# Patient Record
Sex: Female | Born: 1980 | Race: Black or African American | Hispanic: No | Marital: Married | Smoking: Never smoker
Health system: Southern US, Community
[De-identification: ages and names within clinical notes are randomized; demographics above are authoritative.]

## PROBLEM LIST (undated history)

## (undated) ENCOUNTER — Inpatient Hospital Stay (HOSPITAL_COMMUNITY): Payer: Self-pay

## (undated) DIAGNOSIS — E782 Mixed hyperlipidemia: Secondary | ICD-10-CM

## (undated) DIAGNOSIS — K920 Hematemesis: Secondary | ICD-10-CM

## (undated) DIAGNOSIS — E785 Hyperlipidemia, unspecified: Secondary | ICD-10-CM

## (undated) DIAGNOSIS — K449 Diaphragmatic hernia without obstruction or gangrene: Secondary | ICD-10-CM

## (undated) DIAGNOSIS — J45909 Unspecified asthma, uncomplicated: Secondary | ICD-10-CM

## (undated) DIAGNOSIS — R7303 Prediabetes: Secondary | ICD-10-CM

## (undated) DIAGNOSIS — N939 Abnormal uterine and vaginal bleeding, unspecified: Secondary | ICD-10-CM

## (undated) DIAGNOSIS — K219 Gastro-esophageal reflux disease without esophagitis: Secondary | ICD-10-CM

## (undated) DIAGNOSIS — D500 Iron deficiency anemia secondary to blood loss (chronic): Secondary | ICD-10-CM

## (undated) DIAGNOSIS — Z973 Presence of spectacles and contact lenses: Secondary | ICD-10-CM

## (undated) DIAGNOSIS — E7800 Pure hypercholesterolemia, unspecified: Secondary | ICD-10-CM

## (undated) DIAGNOSIS — J4530 Mild persistent asthma, uncomplicated: Secondary | ICD-10-CM

## (undated) DIAGNOSIS — E559 Vitamin D deficiency, unspecified: Secondary | ICD-10-CM

## (undated) DIAGNOSIS — R12 Heartburn: Secondary | ICD-10-CM

## (undated) DIAGNOSIS — D649 Anemia, unspecified: Secondary | ICD-10-CM

## (undated) DIAGNOSIS — T7840XA Allergy, unspecified, initial encounter: Secondary | ICD-10-CM

## (undated) HISTORY — DX: Unspecified asthma, uncomplicated: J45.909

## (undated) HISTORY — DX: Pure hypercholesterolemia, unspecified: E78.00

## (undated) HISTORY — PX: OTHER SURGICAL HISTORY: SHX169

## (undated) HISTORY — DX: Anemia, unspecified: D64.9

## (undated) HISTORY — DX: Allergy, unspecified, initial encounter: T78.40XA

## (undated) HISTORY — PX: CHOLECYSTECTOMY: SHX55

---
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
## 2002-08-19 ENCOUNTER — Other Ambulatory Visit: Admission: RE | Admit: 2002-08-19 | Discharge: 2002-08-19 | Payer: Self-pay | Admitting: Obstetrics and Gynecology

## 2003-06-26 DIAGNOSIS — Z87410 Personal history of cervical dysplasia: Secondary | ICD-10-CM

## 2003-06-26 HISTORY — DX: Personal history of cervical dysplasia: Z87.410

## 2003-09-06 ENCOUNTER — Other Ambulatory Visit: Admission: RE | Admit: 2003-09-06 | Discharge: 2003-09-06 | Payer: Self-pay | Admitting: Obstetrics and Gynecology

## 2003-10-26 ENCOUNTER — Ambulatory Visit (HOSPITAL_COMMUNITY): Admission: RE | Admit: 2003-10-26 | Discharge: 2003-10-26 | Payer: Self-pay | Admitting: Family Medicine

## 2003-12-14 ENCOUNTER — Ambulatory Visit (HOSPITAL_COMMUNITY): Admission: RE | Admit: 2003-12-14 | Discharge: 2003-12-14 | Payer: Self-pay | Admitting: Obstetrics and Gynecology

## 2003-12-14 ENCOUNTER — Ambulatory Visit (HOSPITAL_BASED_OUTPATIENT_CLINIC_OR_DEPARTMENT_OTHER): Admission: RE | Admit: 2003-12-14 | Discharge: 2003-12-14 | Payer: Self-pay | Admitting: Obstetrics and Gynecology

## 2003-12-14 HISTORY — PX: LASER ABLATION OF THE CERVIX: SHX1949

## 2004-02-10 ENCOUNTER — Emergency Department (HOSPITAL_COMMUNITY): Admission: EM | Admit: 2004-02-10 | Discharge: 2004-02-11 | Payer: Self-pay | Admitting: Emergency Medicine

## 2005-01-29 ENCOUNTER — Ambulatory Visit: Payer: Self-pay | Admitting: Family Medicine

## 2005-04-18 ENCOUNTER — Ambulatory Visit: Payer: Self-pay | Admitting: Family Medicine

## 2005-04-20 ENCOUNTER — Ambulatory Visit (HOSPITAL_COMMUNITY): Admission: RE | Admit: 2005-04-20 | Discharge: 2005-04-20 | Payer: Self-pay | Admitting: Family Medicine

## 2005-04-20 ENCOUNTER — Ambulatory Visit: Payer: Self-pay | Admitting: Family Medicine

## 2005-07-24 ENCOUNTER — Ambulatory Visit: Payer: Self-pay | Admitting: Family Medicine

## 2005-08-30 ENCOUNTER — Ambulatory Visit: Payer: Self-pay | Admitting: Family Medicine

## 2005-08-31 ENCOUNTER — Ambulatory Visit (HOSPITAL_COMMUNITY): Admission: RE | Admit: 2005-08-31 | Discharge: 2005-08-31 | Payer: Self-pay | Admitting: Family Medicine

## 2005-09-04 ENCOUNTER — Ambulatory Visit (HOSPITAL_COMMUNITY): Admission: RE | Admit: 2005-09-04 | Discharge: 2005-09-04 | Payer: Self-pay | Admitting: Family Medicine

## 2005-09-05 ENCOUNTER — Encounter (HOSPITAL_COMMUNITY): Admission: RE | Admit: 2005-09-05 | Discharge: 2005-10-05 | Payer: Self-pay | Admitting: Family Medicine

## 2005-09-17 ENCOUNTER — Ambulatory Visit: Payer: Self-pay | Admitting: Internal Medicine

## 2005-09-23 HISTORY — PX: ESOPHAGOGASTRODUODENOSCOPY: SHX1529

## 2005-09-24 ENCOUNTER — Ambulatory Visit: Payer: Self-pay | Admitting: Internal Medicine

## 2005-09-24 ENCOUNTER — Ambulatory Visit (HOSPITAL_COMMUNITY): Admission: RE | Admit: 2005-09-24 | Discharge: 2005-09-24 | Payer: Self-pay | Admitting: Internal Medicine

## 2005-10-24 ENCOUNTER — Ambulatory Visit: Payer: Self-pay | Admitting: Internal Medicine

## 2006-04-23 ENCOUNTER — Ambulatory Visit: Payer: Self-pay | Admitting: Family Medicine

## 2006-07-23 ENCOUNTER — Ambulatory Visit: Payer: Self-pay | Admitting: Family Medicine

## 2006-07-25 ENCOUNTER — Ambulatory Visit: Payer: Self-pay | Admitting: Family Medicine

## 2006-09-23 ENCOUNTER — Ambulatory Visit: Payer: Self-pay | Admitting: Family Medicine

## 2007-01-23 ENCOUNTER — Ambulatory Visit: Payer: Self-pay | Admitting: Family Medicine

## 2007-02-13 ENCOUNTER — Ambulatory Visit: Payer: Self-pay | Admitting: Family Medicine

## 2007-03-10 ENCOUNTER — Ambulatory Visit: Payer: Self-pay | Admitting: Family Medicine

## 2007-03-12 ENCOUNTER — Encounter: Payer: Self-pay | Admitting: Family Medicine

## 2007-05-19 ENCOUNTER — Ambulatory Visit: Payer: Self-pay | Admitting: Family Medicine

## 2007-07-08 ENCOUNTER — Ambulatory Visit: Payer: Self-pay | Admitting: Family Medicine

## 2007-07-14 ENCOUNTER — Ambulatory Visit: Payer: Self-pay | Admitting: Orthopedic Surgery

## 2007-07-14 DIAGNOSIS — M6740 Ganglion, unspecified site: Secondary | ICD-10-CM | POA: Insufficient documentation

## 2007-07-14 DIAGNOSIS — M654 Radial styloid tenosynovitis [de Quervain]: Secondary | ICD-10-CM | POA: Insufficient documentation

## 2007-07-28 ENCOUNTER — Ambulatory Visit: Payer: Self-pay | Admitting: Orthopedic Surgery

## 2008-01-19 ENCOUNTER — Ambulatory Visit: Payer: Self-pay | Admitting: Orthopedic Surgery

## 2008-01-19 DIAGNOSIS — M25549 Pain in joints of unspecified hand: Secondary | ICD-10-CM | POA: Insufficient documentation

## 2008-02-04 ENCOUNTER — Ambulatory Visit: Payer: Self-pay | Admitting: Orthopedic Surgery

## 2008-02-05 ENCOUNTER — Telehealth: Payer: Self-pay | Admitting: Family Medicine

## 2008-02-25 ENCOUNTER — Ambulatory Visit: Payer: Self-pay | Admitting: Orthopedic Surgery

## 2008-02-25 ENCOUNTER — Telehealth: Payer: Self-pay | Admitting: Family Medicine

## 2008-03-04 ENCOUNTER — Ambulatory Visit (HOSPITAL_COMMUNITY): Admission: RE | Admit: 2008-03-04 | Discharge: 2008-03-04 | Payer: Self-pay | Admitting: Orthopedic Surgery

## 2008-03-04 ENCOUNTER — Telehealth: Payer: Self-pay | Admitting: Orthopedic Surgery

## 2008-03-10 ENCOUNTER — Ambulatory Visit: Payer: Self-pay | Admitting: Orthopedic Surgery

## 2008-03-10 DIAGNOSIS — M65849 Other synovitis and tenosynovitis, unspecified hand: Secondary | ICD-10-CM

## 2008-03-10 DIAGNOSIS — M65839 Other synovitis and tenosynovitis, unspecified forearm: Secondary | ICD-10-CM | POA: Insufficient documentation

## 2008-03-17 ENCOUNTER — Telehealth: Payer: Self-pay | Admitting: Orthopedic Surgery

## 2008-03-17 ENCOUNTER — Ambulatory Visit: Payer: Self-pay | Admitting: Orthopedic Surgery

## 2008-03-21 ENCOUNTER — Emergency Department (HOSPITAL_COMMUNITY): Admission: EM | Admit: 2008-03-21 | Discharge: 2008-03-22 | Payer: Self-pay | Admitting: Emergency Medicine

## 2008-03-26 ENCOUNTER — Ambulatory Visit: Payer: Self-pay | Admitting: Family Medicine

## 2008-04-08 ENCOUNTER — Ambulatory Visit: Payer: Self-pay | Admitting: Orthopedic Surgery

## 2008-05-10 ENCOUNTER — Telehealth: Payer: Self-pay | Admitting: Orthopedic Surgery

## 2008-06-14 ENCOUNTER — Telehealth: Payer: Self-pay | Admitting: Family Medicine

## 2008-07-08 ENCOUNTER — Telehealth: Payer: Self-pay | Admitting: Family Medicine

## 2008-07-09 ENCOUNTER — Ambulatory Visit: Payer: Self-pay | Admitting: Family Medicine

## 2008-07-09 LAB — CONVERTED CEMR LAB
BUN: 11 mg/dL (ref 6–23)
Basophils Absolute: 0 10*3/uL (ref 0.0–0.1)
CO2: 27 meq/L (ref 19–32)
Creatinine, Ser: 0.91 mg/dL (ref 0.40–1.20)
Eosinophils Absolute: 0 10*3/uL (ref 0.0–0.7)
Eosinophils Relative: 0 % (ref 0–5)
Lymphocytes Relative: 11 % — ABNORMAL LOW (ref 12–46)
Lymphs Abs: 0.7 10*3/uL (ref 0.7–4.0)
MCV: 76.2 fL — ABNORMAL LOW (ref 78.0–100.0)
Neutrophils Relative %: 76 % (ref 43–77)
Platelets: 213 10*3/uL (ref 150–400)
RDW: 14.7 % (ref 11.5–15.5)
WBC: 6.4 10*3/uL (ref 4.0–10.5)

## 2008-07-13 ENCOUNTER — Ambulatory Visit: Payer: Self-pay | Admitting: Family Medicine

## 2008-07-13 LAB — CONVERTED CEMR LAB
Blood in Urine, dipstick: NEGATIVE
Nitrite: NEGATIVE
Protein, U semiquant: 100
Specific Gravity, Urine: >=1.03
WBC Urine, dipstick: NEGATIVE

## 2008-07-14 ENCOUNTER — Encounter: Payer: Self-pay | Admitting: Family Medicine

## 2008-08-05 ENCOUNTER — Ambulatory Visit: Payer: Self-pay | Admitting: Family Medicine

## 2008-08-05 DIAGNOSIS — R5381 Other malaise: Secondary | ICD-10-CM | POA: Insufficient documentation

## 2008-08-05 DIAGNOSIS — R5383 Other fatigue: Secondary | ICD-10-CM

## 2008-08-16 ENCOUNTER — Telehealth: Payer: Self-pay | Admitting: Family Medicine

## 2008-08-24 ENCOUNTER — Encounter: Payer: Self-pay | Admitting: Family Medicine

## 2008-11-08 ENCOUNTER — Ambulatory Visit: Payer: Self-pay | Admitting: Family Medicine

## 2008-11-09 DIAGNOSIS — R21 Rash and other nonspecific skin eruption: Secondary | ICD-10-CM | POA: Insufficient documentation

## 2008-11-15 ENCOUNTER — Telehealth: Payer: Self-pay | Admitting: Family Medicine

## 2009-01-28 ENCOUNTER — Ambulatory Visit: Payer: Self-pay | Admitting: Family Medicine

## 2009-01-28 DIAGNOSIS — E669 Obesity, unspecified: Secondary | ICD-10-CM | POA: Insufficient documentation

## 2009-01-28 DIAGNOSIS — J019 Acute sinusitis, unspecified: Secondary | ICD-10-CM | POA: Insufficient documentation

## 2009-01-28 DIAGNOSIS — J209 Acute bronchitis, unspecified: Secondary | ICD-10-CM | POA: Insufficient documentation

## 2009-01-29 ENCOUNTER — Encounter: Payer: Self-pay | Admitting: Family Medicine

## 2009-01-31 LAB — CONVERTED CEMR LAB
BUN: 11 mg/dL (ref 6–23)
Calcium: 9.1 mg/dL (ref 8.4–10.5)
Cholesterol: 179 mg/dL (ref 0–200)
Glucose, Bld: 74 mg/dL (ref 70–99)
LDL Cholesterol: 122 mg/dL — ABNORMAL HIGH (ref 0–99)
Total CHOL/HDL Ratio: 4.2
VLDL: 14 mg/dL (ref 0–40)

## 2009-07-18 ENCOUNTER — Telehealth: Payer: Self-pay | Admitting: Orthopedic Surgery

## 2009-07-19 ENCOUNTER — Ambulatory Visit: Payer: Self-pay | Admitting: Orthopedic Surgery

## 2009-07-22 ENCOUNTER — Ambulatory Visit: Payer: Self-pay | Admitting: Orthopedic Surgery

## 2009-07-22 ENCOUNTER — Ambulatory Visit (HOSPITAL_COMMUNITY): Admission: RE | Admit: 2009-07-22 | Discharge: 2009-07-22 | Payer: Self-pay | Admitting: Orthopedic Surgery

## 2009-07-22 ENCOUNTER — Encounter (INDEPENDENT_AMBULATORY_CARE_PROVIDER_SITE_OTHER): Payer: Self-pay | Admitting: *Deleted

## 2009-07-22 HISTORY — PX: WRIST GANGLION EXCISION: SUR520

## 2009-07-26 ENCOUNTER — Ambulatory Visit: Payer: Self-pay | Admitting: Orthopedic Surgery

## 2009-08-09 ENCOUNTER — Ambulatory Visit: Payer: Self-pay | Admitting: Orthopedic Surgery

## 2009-08-16 ENCOUNTER — Encounter: Payer: Self-pay | Admitting: Orthopedic Surgery

## 2009-09-09 ENCOUNTER — Encounter: Payer: Self-pay | Admitting: Orthopedic Surgery

## 2009-09-29 ENCOUNTER — Encounter: Payer: Self-pay | Admitting: Orthopedic Surgery

## 2009-10-03 ENCOUNTER — Telehealth: Payer: Self-pay | Admitting: Orthopedic Surgery

## 2009-10-05 ENCOUNTER — Ambulatory Visit: Payer: Self-pay | Admitting: Orthopedic Surgery

## 2010-01-02 ENCOUNTER — Telehealth: Payer: Self-pay | Admitting: Family Medicine

## 2010-01-17 ENCOUNTER — Ambulatory Visit: Payer: Self-pay | Admitting: Gastroenterology

## 2010-01-17 DIAGNOSIS — R1310 Dysphagia, unspecified: Secondary | ICD-10-CM | POA: Insufficient documentation

## 2010-01-17 DIAGNOSIS — K219 Gastro-esophageal reflux disease without esophagitis: Secondary | ICD-10-CM | POA: Insufficient documentation

## 2010-01-18 ENCOUNTER — Encounter: Payer: Self-pay | Admitting: Internal Medicine

## 2010-01-23 HISTORY — PX: ESOPHAGOGASTRODUODENOSCOPY: SHX1529

## 2010-02-02 ENCOUNTER — Ambulatory Visit: Payer: Self-pay | Admitting: Internal Medicine

## 2010-02-02 ENCOUNTER — Ambulatory Visit (HOSPITAL_COMMUNITY): Admission: RE | Admit: 2010-02-02 | Discharge: 2010-02-02 | Payer: Self-pay | Admitting: Internal Medicine

## 2010-05-11 ENCOUNTER — Encounter: Payer: Self-pay | Admitting: Gastroenterology

## 2010-07-25 NOTE — Letter (Signed)
Summary: [employer] form  [employer] form   Imported By: Cammie Sickle 08/17/2009 15:11:11  _____________________________________________________________________  External Attachment:    Type:   Image     Comment:   External Document

## 2010-07-25 NOTE — Letter (Signed)
Summary: Work [facility]  [address]  [place], [place] [address]   Phone: [phone]  Fax: [phone]     Today's Date: October 05, 2009  Name of Patient: Martha [name]  The above named patient had a medical visit today.  Please take this into consideration when reviewing the time away from work/school.    Special Instructions:   Arly.Keller ] To be off the remainder of today, returning to the normal work / school schedule tomorrow, 10/06/09 .  [  ] To be off until the next scheduled appointment on ______________________.  [  ] Other ________________________________________________________________ ________________________________________________________________________   Sincerely,   Terrance Mass, MD

## 2010-07-25 NOTE — Assessment & Plan Note (Signed)
Summary: reflux,vomiting/PCP WANTS ASAP APPT/SS   Visit Type:  f/u Primary Care Provider:  Lodema Hong  Chief Complaint:  reflux, nausea, and vomiting.  History of Present Illness: Martha [name] is here for further evaluation of N/V, GERD. She was last seen here in 5/07. She notes worsening symptoms for several months. She has tried OTC Zantac. She has heartburn all the time. She wakes up in the middle of night with heartburn. If she belches the fluid comes up. Last month, she had frequent n/v. Usually vomiting after meals of undigested food. She c/o early satiety at times. Some difficulty swallowing solid foods. Denies abd pain. BM at least 5 times per week but some straining associated with occasional brbpr on the toilet tissue. No recent medication changes. Rarely takes BC powder for H/A.    Current Medications (verified): 1)  Camila 0.35 Mg Tabs (Norethindrone (Contraceptive)) .... Take 1 Tablet By Mouth Once A Day  Allergies (verified): No Known Drug Allergies  Past History:  Past Medical History: EGD, 4/07,small hiatal hernia  Past Surgical History: laser of cervix laparoscopic surgery to r/o endometriosis  Family History: FH of Cancer:  Hx, family, asthma Family History of Diabetes both parents are living, mother has hyperlipidemia, GERD, PUD, father has myasthenia No FH of CRC.  Social History: Patient is single.  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 Eyes:  Denies vision loss. ENT:  Complains of difficulty swallowing; denies nasal congestion, sore throat, and hoarseness. CV:  Denies chest pains, angina, palpitations, dyspnea on exertion, and peripheral edema. Resp:  Denies dyspnea at rest, dyspnea with exercise, cough, sputum, and wheezing. GI:  See HPI. GU:  Denies urinary burning and blood in urine. MS:  Denies joint pain / LOM. Derm:   Denies rash and itching. Neuro:  Denies weakness, frequent headaches, memory loss, and confusion. Psych:  Denies depression and anxiety. Endo:  Denies unusual weight change. Heme:  Denies bruising and bleeding. Allergy:  Denies hives and rash.  Vital Signs:  Patient profile:   30 year old female Menstrual status:  regular Height:      65 inches Weight:      201 pounds BMI:     33.57 Temp:     98.3 degrees F oral Pulse rate:   80 / minute BP sitting:   108 / 80  (left arm) Cuff size:   regular  Vitals Entered By: Hendricks Limes LPN (January 17, 2010 11:43 AM)  Physical Exam  General:  Well developed, well nourished, no acute distress. Head:  Normocephalic and atraumatic. Eyes:  Conjunctivae pink, no scleral icterus.  Mouth:  Oropharyngeal mucosa moist, pink.  No lesions, erythema or exudate.    Neck:  Supple; no masses or thyromegaly. Lungs:  Clear throughout to auscultation. Heart:  Regular rate and rhythm; no murmurs, rubs,  or bruits. Abdomen:  Mild epig tenderness. Normal BS. No HSM or masses. No rebound or guarding. No abd hernia or bruit.  Extremities:  No clubbing, cyanosis, edema or deformities noted. Neurologic:  Alert and  oriented x4;  grossly normal neurologically. Skin:  Intact without significant lesions or rashes. Cervical Nodes:  No significant cervical adenopathy. Psych:  Alert and cooperative. Normal mood and affect.  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 Only on H2 blockers lately. Remotely took Nexium. Suspect symptoms would be  more manageable on PPI and likely dysphagia would improve. Last EGD four years ago. Patient interested in pursuing EGD at this time. EGD/ED to be performed in near future.  Risks, alternatives, benefits including but not limited to risk of reaction to medications, bleeding, infection, and perforation addressed.  Patient voiced understanding and verbal consent  obtained.   Begin Dexilant 60mg  by mouth daily. #14 samples provided. Antireflux measures addressed.   Orders: New Patient Level III [phone])

## 2010-07-25 NOTE — Assessment & Plan Note (Signed)
Summary: EVAL,TREAT/GANGLION CYST RT/?XRAY,HAD MRI/[employer]/CAF   Visit Type:  Follow-up  CC:  rigth wrist pain and mass.  History of Present Illness: She is a 30 years old woman with the complaint of:  knot on right hand. The knot has been there since 07-06-07, no injury. She says that it is sore to the touch. Complaints: has been having pain for around 2 years, no injury, still has the mass.  She  tried wearing a brace at night, the  brace makes her cyst hurt, she takes Tylenol and this helps minimally, The pain is intermitttent. The mass is enlarging.   She declined aspiration.   Treatment:  cast, Relafen, brace, injection    MRI right wrist 03/04/2008.   Current Medications (verified): 1)  Camila 0.35 Mg Tabs (Norethindrone (Contraceptive)) .... Take 1 Tablet By Mouth Once A Day 2)  Penicillin V Potassium 500 Mg Tabs (Penicillin V Potassium) .... 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 MS:  See HPI.  The review of systems is negative for General, Cardiac , Resp, GI, GU, Endo, Psych, Derm, EENT, Immunology, and Lymphatic.  Physical Exam  Additional Exam:  Examination today reveals an awake alert female oriented x3 pleasant mood   normal appearance excellent hygiene and grooming.   Pulse/ perfusion to the right upper extremity normal.  Skin  normal see below  Lymph system normal   The swelling in the hand in terms of the ganglion has increased. There is tenderness over the mass and the radial aspect of the distal radius.   There is some diminished wrist motion and pain with extension of the wrist. The grip strength is normal and the wrist is stable,. The wriat is aligned normally.    Impression & Recommendations:  Problem # 1:  GANGLION-HAND/WRIST (ICD-727.43) Assessment Deteriorated DATA: IMPRESSION:   1.  Three small ganglion cysts are identified as noted above, although the two dorsal cysts may conceivably be connected. 2.  Tenosynovitis of the extensor carpi radialis tendons.  The first extensor compartment appears unremarkable, without specific evidence of DeQuervain's  tenosynovitis. 3.  A tear of the lunate side of the scapholunate ligament is raised as a possibility given the faint linear edema signal in this location. Assessment:   Failed to respond to non operative treatment. Now has increasing pain and emlarging mass.   Right thumb, cyst excision   Orders: Est. Patient Level IV ([address])  Patient Instructions: 1)  DOS 07/22/09 2)  PREOP at [facility] short stay center tomorrow 07/20/09 at 2:40pm, take packet with you 3)  POSTOP 07/26/09 in our office 4)    5)  Informed consent process: I have discussed the procedure with the patient. I have answered their questions. The risks of bleeding, infection, nerve and vascualr injury have been discussed. The diagnosis and reason for surgery have been explained. The patient demonstrates understanding of this discussion. Specific to this procedure risks include:  6)  20 % recur  7)  pain at the incision 8)  numbness  in the thumb

## 2010-07-25 NOTE — Miscellaneous (Signed)
Summary: call to insurer, no pre-cert required out-patient procedure  Clinical Lists Changes    Contacted insurer [employer] re: out-patient procedure scheduled 07/22/09 [facility]                 CPT [phone]; per Dara, no pre-cert is required

## 2010-07-25 NOTE — Assessment & Plan Note (Signed)
Summary: POST OP 2/SUT REM/RT HAND/SURG 07/22/09/[employer]/CAF   Visit Type:  Follow-up  CC:  post op 2.  History of Present Illness: I saw Martha [name] in the office today for a followup visit.  She is a 30 years old woman with the complaint of:  DOS 07/22/09 right wrist.  Procedure Excision of ganglion cyst, right wrist.  Medication Ibuprofen 800mg  as needed.  Today for suture removal.  Sutures are cut at the skin edges her wound looks a little bit red.  She got some over-the-counter Steri-Strips and irritated the skin.  Recommend vitamin E cream or cocoa butter or Vaseline over the wound continue range of motion exercises follow up as needed      Allergies: No Known Drug Allergies   Impression & Recommendations:  Problem # 1:  GANGLION-HAND/WRIST (ICD-727.43)  Orders: Post-Op Check (57[address])  Problem # 2:  AFTERCARE FOLLOW SURGERY MUSCULOSKEL SYSTEM NEC (ICD-V58.78)  Orders: Post-Op Check (02542)  Patient Instructions: 1)  move fingers as tolerated  2)  Please schedule a follow-up appointment as needed.

## 2010-07-25 NOTE — Assessment & Plan Note (Signed)
Summary: RE-CK HAND/HURTING/POST OP 07/22/09/[employer]/CAF   Visit Type:  Follow-up  CC:  post op.  History of Present Illness: I saw Martha [name] in the office today for a followup visit.  She is a 30 years old woman with the complaint of:  DOS 07/22/09 right wrist.  Procedure Excision of ganglion cyst, right wrist. This was over the thumb. The thumb is not hurting her at all. She is having some pain over the dorsum of the wrist has a history of previous ligament injury and a cyst was noted. There in the past.  Medication Ibuprofen 800mg  as needed.  Today she is complaining of pain for 2 weeks, no injury.  She has pain flexing the wrist. There is also some throbbing  Has numbness right index, middle and ring fingers.  exam shows incision over the thumb, which is healed nicely. No tenderness, negative Finkelstein  Pain with flexion of the wrist and tenderness over the dorsal wrist joint. She does have some decreased sensation dorsally.  Impression wrist pain, cause unknown seems to be over activity. Recommend bracing, anti-inflammatory check in 3 weeks         Allergies: No Known Drug Allergies   Impression & Recommendations:  Problem # 1:  TENDINITIS, RIGHT WRIST (ICD-727.05) Assessment Deteriorated  Orders: Est. Patient Level III ([address])  Patient Instructions: 1)  Brace for 3 weeks  2)  Take aleve two times a day x 3 weeks  3)  f/u 3 weeks

## 2010-07-25 NOTE — Letter (Signed)
Summary: Work [facility]  [address]  [place], [place] [address]   Phone: [phone]  Fax: [phone]    Today's Date: July 19, 2009  Name of Patient: Martha [name]  The above named patient had a medical visit today at:  4:30 pm.  Please take this into consideration when reviewing the time away from work.   Special Instructions:   [ X  ] To be off the remainder of today, returning to the work tomorrow, 07/20/09.  Please note will need to be excused for medical/pre-operative appointment scheduled for 2:40pm, and returning to regular work schedule on 07/21/09   [ X ] Other _______ Please excuse from work 07/22/09, secondary to surgery.  Return to work as tolerated on:    07/25/09   Sincerely yours,   Terrance Mass, MD

## 2010-07-25 NOTE — Letter (Signed)
Summary: Medical record request [employer]  Medical record request [employer]   Imported By: Cammie Sickle 09/13/2009 18:57:15  _____________________________________________________________________  External Attachment:    Type:   Image     Comment:   External Document

## 2010-07-25 NOTE — Progress Notes (Signed)
Summary: move appt up sooner  Phone Note Call from Patient   Summary of Call: pt has appt for endo at dr. Jeanella Flattery office for aug 8th. pt mother worried cause she is vomiting and reflux is bad and just had a friend who passed away with some problems.   she wanted to know if you could get the endo schedule sooner. she leaves to go out of town wed.  Initial call taken by: Rudene Anda,  January 02, 2010 11:18 AM  Follow-up for Phone Call        I will send dr Kendell Bane a flag requesting this , let her know i am trying Follow-up by: Syliva Overman MD,  January 02, 2010 12:11 PM  Additional Follow-up for Phone Call Additional follow up Details #1::        called left message to call me back at office Additional Follow-up by: Rudene Anda,  January 02, 2010 1:57 PM    Additional Follow-up for Phone Call Additional follow up Details #2::    pt called back and was thankful for doc doing this for her.  Follow-up by: Rudene Anda,  January 02, 2010 3:11 PM   Appended Document: move appt up sooner PT HAS APPT TODAY WITH [facility] AT 1130

## 2010-07-25 NOTE — Letter (Signed)
Summary: Out of Work  [facility]  [address]  [place], [place] [address]   Phone: [phone]  Fax: [phone]    September 29, 2009  RE:  OUT OF WORK DATES    Employee:  [employer] [facility]   To Whom It May Concern:   For Medical reasons, please excuse the above named employee from work for the following dates:  August 25, 2009 /  August 26, 2009 / August 29, 2009 / August 30, 2009  End/Return to Work:     August 31, 2009   If you need additional information, please feel free to contact our office.         Sincerely,    Terrance Mass, MD

## 2010-07-25 NOTE — Progress Notes (Signed)
Summary: call from patient and appointment   Phone Note Call from Patient   Caller: Patient Summary of Call: Patient returned call from Dr Romeo Apple re: eval/treat ganglion cyst, and re: appt this week.  I have scheduled patient for tomorrow 07/19/09. Initial call taken by: Cammie Sickle,  July 18, 2009 10:20 AM

## 2010-07-25 NOTE — Assessment & Plan Note (Signed)
Summary: POST OP 1/SURG RT THUMB 07/22/09/[employer]/CAF   Visit Type:  Follow-up  CC:  post op 1 right wrist ganglion.  History of Present Illness: I saw Martha [name] in the office today for a followup visit.  She is a 30 years old woman with the complaint of:  DOS 07/22/09 right wrist.  Procedure Excision of ganglion cyst, right wrist.  Medication Norco 5 , no Ibuprofen. Has some pain.  doing well has some soreness  Path report came back ganglion cyst  Wound looks fine.  Apply Steri-Strips  Okay to get wet reapply strips as needed for one week and come back 2 weeks for sutures to be cut at the skin edge.  Encourage active range of motion  Allergies: No Known Drug Allergies   Other Orders: Post-Op Check ([address])  Patient Instructions: 1)  Ibuprofen 800 mg three times a day  2)  2 weeks to get the suture cut

## 2010-07-25 NOTE — Letter (Signed)
Summary: egd order  egd order   Imported By: Hendricks Limes LPN 29/52/8413 24:40:10  _____________________________________________________________________  External Attachment:    Type:   Image     Comment:   External Document

## 2010-07-25 NOTE — Medication Information (Signed)
Summary: NEXIUM  NEXIUM   Imported By: Rexene Alberts 05/11/2010 09:08:12  _____________________________________________________________________  External Attachment:    Type:   Image     Comment:   External Document  Appended Document: omeprazole    Prescriptions: OMEPRAZOLE 20 MG CPDR (OMEPRAZOLE) one by mouth 30 mins before breakfast daily  #30 x 11   Entered and Authorized by:   Leanna Battles. Dixon Boos   Signed by:   Leanna Battles Lewis PA-C on 05/11/2010   Method used:   Electronically to        [facility] [phone]* (retail)       [address]       [place], [place]  [address]       Ph: [phone] or [phone]       Fax: [phone]   RxID:   [id number]

## 2010-07-25 NOTE — Progress Notes (Signed)
Summary: call from patient hand hurting  Phone Note Call from Patient   Caller: Patient Summary of Call: Patient called with question about RT hand, had surgery 07/22/09. 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  Ph [phone] (Cell).  Please advise, appt or other recommendation. Initial call taken by: Cammie Sickle,  October 03, 2009 10:05 AM  Follow-up for Phone Call        appt Follow-up by: Ether Griffins,  October 03, 2009 10:27 AM  Additional Follow-up for Phone Call Additional follow up Details #1::        advised and scheduled. Additional Follow-up by: Cammie Sickle,  October 03, 2009 11:20 AM

## 2010-07-29 ENCOUNTER — Encounter: Payer: Self-pay | Admitting: Family Medicine

## 2010-08-07 ENCOUNTER — Telehealth: Payer: Self-pay | Admitting: Family Medicine

## 2010-08-08 ENCOUNTER — Encounter: Payer: Self-pay | Admitting: Family Medicine

## 2010-08-08 ENCOUNTER — Ambulatory Visit (INDEPENDENT_AMBULATORY_CARE_PROVIDER_SITE_OTHER): Payer: BC Managed Care – PPO | Admitting: Family Medicine

## 2010-08-08 DIAGNOSIS — N920 Excessive and frequent menstruation with regular cycle: Secondary | ICD-10-CM | POA: Insufficient documentation

## 2010-08-08 DIAGNOSIS — E669 Obesity, unspecified: Secondary | ICD-10-CM

## 2010-08-08 DIAGNOSIS — J209 Acute bronchitis, unspecified: Secondary | ICD-10-CM

## 2010-08-08 DIAGNOSIS — J0100 Acute maxillary sinusitis, unspecified: Secondary | ICD-10-CM

## 2010-08-08 DIAGNOSIS — M79609 Pain in unspecified limb: Secondary | ICD-10-CM | POA: Insufficient documentation

## 2010-08-08 DIAGNOSIS — G4730 Sleep apnea, unspecified: Secondary | ICD-10-CM | POA: Insufficient documentation

## 2010-08-08 DIAGNOSIS — M542 Cervicalgia: Secondary | ICD-10-CM | POA: Insufficient documentation

## 2010-08-09 ENCOUNTER — Encounter: Payer: Self-pay | Admitting: Family Medicine

## 2010-08-09 DIAGNOSIS — D649 Anemia, unspecified: Secondary | ICD-10-CM

## 2010-08-09 HISTORY — DX: Anemia, unspecified: D64.9

## 2010-08-10 ENCOUNTER — Telehealth (INDEPENDENT_AMBULATORY_CARE_PROVIDER_SITE_OTHER): Payer: Self-pay | Admitting: *Deleted

## 2010-08-15 ENCOUNTER — Encounter: Payer: Self-pay | Admitting: Family Medicine

## 2010-08-16 NOTE — Assessment & Plan Note (Signed)
Summary: sick   Vital Signs:  Patient profile:   30 year old female Menstrual status:  regular Height:      65 inches Weight:      206 pounds BMI:     34.40 O2 Sat:      97 % Temp:     98.8 degrees F oral Pulse rate:   80 / minute Pulse rhythm:   regular Resp:     16 per minute BP sitting:   110 / 80  (left arm) Cuff size:   regular  Vitals Entered By: Everitt Amber LPN (August 08, 2010 9:00 AM)  Nutrition Counseling: Patient's BMI is greater than 25 and therefore counseled on weight management options. CC: c/o cough, nasal congestion, greenish/blood tinged phlegm x 1 week    CC:  c/o cough, nasal congestion, and greenish/blood tinged phlegm x 1 week .  Current Medications (verified): 1)  Camila 0.35 Mg Tabs (Norethindrone (Contraceptive)) .... Take 1 Tablet By Mouth Once A Day 2)  Nexium 20 Mg Cpdr (Esomeprazole Magnesium) .... Take 1 Tablet By Mouth Once A Day  Allergies (verified): No Known Drug Allergies  Past History:  Past medical, surgical, family and social histories (including risk factors) reviewed, and no changes noted (except as noted below).  Past Medical History: EGD, 4/07,small hiatal hernia anemia 2012 menorrhagia 2012 obesity  Past Surgical History: Reviewed history from 01/17/2010 and no changes required. laser of cervix laparoscopic surgery to r/o endometriosis  Family History: Reviewed history from 01/17/2010 and no changes required. FH of Cancer:  Hx, family, asthma Family History of Diabetes both parents are living, mother has hyperlipidemia, GERD, PUD, father has myasthenia No FH of CRC.  Social History: Reviewed history from 01/17/2010 and no changes required. 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 Eyes:  Denies discharge and red eye. ENT:   Complains of postnasal drainage, sinus pressure, and sore throat; 6 day history of yellow nasal drainage. Resp:  Denies cough and sputum productive. GI:  Complains of constipation; chronic. GU:  Complains of abnormal vaginal bleeding; nemorrhagia, flooding and clots x 4 months, irreg cycles, bleeds on avg every 2 months. MS:  Complains of joint pain and stiffness; right hand pain and stiffnes x 2 plus years, worse this past weekend , unable to make a fist. Neuro:  Complains of headaches; sinus headache over the past weekend. Heme:  Denies abnormal bruising, bleeding, enlarge lymph nodes, and fevers. Allergy:  Complains of seasonal allergies; denies hives or rash and itching eyes; was started on immunotherapy , but due to cost discontinued same.  Physical Exam  General:  Well-developed,obese,in no acute distress; alert,appropriate and cooperative throughout examination.Ill appearing. HEENT: No facial asymmetry,  EOMI, maxillary sinus tenderness, TM's Clear, oropharynx  pink and moist. Nasal mucosa erythematous and edematous  Chest: decreased air entry, scattered crackles, no wheezes CVS: S1, S2, No murmurs, No S3.   Abd: Soft, Nontender.  MS: Adequate ROM spine, hips, shoulders and knees.Right wrist tender with reduced mobility in particular of the thmb, with tenderness over the tendon  Ext: No edema.   CNS: CN 2-12 intact, power tone and sensation normal throughout.   Skin: Intact, no visible lesions or rashes.  Psych: Good eye contact, normal affect.  Memory intact, not anxious or depressed appearing.    Impression & Recommendations:  Problem # 1:  ACUTE MAXILLARY SINUSITIS (ICD-461.0) Assessment Comment Only  Her updated medication list for this problem includes:    Penicillin V Potassium 500 Mg Tabs (Penicillin v potassium) .Marland Kitchen... Take 1 tablet by mouth three times a day  Orders: Depo- Medrol 80mg  (J1040) Rocephin  250mg  (Z6109) Admin of Therapeutic Inj  intramuscular or  subcutaneous (60454)  Problem # 2:  ACUTE BRONCHITIS (ICD-466.0) Assessment: Comment Only  Her updated medication list for this problem includes:    Penicillin V Potassium 500 Mg Tabs (Penicillin v potassium) .Marland Kitchen... Take 1 tablet by mouth three times a day mucinex one daily for 6 days, samples given  Problem # 3:  HAND PAIN, RIGHT (ICD-729.5) Assessment: Deteriorated  Orders: Orthopedic Referral (Ortho), has been evaluated in the past by ortho dx as tenosynovitis  Problem # 4:  MENORRHAGIA (ICD-626.2) Assessment: Deteriorated  Her updated medication list for this problem includes:    Camila 0.35 Mg Tabs (Norethindrone (contraceptive)) .Marland Kitchen... Take 1 tablet by mouth once a day pt has new anemia, will need adjustment of her contraception, gynae referral  Problem # 5:  SLEEP APNEA (ICD-780.57) Assessment: Comment Only  Orders: Sleep Disorder Referral (Sleep Disorder), excessive fatigue and excessive snoring, reports waking up herself snoring  Problem # 6:  OBESITY, UNSPECIFIED (ICD-278.00) Assessment: Deteriorated  Ht: 65 (08/08/2010)   Wt: 206 (08/08/2010)   BMI: 34.40 (08/08/2010) therapeutic lifestyle change discussed and encouraged  Complete Medication List: 1)  Camila 0.35 Mg Tabs (Norethindrone (contraceptive)) .... Take 1 tablet by mouth once a day 2)  Nexium 20 Mg Cpdr (Esomeprazole magnesium) .... Take 1 tablet by mouth once a day 3)  Penicillin V Potassium 500 Mg Tabs (Penicillin v potassium) .... Take 1 tablet by mouth three times a day 4)  Prednisone (pak) 5 Mg Tabs (Prednisone) .... Use as directed 5)  Loratidine 10mg   .... Take 1 tablet by mouth once a day 6)  Fluconazole 150 Mg Tabs (Fluconazole) .... Take 1 tablet by mouth once a day as needed for vaginal itch  Other Orders: Gynecologic Referral (Gyn)  Patient Instructions: 1)  Please schedule a follow-up appointment in 4.5 months. 2)  It is important that you exercise regularly at least 30 minutes 5 times a  week. If you develop chest pain, have severe difficulty breathing, or feel very tired , stop exercising immediately and seek medical attention. 3)  You need to lose weight. Consider a lower calorie diet and regular exercise.  4)  you are being treated for sinusitis and bronchitis, injections in th office today, also meds will be given and sent to your pharmacy. 5)  three referrals as discussed Prescriptions: FLUCONAZOLE 150 MG TABS (FLUCONAZOLE) Take 1 tablet by mouth once a day as needed for vaginal itch  #3 x 0   Entered and Authorized by:   Syliva Overman MD   Signed by:   Syliva Overman MD on 08/08/2010   Method used:   Print then Give to Patient   RxID:   [id number] LORATIDINE 10MG  Take 1 tablet by mouth once a day  #90 x 1   Entered and Authorized by:   Syliva Overman MD   Signed by:   Syliva Overman MD on 08/08/2010   Method used:   Print then Give to Patient   RxID:   [id number] PREDNISONE (PAK) 5 MG TABS (PREDNISONE) Use as directed  #21 x 0   Entered and Authorized by:   Syliva Overman MD   Signed by:  Syliva Overman MD on 08/08/2010   Method used:   [facility] to        [facility]  [phone]* (retail)       [address] [place], [place]  [address]       Ph: [phone] or [phone]       Fax: [phone]   RxID:   [id number] PENICILLIN V POTASSIUM 500 MG TABS (PENICILLIN V POTASSIUM) Take 1 tablet by mouth three times a day  #30 x 0   Entered and Authorized by:   Syliva Overman MD   Signed by:   Syliva Overman MD on 08/08/2010   Method used:   [facility]        [facility]  [address]  [phone]* (retail)       [address] [place], [place]  [address]       Ph: [phone] or [phone]       Fax: [phone]   RxID:   [id number]    Medication Administration  Injection # 1:    Medication: Depo- Medrol 80mg     Diagnosis: ACUTE MAXILLARY SINUSITIS (ICD-461.0)    Route: IM    Site: RUOQ gluteus     Exp Date: 07/12    Lot #: Gunnar Bulla    Mfr: Pharmacia    Patient tolerated injection without complications    Given by: Adella Hare LPN (August 08, 2010 9:50 AM)  Injection # 2:    Medication: Rocephin  250mg     Diagnosis: ACUTE MAXILLARY SINUSITIS (ICD-461.0)    Route: IM    Site: LUOQ gluteus    Exp Date: 05/14    Lot #: [id number]    Mfr: [email or web address]    Comments: rocephin 500mg  given    Patient tolerated injection without complications    Given by: Adella Hare LPN (August 08, 2010 9:51 AM)  Orders Added: 1)  Sleep Disorder Referral [Sleep Disorder] 2)  Est. Patient Level IV [60737] 3)  Depo- Medrol 80mg  [J1040] 4)  Rocephin  250mg  [J0696] 5)  Admin of Therapeutic Inj  intramuscular or subcutaneous [96372] 6)  Gynecologic Referral [Gyn] 7)  Orthopedic Referral [Ortho]     Medication Administration  Injection # 1:    Medication: Depo- Medrol 80mg     Diagnosis: ACUTE MAXILLARY SINUSITIS (ICD-461.0)    Route: IM    Site: RUOQ gluteus    Exp Date: 07/12    Lot #: Gunnar Bulla    Mfr: Pharmacia    Patient tolerated injection without complications    Given by: Adella Hare LPN (August 08, 2010 9:50 AM)  Injection # 2:    Medication: Rocephin  250mg     Diagnosis: ACUTE MAXILLARY SINUSITIS (ICD-461.0)    Route: IM    Site: LUOQ gluteus    Exp Date: 05/14    Lot #: [id number]    Mfr: [email or web address]    Comments: rocephin 500mg  given    Patient tolerated injection without complications    Given by: Adella Hare LPN (August 08, 2010 9:51 AM)  Orders Added: 1)  Sleep Disorder Referral [Sleep Disorder] 2)  Est. Patient Level IV [48546] 3)  Depo- Medrol 80mg  [J1040] 4)  Rocephin  250mg  [J0696] 5)  Admin of Therapeutic Inj  intramuscular or subcutaneous [96372] 6)  Gynecologic Referral [Gyn] 7)  Orthopedic Referral [Ortho]  Appended Document: sick pls add anemia panel top labs drawn at urgent care on 07/29/2010 if possible  Appended Document: sick specimen  only held for 7 days at  most   Appended Document: Orders Update    Clinical Lists Changes  Problems: Added new problem of ANEMIA (ICD-285.9) Orders: Added new Test order of T- * Misc. Laboratory test [id number]) - Signed      Appended Document: sick notify pt pls that she needs additional blood draw and order anemia panel  Appended Document: sick already ordered and sent to lab and patient aware

## 2010-08-16 NOTE — Progress Notes (Signed)
Summary: neede information  Phone Note Call from Patient   Summary of Call: kelly from [facility] called and needed her information for the referral so [facility] faxed over the paper work to [phone]  Initial call taken by: Lind Guest,  August 10, 2010 9:29 AM

## 2010-08-16 NOTE — Progress Notes (Signed)
Summary: sick  Phone Note Call from Patient   Summary of Call: pt has a sinus infection and needs to see doc are either get something called in for it. [phone] Initial call taken by: Rudene Anda,  August 07, 2010 2:14 PM  Follow-up for Phone Call        chest congestion, green sputum, green nasal drainage losing voice x 4 days Follow-up by: Adella Hare LPN,  August 07, 2010 2:44 PM  Additional Follow-up for Phone Call Additional follow up Details #1::        patient has appt tomorrow Additional Follow-up by: Adella Hare LPN,  August 07, 2010 2:50 PM

## 2010-08-16 NOTE — Letter (Signed)
Summary: medical release  medical release   Imported By: Lind Guest 08/09/2010 09:43:33  _____________________________________________________________________  External Attachment:    Type:   Image     Comment:   External Document

## 2010-08-19 ENCOUNTER — Encounter: Payer: Self-pay | Admitting: Family Medicine

## 2010-08-23 ENCOUNTER — Emergency Department (HOSPITAL_COMMUNITY): Payer: BC Managed Care – PPO

## 2010-08-23 ENCOUNTER — Telehealth: Payer: Self-pay | Admitting: Family Medicine

## 2010-08-23 ENCOUNTER — Emergency Department (HOSPITAL_COMMUNITY)
Admission: EM | Admit: 2010-08-23 | Discharge: 2010-08-24 | Disposition: A | Discharge status: Home / Self Care | Payer: BC Managed Care – PPO | Attending: Emergency Medicine | Admitting: Emergency Medicine

## 2010-08-23 DIAGNOSIS — Z880 Allergy status to penicillin: Secondary | ICD-10-CM | POA: Insufficient documentation

## 2010-08-23 DIAGNOSIS — J40 Bronchitis, not specified as acute or chronic: Secondary | ICD-10-CM | POA: Insufficient documentation

## 2010-08-24 ENCOUNTER — Telehealth: Payer: Self-pay | Admitting: Family Medicine

## 2010-08-31 NOTE — Letter (Signed)
Summary: [facility]  [facility]   Imported By: Lind Guest 08/22/2010 13:59:19  _____________________________________________________________________  External Attachment:    Type:   Image     Comment:   External Document

## 2010-08-31 NOTE — Progress Notes (Signed)
Summary: pt still sick  Phone Note Call from Patient   Summary of Call: pt went to er last night and said she had bronchitis. pt no better today. would like to talk with nurse. [phone] Initial call taken by: Rudene Anda,  August 24, 2010 10:15 AM  Follow-up for Phone Call        what should i advise patient? i know you spoke with her yesterday and sent meds in Follow-up by: Adella Hare LPN,  August 24, 2010 10:17 AM  Additional Follow-up for Phone Call Additional follow up Details #1::        she neweds to get a work note and be out of work to return on Monday. She needs to take the medicationsc  prescribed. i will proveidea note. Pls send for ED records. If no better or worse over the weekend call back, if unable toreturn to work onmondat will need OV on Monday for re-eval Additional Follow-up by: Syliva Overman MD,  August 24, 2010 12:43 PM    Additional Follow-up for Phone Call Additional follow up Details #2::    patient aware Follow-up by: Adella Hare LPN,  August 24, 2010 1:12 PM

## 2010-08-31 NOTE — Progress Notes (Signed)
Summary: sick  Phone Note Call from Patient   Summary of Call: pt is still with congestion and sore throat. she was in the other week with the samething. can she [facility] [phone] Initial call taken by: Rudene Anda,  August 23, 2010 8:27 AM  Follow-up for Phone Call        sore throat, cough, head congestion, no fever, no chills or body aches, yellow sputum Follow-up by: Adella Hare LPN,  August 23, 2010 8:42 AM  Additional Follow-up for Phone Call Additional follow up Details #1::        pt states the diarreah 3 to 4 per day,she stopped the PCN after approx 4 days, she understands that septra has been sent in Additional Follow-up by: Syliva Overman MD,  August 23, 2010 12:13 PM   New Allergies: ! PENICILLIN V POTASSIUM (PENICILLIN V POTASSIUM) New/Updated Medications: SEPTRA DS 800-160 MG TABS (SULFAMETHOXAZOLE-TRIMETHOPRIM) Take 1 tablet by mouth two times a day New Allergies: ! PENICILLIN V POTASSIUM (PENICILLIN V POTASSIUM)Prescriptions: SEPTRA DS 800-160 MG TABS (SULFAMETHOXAZOLE-TRIMETHOPRIM) Take 1 tablet by mouth two times a day  #20 x 0   Entered and Authorized by:   Syliva Overman MD   Signed by:   Syliva Overman MD on 08/23/2010   Method used:   [facility] to        [facility]  [phone]* (retail)       [address] [place], [place]  [address]       Ph: [phone] or [phone]       Fax: [phone]   RxID:   [id number]  *

## 2010-09-10 LAB — BASIC METABOLIC PANEL
BUN: 7 mg/dL (ref 6–23)
CO2: 26 mEq/L (ref 19–32)
GFR calc non Af Amer: >60 mL/min (ref >60)
Glucose, Bld: 84 mg/dL (ref 70–99)
Sodium: 140 mEq/L (ref 135–145)

## 2010-09-10 LAB — HEMOGLOBIN AND HEMATOCRIT, BLOOD: Hemoglobin: 11 g/dL — ABNORMAL LOW (ref 12.0–15.0)

## 2010-09-11 ENCOUNTER — Encounter: Payer: Self-pay | Admitting: Gastroenterology

## 2010-09-21 NOTE — Medication Information (Signed)
Summary: PA for dexilant  PA for dexilant   Imported By: Hendricks Limes LPN 60/45/4098 11:91:47  _____________________________________________________________________  External Attachment:    Type:   Image     Comment:   External Document

## 2010-11-10 NOTE — Op Note (Signed)
NAME:  Martha [name], Martha [name]              ACCOUNT NO.:  [id number]   MEDICAL RECORD NO.:  [id number]          PATIENT TYPE:  AMB   LOCATION:  DAY                           FACILITY:  [facility]   PHYSICIAN:  R. Roetta Sessions, M.D. DATE OF BIRTH:  April 05, 1981   DATE OF PROCEDURE:  DATE OF DISCHARGE:                                 OPERATIVE REPORT   PROCEDURE:  Diagnostic esophagogastroduodenoscopy.   INDICATIONS FOR PROCEDURE:  A 30 year old lady with intermittent nausea,  vomiting, postprandially.  Interestingly triggered by the smell of certain  foods.  A course of Zegerid 40 mg orally daily has not made much difference  in her symptoms.  She has already had a CT and a HIDA scan, both reportedly  unremarkable.  She sites a lot of stress in her life recently.  EGD is now  being done to further evaluate her symptoms.  My impression was this lady  was quite constipated when I saw her in our office on September 17, 2005.  I  prescribed some MiraLax for her to start taking between then and the time of  the procedure.  Tells me she did not have insurance and she did not get the  prescription filled.  She tells me she does take a stool softener, but she  does several days without a bowel movement.   The approach of EKG has been reviewed.  Potential risks, benefits and  alternatives have been reviewed and questions answered.  She is agreeable.  Please see documentation on the medical record.   PROCEDURE:  O2 saturation, blood pressure, pulse and respirations were  monitored throughout the entire procedure.  Conscious sedation with Versed 4  mg IV and Demerol 75 IV in divided doses.   INSTRUMENT:  Olympus video chip system.   FINDINGS:  Examination of the tubular esophagus revealed no mucosal  abnormalities.  The EG junction was easily traversed. .   Stomach:  The gastric cavity was empty and insufflated well with air.  Thorough examination of gastric mucosa including retroflexed view of the  proximal  stomach and esophagogastric junction demonstrated only a tiny  hiatal hernia. Pylorus patent and easily traversed. Examination of bulb and  second portion revealed no abnormalities.   THERAPEUTIC/DIAGNOSTIC MANEUVERS:  None.   Patient tolerated the procedure well, was .   ENDOSCOPY IMPRESSION:  1.  Normal esophagus.  2.  A very small hiatal hernia, otherwise normal stomach, normal D1 and D2.   Before embarking on any further GI testing, we should get her bowels moving  better, and I have strongly recommended she start taking MiraLax 17 gm  orally at bedtime, as previously recommended.  We will see how she does over  the next four weeks and plan to see her back in the office in one month and  go from there.      Jonathon Bellows, M.D.  Electronically Signed     RMR/MEDQ  D:  09/24/2005  T:  09/25/2005  Job:  [id number]   cc:   Milus Mallick. Lodema Hong, M.D.  Fax: [phone]

## 2010-11-10 NOTE — Op Note (Signed)
[facility]  Patient:    Martha [name], Martha [name] Visit Number: [id number] MRN: [id number]          Service Type: DSU Location: [facility] Attending Physician:  Maxie Better Dictated by:   Sheria Lang. Cherly Hensen, M.D. Proc. Date: 09/09/01 Admit Date:  09/09/2001 Discharge Date: 09/09/2001                             Operative Report  PREOPERATIVE DIAGNOSES:       1. Severe dysmenorrhea.                               2. Endometrial mass.  POSTOPERATIVE DIAGNOSES:      1. Severe dysmenorrhea.                               2. Endometrial mass.  OPERATION:                    1. Diagnostic laparoscopy.                               2. Peritoneal biopsy.                               3. Diagnostic hysteroscopy.                               4. Dilatation and curettage.  SURGEON:                      Sheronette A. Cherly Hensen, M.D.  ANESTHESIA:                   General.  DESCRIPTION OF PROCEDURE:     Under adequate general anesthesia, the patient was placed in the dorsolithotomy position.  The abdomen, perineum, and vagina were then sterilely prepped and draped in the usual fashion.  Examination under anesthesia had revealed an axial to retroflexed uterus.  No adnexal masses could be appreciated.  The bladder was catheterized of a scant amount of urine.  A bivalve speculum was placed in the vagina.  A single tooth tenaculum was placed on the anterior lip of the cervix.  An acorn cannula was introduced into the cervical os and attached to the tenaculum for manipulation of the uterus.  The bivalve speculum was removed.  Attention was then turned to the abdomen where 0.25% Marcaine was injected infraumbilically and suprapubically.  An infraumbilical incision was then made.  The Veress needle was introduced.  Opening pressure of 7 was noted. Two-and-a-half liters of CO2 was insufflated.  The Veress needle was then removed.  A disposable trocar was introduced into  the abdomen without incident.  A lighted video laparoscope was then placed through that port.  The patient was placed in Trendelenburg and a suprapubic incision was then made and a 5 mm port was introduced under direct visualization.  Inspection of the abdomen and pelvis was notable for normal liver edge, normal gallbladder, and normal appendix.  There was blood in the posterior cul-de-sac.  Both tubes and ovaries are normal.  Anterior cul-de-sac was inspected and no lesions were noted, and the posterior  cul-de-sac on the right medial to the right uterosacral ligament was red lesions that may be secondary to the infection of the pelvis versus atypical endometriosis.  Given this, hydrodissection was then done underneath this area of peritoneum, and the peritoneal area was removed, and sent for evaluation for possible endometriosis.  The base was cauterized carefully.  The abdomen was irrigated and suctioned.  No other findings were noted, at which time, suprapubic incision site was removed.  The abdomen was deflated and under direct visualization, the infraumbilical port was then removed.  The infraumbilical site was closed with a deep layer of 0 Vicryl figure-of-eight suture and the skin was approximated with 4-0 Vicryl, both at the infraumbilical and suprapubic site.  Attention was then turned to the vagina.  The acorn cannula was removed.  A bivalve speculum was placed in the vagina.  A diagnostic hysteroscope was initially placed.  However, due to the dilation of the cervix as a result of the acorn cannula, a distention medium was not possible.  Therefore, the resectoscope was then inserted.  There was a polypoid lesion noted in the posterior wall as well as the right lateral wall.  These were resected.  The cavity was then gently curetted and inspection showed no other lesions.  The endocervical canal had no lesions.  The instruments were then all removed from the vagina.  Specimen  labeled endometrial curetting with polyp was sent to pathology.  The peritoneal biopsy is also a specimen.  Estimated blood loss is minimal. Complication was none.  The patient tolerated the procedure well and was transferred to the recovery room in stable condition. Dictated by:   Sheria Lang. Cherly Hensen, M.D. Attending Physician:  Maxie Better DD:  09/09/01 TD:  09/11/01 Job: [id number]

## 2010-11-10 NOTE — H&P (Signed)
NAME:  TINIE, MCGLOIN              ACCOUNT NO.:  [id number]   MEDICAL RECORD NO.:  [id number]          PATIENT TYPE:  AMB   LOCATION:                                FACILITY:  [facility]   PHYSICIAN:  R. Roetta Sessions, M.D. DATE OF BIRTH:  1981/05/13   DATE OF ADMISSION:  DATE OF DISCHARGE:  LH                                HISTORY & PHYSICAL   CHIEF COMPLAINT:  Nausea and vomiting.   Ms. Murray Guzzetta is a 30 year old African-American female, followed  primarily by Dr. Lodema Hong, who came to see me to evaluate a two-month history  of intermittent postprandial nausea and vomiting, intermittent vague  bilateral upper quadrant abdominal pain and nausea simply at the smell of  certain foods. Ms. Vesta Mixer tells me that a couple of months ago she started  having postprandial nausea and vomiting with certain types of foods such as  cooked greens like broccoli, collards and other things like raw spinach and  lettuce in particular. She states within half an hour of consuming some of  these foods she develops nausea, vomiting and intermittent upper abdominal  pain that last for about a half an hour. She vomits and it subsides.   Course of Zegerid 40 mg orally daily over the past 2 months has not made any  difference in her symptoms. She is, however, able to eat things like pasta,  rice, baked potatoes and she can actually eat red meat and fried chicken  without any problem. She has gained 10 pounds over the past six months. She  has been chronically constipated most of her life. She has one bowel  movement twice weekly. She has not had any melena or rectal bleeding. She  does not have any odynophagia or dysphagia. There is no family history of GI  malignancy. 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 She describes having a gallbladder ultrasound, CT and  HIDA scan recently. I do not have a copy of the gallbladder ultrasound, but  it is reported to be  normal. She had a hepatobiliary scan with a gallbladder  EF of 43% (incidentally, she had no reproduction in any of her symptoms with  half and half). There is no family history of chronic GI illness, including  malignancy. She does take ibuprofen once daily for migraine headaches which  she has had over approximately two-month period as well.   PAST MEDICAL HISTORY:  Unremarkable for chronic illnesses.   PAST SURGICAL HISTORY:  Laparoscopy for gynecological reasons.   CURRENT MEDICATIONS:  1.  Zegerid 40 mg orally daily.  2.  Vitamin C daily.  3.  Prilosec 20 mg daily.  4.  Ibuprofen 1 p.r.n.   ALLERGIES:  No known drug allergies.   FAMILY HISTORY:  Mother is alive at age 5 with acid reflux. Father is at  age 71 with heart problems. No history of chronic GI or liver illness.   SOCIAL HISTORY:  The patient is single. She has no children. She is a  Pension scheme manager as described above. No  tobacco. No alcohol.   REVIEW OF SYSTEMS:  No chest pain, dyspnea, fever or chills.   PHYSICAL EXAMINATION:  GENERAL:  Reveals a pleasant 30 year old lady resting  comfortably.  VITAL SIGNS:  Weight 176, height 5 foot 6-1/2 inches, temperature 98.1, BP  118/80, pulse 92.  SKIN:  Warm and dry.  HEENT:  No scleral icterus. JVD is not prominent.  CHEST:  Lungs are clear to auscultation.  HEART:  Regular rate and rhythm without murmur, gallop or rub.  ABDOMEN:  Nondistended, positive bowel sounds, soft, nontender without  appreciable mass or organomegaly.  EXTREMITIES:  No edema.   IMPRESSION:  Ms. Chardae Mulkern is a pleasant 30 year old lady with a two-  month history of intermittent postprandial nausea and sometimes vomiting  with certain foods as outlined above. She has nausea with simply smelling  certain foods.   With her dietary recall, it is difficult to understand how she may have  symptoms with certain foods and not with others to be explained by any  organic basis.   My  initial sense is there is a significant functional overlay to her  symptoms. It remains to be seen how much of her stressful job may be  relating to her GI symptoms and her headaches.   She does take ibuprofen on a regular basis which puts her at risk for peptic  ulcer disease.   She gives a significant chronic history of constipation and tells me she has  never really tired anything beyond stool softeners.   RECOMMENDATIONS:  1.  I have gone ahead and offered Ms. Macklin EGD to further evaluate her      symptoms. Potential risks, benefits, and alternatives have been      reviewed.  2.  Will start her on some MiraLax 17 g orally at bedtime. I told her that      we should establish a goal of one reasonably good bowel movement every      other day, not necessarily daily. This may actually help in managing her      upper GI tract symptoms.  3.  Further recommendations to follow.      Jonathon Bellows, M.D.  Electronically Signed     RMR/MEDQ  D:  09/17/2005  T:  09/18/2005  Job:  [id number]   cc:   Milus Mallick. Lodema Hong, M.D.  Fax: [phone]

## 2010-11-10 NOTE — Op Note (Signed)
Martha [name], Martha [name]                          ACCOUNT NO.:  [id number]   MEDICAL RECORD NO.:  [id number]                   PATIENT TYPE:  AMB   LOCATION:  [facility]                                 FACILITY:  [facility]   PHYSICIAN:  Sherry A. Rosalio Macadamia, M.D.           DATE OF BIRTH:  [birth year]   DATE OF PROCEDURE:  12/14/2003  DATE OF DISCHARGE:                                 OPERATIVE REPORT   PREOPERATIVE DIAGNOSIS:  Cervical intraepithelial neoplasia II of cervix.   POSTOPERATIVE DIAGNOSIS:  Cervical intraepithelial neoplasia II of cervix.   OPERATION/PROCEDURE:  Laser vaporization of cervix.   SURGEON:  Sherry A. Rosalio Macadamia, M.D.   ANESTHESIA:  MAC.   INDICATIONS:  This is a 30 year old, G0, P0, woman who has had an abnormal  Pap smear.  The patient was evaluated in the office under colposcopy.  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  Because of this, the patient is brought  to the operating room for laser vaporization of the cervix.   FINDINGS:  Cervical changes consistent with CIN-II to CIN-III.   DESCRIPTION OF PROCEDURE:  The patient is brought into the operating room,  given adequate IV sedation.  She was placed in the dorsal lithotomy  position.  Her perineum was draped with wet towels.  Speculum was placed  within the vagina.  The vagina was washed with acetic acid.  Paracervical  block was administered with 1% Nesacaine.  The colposcope was adjusted and  laser was attached.  The abnormal areas were outlined with CO2 laser at 5  watts power circumferentially, and then divided into quadrants.  At 30 watts  power, each quadrant was lasered.  The endocervical portion was lasered to a  depth of approximately 7 mm.  The ectocervical portion was lasered to a  depth of 2-3 mm.  There is no bleeding present.  The laser was then  decreased to a 5 watts power and defocus beam.  A brush technique was used  circumferentially around the lasered area.  The cervix was then washed with  Monsel solution.  Adequate hemostasis was present.  All instruments were  removed from the vagina.  The patient was awakened.  She was removed from  the operating table to a stretcher in stable condition.  Complications were  none.  Estimated blood loss less than 5 mL.  Specimens were none.                                               Sherry A. Rosalio Macadamia, M.D.    SAD/MEDQ  D:  12/14/2003  T:  12/14/2003  Job:  [id number]

## 2010-11-10 NOTE — Op Note (Signed)
NAME:  LARRISHA, BABINEAU              ACCOUNT NO.:  [id number]   MEDICAL RECORD NO.:  [id number]          PATIENT TYPE:  AMB   LOCATION:  DAY                           FACILITY:  [facility]   PHYSICIAN:  R. Roetta Sessions, M.D. DATE OF BIRTH:  1981/05/30   DATE OF PROCEDURE:  09/24/2005  DATE OF DISCHARGE:                                 OPERATIVE REPORT   PROCEDURE:  Diagnostic esophagogastroduodenoscopy.   INDICATIONS FOR PROCEDURE:  DICTATION ENDED AT THIS POINT.      Jonathon Bellows, M.D.  Electronically Signed     RMR/MEDQ  D:  09/24/2005  T:  09/25/2005  Job:  [id number]

## 2010-12-01 ENCOUNTER — Encounter: Payer: Self-pay | Admitting: Family Medicine

## 2010-12-07 ENCOUNTER — Ambulatory Visit: Payer: BC Managed Care – PPO | Admitting: Family Medicine

## 2011-01-26 ENCOUNTER — Encounter: Payer: Self-pay | Admitting: Internal Medicine

## 2011-01-29 ENCOUNTER — Encounter: Payer: Self-pay | Admitting: Family Medicine

## 2011-01-30 ENCOUNTER — Ambulatory Visit (INDEPENDENT_AMBULATORY_CARE_PROVIDER_SITE_OTHER): Payer: BC Managed Care – PPO | Admitting: Family Medicine

## 2011-01-30 ENCOUNTER — Encounter: Payer: Self-pay | Admitting: Family Medicine

## 2011-01-30 ENCOUNTER — Other Ambulatory Visit: Payer: Self-pay | Admitting: Family Medicine

## 2011-01-30 VITALS — BP 102/74 | HR 95 | Resp 16 | Ht 66.5 in | Wt 200.1 lb

## 2011-01-30 DIAGNOSIS — Z111 Encounter for screening for respiratory tuberculosis: Secondary | ICD-10-CM

## 2011-01-30 DIAGNOSIS — R5383 Other fatigue: Secondary | ICD-10-CM

## 2011-01-30 DIAGNOSIS — R5381 Other malaise: Secondary | ICD-10-CM

## 2011-01-30 DIAGNOSIS — G4730 Sleep apnea, unspecified: Secondary | ICD-10-CM

## 2011-01-30 DIAGNOSIS — Z13220 Encounter for screening for lipoid disorders: Secondary | ICD-10-CM

## 2011-01-30 DIAGNOSIS — D649 Anemia, unspecified: Secondary | ICD-10-CM

## 2011-01-30 DIAGNOSIS — E669 Obesity, unspecified: Secondary | ICD-10-CM

## 2011-01-30 DIAGNOSIS — Z23 Encounter for immunization: Secondary | ICD-10-CM

## 2011-01-30 DIAGNOSIS — Z139 Encounter for screening, unspecified: Secondary | ICD-10-CM

## 2011-01-30 DIAGNOSIS — R7301 Impaired fasting glucose: Secondary | ICD-10-CM

## 2011-01-30 DIAGNOSIS — K219 Gastro-esophageal reflux disease without esophagitis: Secondary | ICD-10-CM

## 2011-01-30 MED ORDER — ESOMEPRAZOLE MAGNESIUM 20 MG PO CPDR: 20.0000 mg | DELAYED_RELEASE_CAPSULE | Freq: Every day | ORAL | Status: DC

## 2011-01-30 NOTE — Assessment & Plan Note (Signed)
Improved. Pt applauded on succesful weight loss through lifestyle change, and encouraged to continue same. Weight loss goal set for the next several months.  

## 2011-01-30 NOTE — Patient Instructions (Addendum)
F/U in 3.5  months  It is important that you exercise regularly at least 30 minutes 5 times a week. If you develop chest pain, have severe difficulty breathing, or feel very tired, stop exercising immediately and seek medical attention   Weight loss goal is 10 pounds   A healthy diet is rich in fruit, vegetables and whole grains. Poultry fish, nuts and beans are a healthy choice for protein rather then red meat. A low sodium diet and drinking 64 ounces of water daily is generally recommended. Oils and sweet should be limited. Carbohydrates especially for those who are diabetic or overweight, should be limited to 34-45 gram per meal. It is important to eat on a regular schedule, at least 3 times daily. Snacks should be primarily fruits, vegetables or nuts.  Sleep study will be scheduled.  Med is sent in for reflux.  Fasting labs as soon as possible TdAp today, also PPD to be placed today

## 2011-01-30 NOTE — Progress Notes (Signed)
  Subjective:    Patient ID: Martha [name], female    DOB: [birth year], 30 y.o.   MRN: [id number]  HPI The PT is here for follow up and re-evaluation of chronic medical conditions, medication management and review of any available recent lab and radiology data.  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   She has changed her diet and been succesfull  with some weight loss, still no regular exercise. C/o excessive fatigue, and states her spouse is complaining increasingly about her snoring States she stresses and worries a lot about everything. Has been depressed in the past, does not want medication at this time. Will look into counseling through her job C/o significant reflux with burning and acid coming up into her mouth and nostrils at times.Her caffeine use is excessive     Review of Systems See HPI Denies recent fever or chills. Denies sinus pressure, nasal congestion, ear pain or sore throat. Denies chest congestion, productive cough or wheezing. Denies chest pains, palpitations and leg swelling  Denies dysuria, frequency, hesitancy or incontinence. Denies joint pain, swelling and limitation in mobility.  Denies skin break down or rash.        Objective:   Physical Exam Patient alert and oriented and in no cardiopulmonary distress.  HEENT: No facial asymmetry, EOMI, no sinus tenderness,  oropharynx pink and moist.  Neck supple no adenopathy.  Chest: Clear to auscultation bilaterally.  CVS: S1, S2 no murmurs, no S3.  ABD: Soft non tender. Bowel sounds normal.  Ext: No edema  MS: Adequate ROM spine, shoulders, hips and knees.  Skin: Intact, no ulcerations or rash noted.  Psych: Good eye contact, normal affect. Memory intact not anxious or depressed appearing.  CNS: CN 2-12 intact, power, tone and sensation  normal throughout.        Assessment & Plan:

## 2011-01-30 NOTE — Assessment & Plan Note (Signed)
Uncontrolled symptoms, pt to start PPI which is covered by her ins, reports nexium works but unable to afford, will call back if needed, coupon for nexium given Also counseled to cut back to stopping caffeine

## 2011-01-30 NOTE — Assessment & Plan Note (Signed)
Classic symptoms of severe sleep apnea, the urgency of getting study done explained, states she will have the test, the snoring and fatigue are out of control

## 2011-02-02 ENCOUNTER — Encounter: Payer: Self-pay | Admitting: Family Medicine

## 2011-02-02 LAB — TB SKIN TEST

## 2011-02-02 LAB — CBC WITH DIFFERENTIAL/PLATELET
Basophils Absolute: 0 10*3/uL (ref 0.0–0.1)
Eosinophils Relative: 1 % (ref 0–5)
Lymphs Abs: 1.8 10*3/uL (ref 0.7–4.0)
MCV: 77.1 fL — ABNORMAL LOW (ref 78.0–100.0)
Monocytes Absolute: 1.1 10*3/uL — ABNORMAL HIGH (ref 0.1–1.0)
Monocytes Relative: 12 % (ref 3–12)
Neutro Abs: 5.5 10*3/uL (ref 1.7–7.7)
Neutrophils Relative %: 65 % (ref 43–77)

## 2011-02-02 LAB — BASIC METABOLIC PANEL
BUN: 8 mg/dL (ref 6–23)
CO2: 24 mEq/L (ref 19–32)
Calcium: 9.5 mg/dL (ref 8.4–10.5)
Potassium: 4.6 mEq/L (ref 3.5–5.3)
Sodium: 138 mEq/L (ref 135–145)

## 2011-02-02 LAB — LIPID PANEL
HDL: 44 mg/dL (ref >39)
Triglycerides: 86 mg/dL (ref <150)
VLDL: 17 mg/dL (ref 0–40)

## 2011-02-02 LAB — VITAMIN D 25 HYDROXY (VIT D DEFICIENCY, FRACTURES): Vit D, 25-Hydroxy: 14 ng/mL — ABNORMAL LOW (ref 30–89)

## 2011-02-03 ENCOUNTER — Other Ambulatory Visit: Payer: Self-pay | Admitting: Family Medicine

## 2011-02-05 ENCOUNTER — Encounter: Payer: Self-pay | Admitting: Family Medicine

## 2011-02-05 LAB — ANEMIA PANEL
Ferritin: 40 ng/mL (ref 10–291)
Folate: 9.5 ng/mL
TIBC: 357 ug/dL (ref 250–470)
Vitamin B-12: 323 pg/mL (ref 211–911)

## 2011-02-05 MED ORDER — VITAMIN D (ERGOCALCIFEROL) 1.25 MG (50000 UNIT) PO CAPS: 50000.0000 [IU] | ORAL_CAPSULE | ORAL | Status: DC

## 2011-02-05 NOTE — Progress Notes (Signed)
Addended by: Abner Greenspan on: 02/05/2011 01:35 PM   Modules accepted: Orders

## 2011-02-06 ENCOUNTER — Ambulatory Visit: Payer: BC Managed Care – PPO | Admitting: Family Medicine

## 2011-03-06 ENCOUNTER — Telehealth: Payer: Self-pay | Admitting: Family Medicine

## 2011-03-06 NOTE — Telephone Encounter (Signed)
Cannot be seen tilll tomorrow pm or Thursday pm, pls let her know and schedule if she wants this

## 2011-03-06 NOTE — Telephone Encounter (Signed)
Martha [name] said there is no openings. Can we work her in?

## 2011-03-07 ENCOUNTER — Encounter: Payer: Self-pay | Admitting: Family Medicine

## 2011-03-07 NOTE — Telephone Encounter (Signed)
Left message to call back  

## 2011-03-08 ENCOUNTER — Encounter: Payer: Self-pay | Admitting: Family Medicine

## 2011-03-08 ENCOUNTER — Ambulatory Visit (INDEPENDENT_AMBULATORY_CARE_PROVIDER_SITE_OTHER): Payer: BC Managed Care – PPO | Admitting: Family Medicine

## 2011-03-08 VITALS — BP 132/80 | HR 90 | Resp 16 | Ht 66.0 in | Wt 200.0 lb

## 2011-03-08 DIAGNOSIS — G4730 Sleep apnea, unspecified: Secondary | ICD-10-CM

## 2011-03-08 DIAGNOSIS — F32A Depression, unspecified: Secondary | ICD-10-CM

## 2011-03-08 DIAGNOSIS — F329 Major depressive disorder, single episode, unspecified: Secondary | ICD-10-CM

## 2011-03-08 DIAGNOSIS — E669 Obesity, unspecified: Secondary | ICD-10-CM

## 2011-03-08 DIAGNOSIS — R079 Chest pain, unspecified: Secondary | ICD-10-CM

## 2011-03-08 DIAGNOSIS — N644 Mastodynia: Secondary | ICD-10-CM

## 2011-03-08 DIAGNOSIS — F3289 Other specified depressive episodes: Secondary | ICD-10-CM

## 2011-03-08 DIAGNOSIS — Z23 Encounter for immunization: Secondary | ICD-10-CM

## 2011-03-08 MED ORDER — INFLUENZA VAC TYPES A & B PF IM SUSP: 0.5000 mL | Freq: Once | INTRAMUSCULAR | Status: DC

## 2011-03-08 MED ORDER — FLUOXETINE HCL 10 MG PO CAPS: 10.0000 mg | ORAL_CAPSULE | Freq: Every day | ORAL | Status: DC

## 2011-03-08 NOTE — Patient Instructions (Signed)
F/U in 2 months.  You will get an EKG  For evaluation of chest pain.  You will be referred for an ultrasound of your breasts.  You will be be referred to psychiatrist in [place], and new medication to be started today for depression

## 2011-03-09 NOTE — Telephone Encounter (Signed)
PATIENT CAME IN 9.13.12

## 2011-03-16 ENCOUNTER — Encounter: Payer: Self-pay | Admitting: Family Medicine

## 2011-03-20 ENCOUNTER — Telehealth: Payer: Self-pay | Admitting: Family Medicine

## 2011-03-20 DIAGNOSIS — N644 Mastodynia: Secondary | ICD-10-CM | POA: Insufficient documentation

## 2011-03-20 DIAGNOSIS — R079 Chest pain, unspecified: Secondary | ICD-10-CM | POA: Insufficient documentation

## 2011-03-20 NOTE — Assessment & Plan Note (Signed)
C/o bilateral breast pain and tenderness, in aa cyclical manner, likely fibrocystic disease, will refer for [place], no h/o breast cancer in close family member

## 2011-03-20 NOTE — Telephone Encounter (Signed)
Sallie had called about referral

## 2011-03-20 NOTE — Assessment & Plan Note (Signed)
Still awaiting sleep study to establish diagnosis, history is consistent with the disease

## 2011-03-20 NOTE — Telephone Encounter (Signed)
I spoke with pt about her behavior health referral. She is aware of this appt.

## 2011-03-20 NOTE — Assessment & Plan Note (Signed)
Deteriorated, h/o previous suicide attempt, recently after an argument with spouse pt took an [place] number of pills and left home with a note written, needs to establish with psychology and psychiatry. Will start anti depressants in the interim

## 2011-03-20 NOTE — Assessment & Plan Note (Signed)
Unchanged Patient re-educated about  the importance of commitment to a  minimum of 150 minutes of exercise per week. The importance of healthy food choices with portion control discussed. Encouraged to start a food diary, count calories and to consider  joining a support group. Sample diet sheets offered. Goals set by the patient for the next several months. The value of exercise in depression treatment was also stressed.

## 2011-03-20 NOTE — Assessment & Plan Note (Signed)
Recurrent episodes of left chest pain , no associated nausea, diaphoresis, not associated with physical activity. EKG shows normal sinus rhythm with no evidence of ischemia. Pt reassured

## 2011-03-22 NOTE — Progress Notes (Signed)
  Subjective:    Patient ID: Martha [name], female    DOB: 22-May-1981, 30 y.o.   MRN: [id number]  HPI Pt in with c/o left chest pain intermittently for the past 2 months. She has chronic fatigue. She denies nausea , diaphoresis  , or vomiting with the pain. At times it appears to radiate to the left shoulder. There are no specific aggravating or relieving factors. C/o recurrent bilateral breast pain, denies nipple d/c , breast fells lumpy.No known f/h of breast cancer. C/ostress and depression which is worsening, recent argument with her spouse had her taking excessive tablets, leaving the house and a note. Had no active suicidal plans at the time, but in the past has intentionally overdosed and states she often feels as though there is little reason to live ad feels overwhelmed. Many unresolved distressing childhood relationship issues with her family which is a loving and supportive one, but one in which she often feels marginalized   Review of Systems See HPI Denies recent fever or chills. Denies sinus pressure, nasal congestion, ear pain or sore throat. Denies chest congestion, productive cough or wheezing. Denies  palpitations and leg swelling Denies abdominal pain, nausea, vomiting,diarrhea or constipation.   Denies dysuria, frequency, hesitancy or incontinence. Denies joint pain, swelling and limitation in mobility. Denies headaches, seizures, numbness, or tingling. Denies skin break down or rash.        Objective:   Physical Exam Patient alert and oriented and in no cardiopulmonary distress.  HEENT: No facial asymmetry, EOMI, no sinus tenderness,  oropharynx pink and moist.  Neck supple no adenopathy.  Chest: Clear to auscultation bilaterally. Breast: no palpable masses no nipple d/c no axillary nodes or supraclavicular nodes palpable  CVS: S1, S2 no murmurs, no S3.  ABD: Soft non tender. Bowel sounds normal.  Ext: No edema  MS: Adequate ROM spine, shoulders, hips and  knees.  Skin: Intact, no ulcerations or rash noted.  Psych: Good eye contact,flat affect. Memory intact , tearful and  depressed appearing.  CNS: CN 2-12 intact, power, tone and sensation normal throughout.        Assessment & Plan:

## 2011-03-26 LAB — URINALYSIS, ROUTINE W REFLEX MICROSCOPIC
Bilirubin Urine: NEGATIVE
Glucose, UA: NEGATIVE
Hgb urine dipstick: NEGATIVE
Ketones, ur: NEGATIVE
Protein, ur: NEGATIVE
pH: 6.5

## 2011-03-26 LAB — GC/CHLAMYDIA PROBE AMP, GENITAL
Chlamydia, DNA Probe: NEGATIVE
GC Probe Amp, Genital: NEGATIVE

## 2011-03-26 LAB — URINE MICROSCOPIC-ADD ON

## 2011-03-26 LAB — WET PREP, GENITAL
Trich, Wet Prep: NONE SEEN
Yeast Wet Prep HPF POC: NONE SEEN

## 2011-03-28 ENCOUNTER — Ambulatory Visit (HOSPITAL_COMMUNITY): Payer: BC Managed Care – PPO

## 2011-05-22 ENCOUNTER — Other Ambulatory Visit: Payer: Self-pay

## 2011-05-22 DIAGNOSIS — F329 Major depressive disorder, single episode, unspecified: Secondary | ICD-10-CM

## 2011-05-22 DIAGNOSIS — F32A Depression, unspecified: Secondary | ICD-10-CM

## 2011-05-22 MED ORDER — FLUOXETINE HCL 10 MG PO CAPS: 10.0000 mg | ORAL_CAPSULE | Freq: Every day | ORAL | Status: DC

## 2011-06-01 ENCOUNTER — Encounter: Payer: Self-pay | Admitting: Family Medicine

## 2011-06-06 ENCOUNTER — Other Ambulatory Visit: Payer: Self-pay | Admitting: Family Medicine

## 2011-06-06 ENCOUNTER — Telehealth: Payer: Self-pay | Admitting: Family Medicine

## 2011-06-06 MED ORDER — ALPRAZOLAM 0.25 MG PO TABS: ORAL_TABLET | ORAL | Status: DC

## 2011-06-06 NOTE — Telephone Encounter (Signed)
Spoke with pt and let her know of new rx for xanax.  She expresses thanks for condolences.

## 2011-06-06 NOTE — Telephone Encounter (Signed)
Let her know I will send in low dose of xanax for use at night only for sleep  10 tabs only, let her know I send heartfelt condolence. Let her know med may make her sleepy and is potentially addictive so limited use only. pls fax script i I am entering

## 2011-06-07 ENCOUNTER — Ambulatory Visit: Payer: BC Managed Care – PPO | Admitting: Family Medicine

## 2011-08-09 ENCOUNTER — Ambulatory Visit: Payer: BC Managed Care – PPO | Admitting: Family Medicine

## 2011-09-05 IMAGING — CR DG CHEST 2V
2 series · 2 of 2 positions shown · non-contrast
Comparison: 04/20/2005

CLINICAL DATA: Cough

CHEST - 2 VIEW

[w chest pa]
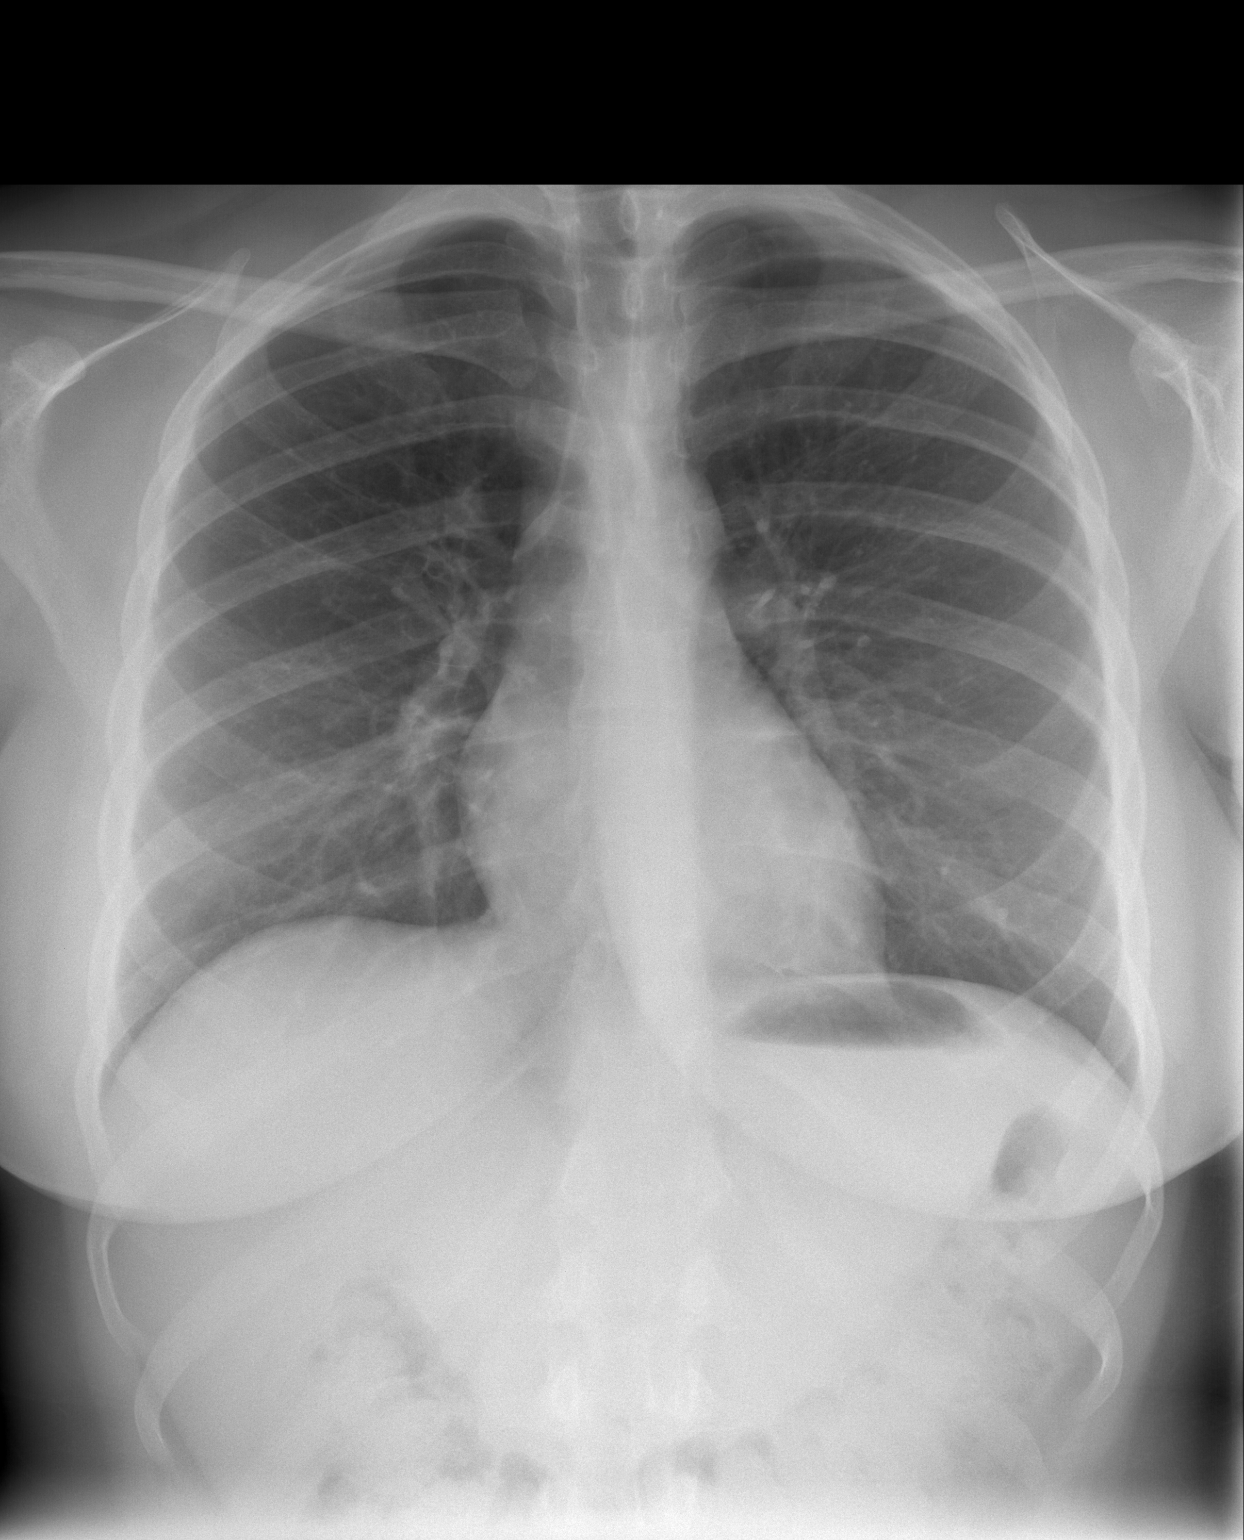

[w chest lat]
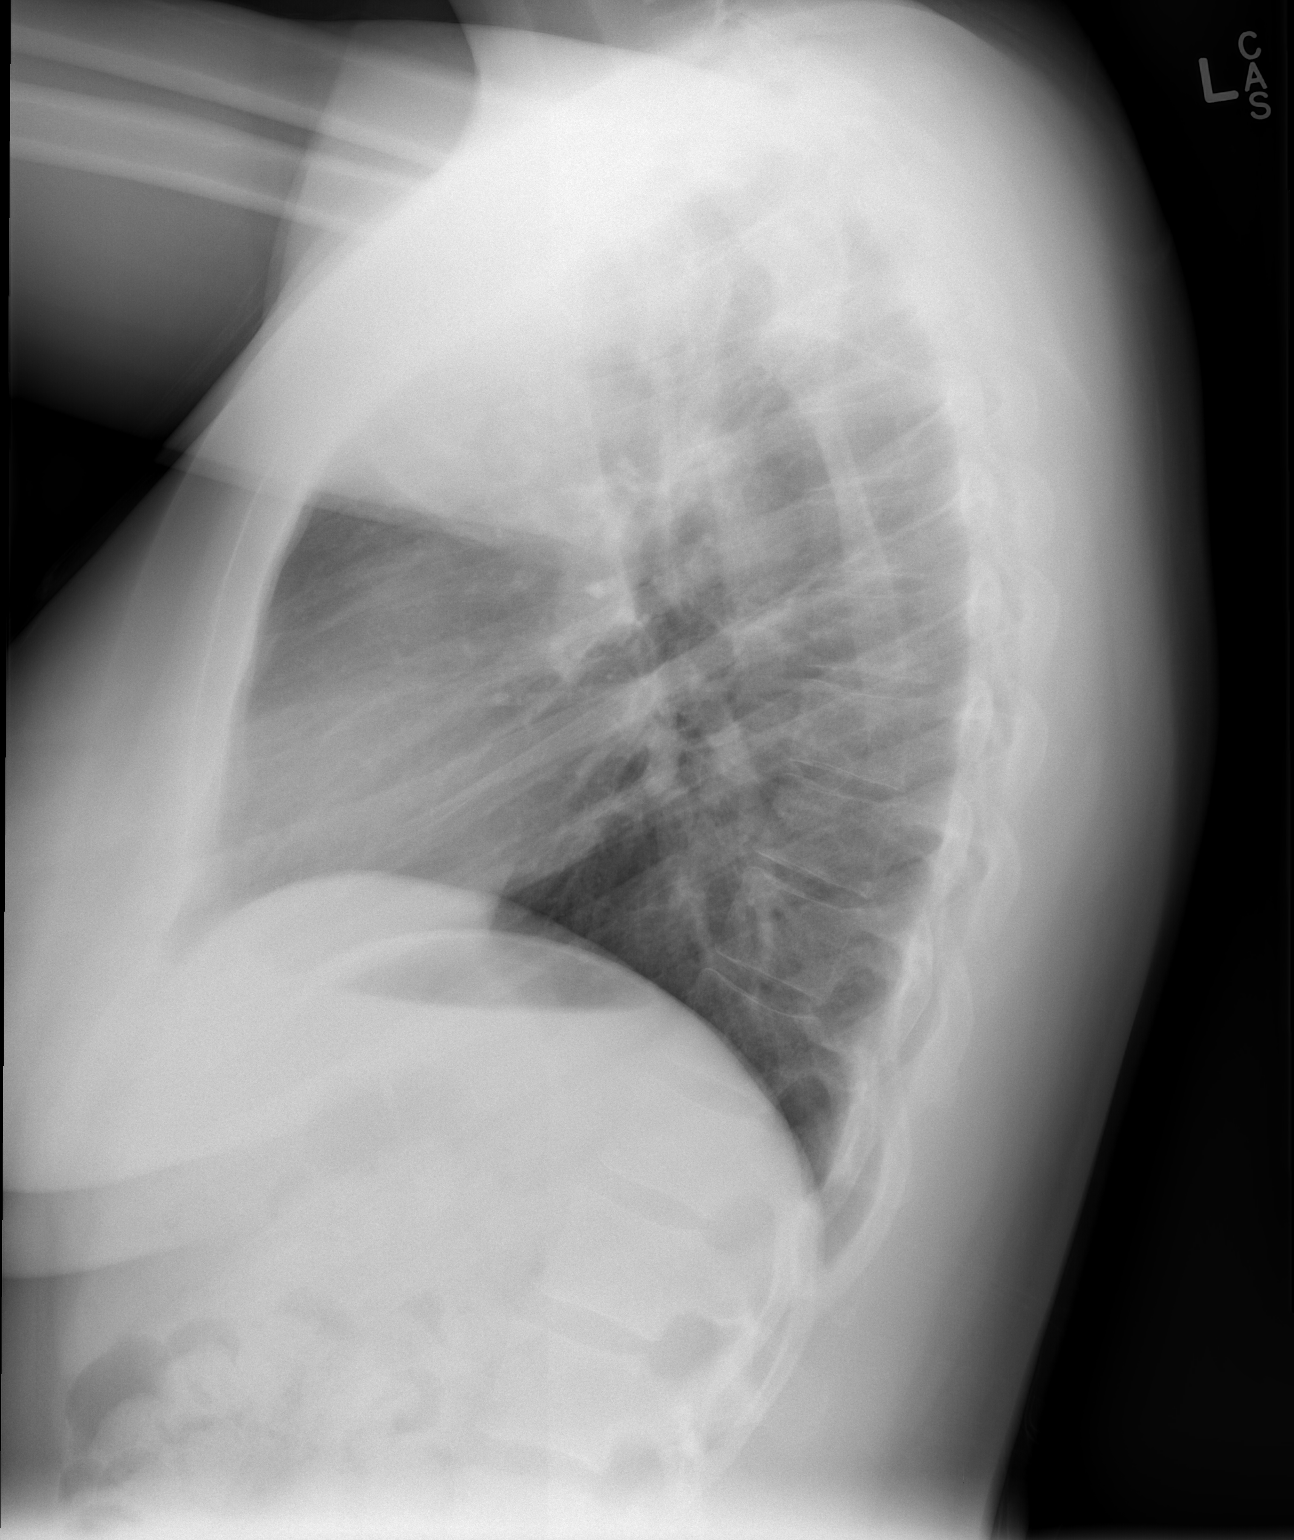

[2 of 2 positions shown; findings below may reference images not displayed]

FINDINGS: Normal heart size.  Mild bronchitic changes.  Clear
lungs.
IMPRESSION: Bronchitic changes.

## 2011-09-17 ENCOUNTER — Other Ambulatory Visit: Payer: Self-pay | Admitting: Family Medicine

## 2012-09-26 ENCOUNTER — Ambulatory Visit (INDEPENDENT_AMBULATORY_CARE_PROVIDER_SITE_OTHER): Payer: BC Managed Care – PPO | Admitting: Family Medicine

## 2012-09-26 DIAGNOSIS — E669 Obesity, unspecified: Secondary | ICD-10-CM

## 2012-09-29 NOTE — Progress Notes (Signed)
  Subjective:    Patient ID: Martha [name], female    DOB: [birth year], 32 y.o.   MRN: [id number]  HPI Pt did not come for visit   Review of Systems     Objective:   Physical Exam        Assessment & Plan:

## 2012-10-20 ENCOUNTER — Encounter: Payer: Self-pay | Admitting: Family Medicine

## 2012-10-20 ENCOUNTER — Ambulatory Visit (INDEPENDENT_AMBULATORY_CARE_PROVIDER_SITE_OTHER): Payer: BC Managed Care – PPO | Admitting: Family Medicine

## 2012-10-20 VITALS — BP 110/80 | HR 78 | Resp 18 | Ht 66.0 in | Wt 195.1 lb

## 2012-10-20 DIAGNOSIS — Z0000 Encounter for general adult medical examination without abnormal findings: Secondary | ICD-10-CM

## 2012-10-20 DIAGNOSIS — D649 Anemia, unspecified: Secondary | ICD-10-CM

## 2012-10-20 DIAGNOSIS — E785 Hyperlipidemia, unspecified: Secondary | ICD-10-CM

## 2012-10-20 DIAGNOSIS — R7309 Other abnormal glucose: Secondary | ICD-10-CM

## 2012-10-20 DIAGNOSIS — K219 Gastro-esophageal reflux disease without esophagitis: Secondary | ICD-10-CM

## 2012-10-20 DIAGNOSIS — R7303 Prediabetes: Secondary | ICD-10-CM

## 2012-10-20 DIAGNOSIS — M654 Radial styloid tenosynovitis [de Quervain]: Secondary | ICD-10-CM

## 2012-10-20 DIAGNOSIS — E559 Vitamin D deficiency, unspecified: Secondary | ICD-10-CM

## 2012-10-20 DIAGNOSIS — E669 Obesity, unspecified: Secondary | ICD-10-CM

## 2012-10-20 NOTE — Patient Instructions (Addendum)
F/u in 6 month  Fasting lipid, cmp, HBA1C, CBC, TSH, Vit D, HIV , RPR as soon as possiblr  It is important that you exercise regularly at least 30 minutes 5 times a week. If you develop chest pain, have severe difficulty breathing, or feel very tired, stop exercising immediately and seek medical attention   A healthy diet is rich in fruit, vegetables and whole grains. Poultry fish, nuts and beans are a healthy choice for protein rather then red meat. A low sodium diet and drinking 64 ounces of water daily is generally recommended. Oils and sweet should be limited. Carbohydrates especially for those who are diabetic or overweight, should be limited to 30-45 gram per meal. It is important to eat on a regular schedule, at least 3 times daily. Snacks should be primarily fruits, vegetables or nuts.  You will get info on carb counting

## 2012-10-20 NOTE — Progress Notes (Signed)
  Subjective:    Patient ID: Martha [name], female    DOB: [birth year], 32 y.o.   MRN: [id number]  HPI Pt in for general exam, she needs this as a precursor to getting immunization and other medical care through the [employer]. [place] in the next 2 [place], needs an updated physical. She is on no medication by prescription, only takes a multivitamin  And benadryl at night for allergy symptoms Has been exercising on a more regular basis in the last 6 month, approx 4 times per week, and has been conscious about diet to [employer] health issues.  Sleep and mental health issues are fine, work is fine.  Feels well, her only current known challenge is her weight     Review of Systems See HPI Denies recent fever or chills. Denies sinus pressure, nasal congestion, ear pain or sore throat. Denies chest congestion, productive cough or wheezing. Denies chest pains, palpitations, PND, orhtopnea and leg swelling Denies abdominal pain, nausea, vomiting,diarrhea or constipation.   Denies dysuria, frequency, hesitancy or incontinence. Cycles are every month , heavy, duration 5 days, desires conception. Had an abnormal pap in the past , however , has followed closely with gyne, and pt reports that her most recent pap done in March 2014 , was normal Occasional right wrist and hand pain, has had ganglion cyst removed from the area in 2011 and 2013 Denies headaches, seizures, numbness, or tingling. Denies depression, anxiety or insomnia. Denies skin break down or rash.        Objective:   Physical Exam Patient alert and oriented and in no cardiopulmonary distress.  HEENT: No facial asymmetry, EOMI, no sinus tenderness,  oropharynx pink and moist.  Neck supple no adenopathy.  Chest: Clear to auscultation bilaterally.  CVS: S1, S2 no murmurs, no S3.  ABD: Soft non tender. Bowel sounds normal.  Ext: No edema  MS: Adequate ROM spine, shoulders, hips and knees.  Skin: Intact, no  ulcerations or rash noted.  Psych: Good eye contact, normal affect. Memory intact not anxious or depressed appearing.  CNS: CN 2-12 intact, power, tone and sensation normal throughout.        Assessment & Plan:

## 2012-10-21 ENCOUNTER — Other Ambulatory Visit: Payer: Self-pay | Admitting: Family Medicine

## 2012-10-21 DIAGNOSIS — R7303 Prediabetes: Secondary | ICD-10-CM | POA: Insufficient documentation

## 2012-10-21 DIAGNOSIS — E785 Hyperlipidemia, unspecified: Secondary | ICD-10-CM | POA: Insufficient documentation

## 2012-10-21 DIAGNOSIS — E559 Vitamin D deficiency, unspecified: Secondary | ICD-10-CM | POA: Insufficient documentation

## 2012-10-21 LAB — CBC
Hemoglobin: 10.9 g/dL — ABNORMAL LOW (ref 12.0–15.0)
MCHC: 32 g/dL (ref 30.0–36.0)
Platelets: 219 10*3/uL (ref 150–400)
RBC: 4.5 MIL/uL (ref 3.87–5.11)

## 2012-10-21 LAB — HEMOGLOBIN A1C
Hgb A1c MFr Bld: 5.3 % (ref <5.7)
Mean Plasma Glucose: 105 mg/dL (ref <117)

## 2012-10-21 NOTE — Assessment & Plan Note (Signed)
Updated lab needed, has been iron deficient in the past

## 2012-10-21 NOTE — Assessment & Plan Note (Signed)
Unchanged. Patient re-educated about  the importance of commitment to a  minimum of 150 minutes of exercise per week. The importance of healthy food choices with portion control discussed. Encouraged to start a food diary, count calories and to consider  joining a support group. Sample diet sheets offered. Goals set by the patient for the next several months.    

## 2012-10-21 NOTE — Assessment & Plan Note (Signed)
Patient advised to reduce carb and sweets, commit to regular physical activity, take meds as prescribed, test blood as directed, and attempt to lose weight, to improve blood sugar control. Updated lab needed   

## 2012-10-21 NOTE — Assessment & Plan Note (Signed)
S/p surgery x 2, reports ongoing right wrist and hand discomfort, marginal

## 2012-10-21 NOTE — Assessment & Plan Note (Signed)
Asymptomatic off of medication 

## 2012-10-21 NOTE — Assessment & Plan Note (Signed)
Updated lab needed.  

## 2012-10-21 NOTE — Assessment & Plan Note (Signed)
Hyperlipidemia:Low fat diet discussed and encouraged.  Updated lab needed 

## 2012-10-22 ENCOUNTER — Other Ambulatory Visit: Payer: Self-pay

## 2012-10-22 ENCOUNTER — Other Ambulatory Visit: Payer: Self-pay | Admitting: Family Medicine

## 2012-10-22 LAB — LIPID PANEL
LDL Cholesterol: 111 mg/dL — ABNORMAL HIGH (ref 0–99)
Total CHOL/HDL Ratio: 3.6 Ratio

## 2012-10-22 LAB — VITAMIN D 25 HYDROXY (VIT D DEFICIENCY, FRACTURES): Vit D, 25-Hydroxy: 12 ng/mL — ABNORMAL LOW (ref 30–89)

## 2012-10-22 LAB — TSH: TSH: 1.62 u[IU]/mL (ref 0.350–4.500)

## 2012-10-22 LAB — IRON: Iron: 40 ug/dL — ABNORMAL LOW (ref 42–145)

## 2012-10-22 MED ORDER — ERGOCALCIFEROL 1.25 MG (50000 UT) PO CAPS: 50000.0000 [IU] | ORAL_CAPSULE | ORAL | Status: DC

## 2012-11-27 ENCOUNTER — Telehealth: Payer: Self-pay

## 2012-11-27 ENCOUNTER — Other Ambulatory Visit: Payer: Self-pay | Admitting: Family Medicine

## 2012-11-27 DIAGNOSIS — G4710 Hypersomnia, unspecified: Secondary | ICD-10-CM

## 2012-11-27 DIAGNOSIS — G4730 Sleep apnea, unspecified: Secondary | ICD-10-CM

## 2012-11-27 NOTE — Telephone Encounter (Signed)
Referral ws entered since last week , pls set up appt withpulmonary DOc requested fopr eval for sleep apnea

## 2012-12-08 ENCOUNTER — Telehealth: Payer: Self-pay | Admitting: Family Medicine

## 2012-12-08 NOTE — Telephone Encounter (Signed)
Patient is aware 

## 2012-12-15 ENCOUNTER — Ambulatory Visit (INDEPENDENT_AMBULATORY_CARE_PROVIDER_SITE_OTHER): Payer: BC Managed Care – PPO | Admitting: Pulmonary Disease

## 2012-12-15 ENCOUNTER — Encounter: Payer: Self-pay | Admitting: Pulmonary Disease

## 2012-12-15 VITALS — BP 122/76 | HR 86 | Temp 97.9°F | Ht 66.5 in | Wt 199.8 lb

## 2012-12-15 DIAGNOSIS — G4710 Hypersomnia, unspecified: Secondary | ICD-10-CM

## 2012-12-15 DIAGNOSIS — G4719 Other hypersomnia: Secondary | ICD-10-CM | POA: Insufficient documentation

## 2012-12-15 MED ORDER — MOMETASONE FUROATE 50 MCG/ACT NA SUSP: 2.0000 | Freq: Every day | NASAL | Status: DC

## 2012-12-15 NOTE — Assessment & Plan Note (Signed)
-  with loud snoring suggestive of obstructive sleep apnea Given excessive daytime somnolence, narrow pharyngeal exam, witnessed apneas & loud snoring, obstructive sleep apnea is very likely & an overnight polysomnogram will be scheduled as a split study. The pathophysiology of obstructive sleep apnea , it's cardiovascular consequences & modes of treatment including CPAP were discused with the patient in detail & they evidenced understanding.  Sleep study will be scheduled - high pre test prob, doubt narcolepsy - fe def anemia could be contributing Avoid energy drinks or naps on day of study Trial of nasal spray each nare at bedtime for snoring  Mild sleep onset insomnia may be related to adjustment disorder & is hopefully transient

## 2012-12-15 NOTE — Patient Instructions (Signed)
You may have obstructive sleep apnea Sleep study will be scheduled Trial of nasal spray each nare at bedtime for snoring
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
## 2013-01-06 ENCOUNTER — Encounter (HOSPITAL_BASED_OUTPATIENT_CLINIC_OR_DEPARTMENT_OTHER): Payer: BC Managed Care – PPO

## 2013-01-20 ENCOUNTER — Institutional Professional Consult (permissible substitution): Payer: BC Managed Care – PPO | Admitting: Internal Medicine

## 2014-06-25 NOTE — L&D Delivery Note (Signed)
Delivery Note At 6:04 PM a viable and healthy female was delivered via Vaginal, Spontaneous Delivery (Presentation: Right Occiput Anterior).  APGAR: 9, 9; weight pending .   Placenta status: Intact, Spontaneous. Not sent.  Cord: 3 vessels with the following complications: None.  Cord pH: none  Anesthesia: Epidural  Episiotomy: None Lacerations: Vaginal Suture Repair: 3.0 chromic Est. Blood Loss (mL): 350  Mom to postpartum.  Baby to Couplet care / Skin to Skin.  Sariah Henkin A 06/25/2015, 6:47 PM

## 2014-12-16 LAB — OB RESULTS CONSOLE HEPATITIS B SURFACE ANTIGEN: HEP B S AG: NEGATIVE

## 2014-12-16 LAB — OB RESULTS CONSOLE ANTIBODY SCREEN: Antibody Screen: NEGATIVE

## 2014-12-16 LAB — OB RESULTS CONSOLE HIV ANTIBODY (ROUTINE TESTING): HIV: NONREACTIVE

## 2014-12-16 LAB — OB RESULTS CONSOLE GC/CHLAMYDIA
Chlamydia: NEGATIVE
Gonorrhea: NEGATIVE

## 2014-12-16 LAB — OB RESULTS CONSOLE ABO/RH: RH TYPE: POSITIVE

## 2014-12-16 LAB — OB RESULTS CONSOLE RUBELLA ANTIBODY, IGM: RUBELLA: IMMUNE

## 2014-12-16 LAB — OB RESULTS CONSOLE RPR: RPR: NONREACTIVE

## 2015-01-24 ENCOUNTER — Other Ambulatory Visit (HOSPITAL_COMMUNITY): Payer: Self-pay | Admitting: Obstetrics & Gynecology

## 2015-01-24 DIAGNOSIS — O280 Abnormal hematological finding on antenatal screening of mother: Secondary | ICD-10-CM

## 2015-01-25 ENCOUNTER — Encounter (HOSPITAL_COMMUNITY): Payer: Self-pay | Admitting: Obstetrics and Gynecology

## 2015-02-01 ENCOUNTER — Other Ambulatory Visit (HOSPITAL_COMMUNITY): Payer: Self-pay | Admitting: Obstetrics & Gynecology

## 2015-02-01 DIAGNOSIS — Z3A18 18 weeks gestation of pregnancy: Secondary | ICD-10-CM

## 2015-02-01 DIAGNOSIS — O280 Abnormal hematological finding on antenatal screening of mother: Secondary | ICD-10-CM

## 2015-02-01 DIAGNOSIS — Z3689 Encounter for other specified antenatal screening: Secondary | ICD-10-CM

## 2015-02-02 ENCOUNTER — Ambulatory Visit (HOSPITAL_COMMUNITY)
Admission: RE | Admit: 2015-02-02 | Discharge: 2015-02-02 | Disposition: A | Discharge status: Home / Self Care | Source: Ambulatory Visit | Attending: Obstetrics & Gynecology | Admitting: Obstetrics & Gynecology

## 2015-02-02 ENCOUNTER — Encounter (HOSPITAL_COMMUNITY): Payer: Self-pay

## 2015-02-02 DIAGNOSIS — O283 Abnormal ultrasonic finding on antenatal screening of mother: Secondary | ICD-10-CM | POA: Insufficient documentation

## 2015-02-02 DIAGNOSIS — O351XX0 Maternal care for (suspected) chromosomal abnormality in fetus, not applicable or unspecified: Secondary | ICD-10-CM | POA: Diagnosis not present

## 2015-02-02 DIAGNOSIS — Z315 Encounter for genetic counseling: Secondary | ICD-10-CM | POA: Diagnosis not present

## 2015-02-02 DIAGNOSIS — O289 Unspecified abnormal findings on antenatal screening of mother: Secondary | ICD-10-CM | POA: Insufficient documentation

## 2015-02-02 DIAGNOSIS — Z3A18 18 weeks gestation of pregnancy: Secondary | ICD-10-CM | POA: Diagnosis not present

## 2015-02-02 DIAGNOSIS — O280 Abnormal hematological finding on antenatal screening of mother: Secondary | ICD-10-CM

## 2015-02-02 DIAGNOSIS — Z3689 Encounter for other specified antenatal screening: Secondary | ICD-10-CM

## 2015-02-02 NOTE — Progress Notes (Signed)
Genetic Counseling  DOB: 01/19/1981 Referring Provider: Shea Evans, MD Appointment Date: 02/02/2015 Attending: Dr. Maxie Better  Mrs. Martha [name] was seen for genetic counseling because of an increased risk for fetal Down syndrome based on a maternal serum Quad screen.  In summary:  Discussed abnormal Quad screen results  Discussed, in general terms, normal NIPS results as results were not available for review  Discussed alpha thalassemia carrier status and various outcomes as patients and husbands screening labs were not available for review  She was counseled regarding the Quad screen result and the associated 1 in 175 risk for fetal Down syndrome.  We reviewed chromosomes, nondisjunction, and the common features and variable prognosis of Down syndrome.  In addition, we reviewed the screen adjusted reduction in risks for trisomy 18 and ONTDs.  We also discussed other explanations for a screen positive result including: a gestational dating error, differences in maternal metabolism, and normal variation. She understands that this screening is not diagnostic for Down syndrome but provides a risk assessment.  We also reviewed the results of Mrs. Mulvihill noninvasive prenatal screening (NIPS)/cell free DNA (cfDNA) and ultrasound.  We discussed the technology behind cfDNA screening and the adjustment in the chance for aneuploidy (specifically Trisomy 13, 18 or 21) in the fetus with a low risk NIPS result.  We discussed the adjusted chance for aneuploidy and the limitations of this technology including the false positive and false negative results.  At the time of Mrs. Defrancesco visit, her NIPS results were not available for review.  However, she reports being told that they were normal.   We also discussed that 50-80% of fetuses with Down syndrome and up to 90% of fetuses with trisomies 13 and 18, when well visualized, have detectable anomalies or soft markers by ultrasound.  We discussed  that her ultrasound did not identify any fetal anomalies or soft markers of aneuploidy. She was also counseled regarding diagnostic testing via amniocentesis. We reviewed the approximate 1 in 300-500 risk for complications from amniocentesis, including spontaneous pregnancy loss.  After reviewing these results and the available options, she expressed that she is not interested in pursuing diagnostic testing at this time or in the future, given the associated risk of complications.  She understands that ultrasound and NIPS cannot rule out all birth defects or genetic syndromes.     Both family histories were reviewed.  Mrs. Dalby reported that her husband's mother has congenital hearing loss for which she wears hearing aids bilaterally. While there are other family members who have hearing loss in adulthood, there is no other history of congenital hearing loss.  We discussed that some congenital hearing loss can be genetic, but without further information, a specific recurrence chance would be difficult to determine.  She is aware that all babies are screened for hearing loss prior to discharge after birth.  Mrs. 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  We reviewed her normal hemoglobin electrophoresis results, but discussed that this does not rule out carrier status for alpha-thalassemia We discussed hemoglobinopathies in general, and alpha-thalassemia specifically.  She was counseled that alpha-thalassemia is different from many other autosomal recessive conditions, because there are two alpha globin genes on each chromosome 16 (aa/aa).  A person can be a carrier of one alpha gene mutation (aa/a-), also referred to as a "silent carrier", or of two mutations.  A person who carriers two alpha globin gene mutations can either carry them in cis (both on the  same chromosome, denoted as aa/--) or in trans (on different chromosomes, denoted as a-/a-) .  We discussed that alpha-thalassemia  carriers of two mutations who have African ancestry are more likely to have a trans arrangements (a-/a-), and individuals with Asian ancestry who are alpha thalassemia carriers usually have a cis arrangement (aa/--) of their alpha globin gene mutations.   When a person has more than two alpha-thalassemia mutations, they are considered to have alpha-thalassemia. We discussed different forms of alpha thalassemia. The most severe form of alpha thalassemia, hydrops fetalis with Hb Barts, is associated with an absence of alpha globin chain synthesis as a result of deletions of all four alpha globin genes (--/--).   Hemoglobin H (HbH) disease is caused by three deleted or dysfunctioning alpha globin alleles (a-/--) and is characterized by microcytic hypochromic hemolytic anemia, hepatosplenomegaly, mild jaundice, and sometimes thalassemia-like bone changes.   Mrs. Raulerson reported that she was not sure if she carried one deletion (silent carrier) or two deletions (either in cis or in trans).  However, she reported that her husband had testing in [place] during her pregnancy with their daughter.  She does not know specifically what testing he had performed.  She will attempt to find both her results, to determine what type of carrier she is, and also those results for her husband. She has my contact information and will let me know what she learns.   The remainder of the family histories were reviewed and found to be noncontributory. Without further information regarding the provided family history, an accurate genetic risk cannot be calculated. Further genetic counseling is warranted if more information is obtained.  Mrs. Abid denied exposure to environmental toxins or chemical agents. She denied the use of alcohol, tobacco or street drugs. She denied significant viral illnesses during the course of her pregnancy. Her medical and surgical histories were noncontributory.   I counseled Mrs. Mcphie for approximately 40  minutes regarding the above risks and available options.   Mady Gemma, MS,  Certified Genetic Counselor

## 2015-02-03 ENCOUNTER — Ambulatory Visit: Payer: BC Managed Care – PPO | Admitting: Family Medicine

## 2015-02-04 ENCOUNTER — Other Ambulatory Visit (HOSPITAL_COMMUNITY): Payer: Self-pay | Admitting: Obstetrics & Gynecology

## 2015-02-07 ENCOUNTER — Ambulatory Visit (INDEPENDENT_AMBULATORY_CARE_PROVIDER_SITE_OTHER): Admitting: Family Medicine

## 2015-02-07 ENCOUNTER — Encounter: Payer: Self-pay | Admitting: Family Medicine

## 2015-02-07 VITALS — BP 114/60 | HR 90 | Resp 18 | Ht 66.0 in | Wt 198.1 lb

## 2015-02-07 DIAGNOSIS — Z111 Encounter for screening for respiratory tuberculosis: Secondary | ICD-10-CM | POA: Diagnosis not present

## 2015-02-07 DIAGNOSIS — Z021 Encounter for pre-employment examination: Secondary | ICD-10-CM

## 2015-02-07 NOTE — Progress Notes (Signed)
   Subjective:    Patient ID: Martha [name], female    DOB: [birth year], 34 y.o.   MRN: [id number]  HPI Pt in for employment physical Currently [redacted] weeks pregnant and in gyne care. On medcation for chronic nausea , otherwise take prenatal vitamins and has had nom complications to date in the pregnacy Plans to work in school system, needs basic exam   Review of Systems See HPI Denies recent fever or chills. Denies sinus pressure, nasal congestion, ear pain or sore throat. Denies chest congestion, productive cough or wheezing. Denies chest pains, palpitations and leg swelling Denies abdominal pain,  vomiting,diarrhea or constipation.   Denies dysuria, frequency, hesitancy or incontinence. Denies joint pain, swelling and limitation in mobility. Denies headaches, seizures, numbness, or tingling. Denies depression, anxiety or insomnia. Denies skin break down or rash.        Objective:   Physical Exam BP 114/60 mmHg  Pulse 90  Resp 18  Ht  (1.676 m)  Wt 198 lb 1.3 oz (89.848 kg)  BMI 31.99 kg/m2  SpO2 100%  LMP 09/24/2014  Patient alert and oriented and in no cardiopulmonary distress.  HEENT: No facial asymmetry, EOMI,   oropharynx pink and moist.  Neck supple no JVD, no mass.  Chest: Clear to auscultation bilaterally.  CVS: S1, S2 no murmurs, no S3.Regular rate.  ABD: Soft non tender. Fundal height transabdominal exam is c/w gestational age of [redacted] weeks  Ext: No edema  MS: Adequate ROM spine, shoulders, hips and knees.  Skin: Intact, no ulcerations or rash noted.  Psych: Good eye contact, normal affect. Memory intact not anxious or depressed appearing.  CNS: CN 2-12 intact, power,  normal throughout.no focal deficits noted.        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 Pt will have limitation in lifting due to her pregnancy. TB test placed and read and is negative,

## 2015-02-07 NOTE — Patient Instructions (Addendum)
F/u as needed, return in 2 to 3 days for skin test  It is important that you exercise regularly at least 30 minutes 7 times a week. If you develop chest pain, have severe difficulty breathing, or feel very tired, stop exercising immediately and seek medical attention    A healthy diet is rich in fruit, vegetables and whole grains. Poultry fish, nuts and beans are a healthy choice for protein rather then red meat. A low sodium diet and drinking 64 ounces of water daily is generally recommended. Oils and sweet should be limited. Carbohydrates especially for those who are diabetic or overweight, should be limited to 30-60 gram per meal. It is important to eat on a regular schedule, at least 3 times daily. Snacks should be primarily fruits, vegetables or nuts.

## 2015-03-19 DIAGNOSIS — Z021 Encounter for pre-employment examination: Secondary | ICD-10-CM | POA: Insufficient documentation

## 2015-03-19 NOTE — Assessment & Plan Note (Signed)
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 Pt will have limitation in lifting due to her pregnancy. TB test placed and read and is negative,

## 2015-04-19 ENCOUNTER — Inpatient Hospital Stay (HOSPITAL_COMMUNITY)
Admission: AD | Admit: 2015-04-19 | Discharge: 2015-04-19 | Disposition: A | Discharge status: Home / Self Care | Source: Ambulatory Visit | Attending: Obstetrics and Gynecology | Admitting: Obstetrics and Gynecology

## 2015-04-19 ENCOUNTER — Encounter (HOSPITAL_COMMUNITY): Payer: Self-pay | Admitting: *Deleted

## 2015-04-19 DIAGNOSIS — O98813 Other maternal infectious and parasitic diseases complicating pregnancy, third trimester: Secondary | ICD-10-CM | POA: Diagnosis not present

## 2015-04-19 DIAGNOSIS — R0602 Shortness of breath: Secondary | ICD-10-CM | POA: Diagnosis present

## 2015-04-19 DIAGNOSIS — Z3A29 29 weeks gestation of pregnancy: Secondary | ICD-10-CM | POA: Diagnosis not present

## 2015-04-19 DIAGNOSIS — Z880 Allergy status to penicillin: Secondary | ICD-10-CM | POA: Insufficient documentation

## 2015-04-19 DIAGNOSIS — J209 Acute bronchitis, unspecified: Secondary | ICD-10-CM | POA: Insufficient documentation

## 2015-04-19 DIAGNOSIS — Z3689 Encounter for other specified antenatal screening: Secondary | ICD-10-CM

## 2015-04-19 HISTORY — DX: Hematemesis: K92.0

## 2015-04-19 HISTORY — DX: Heartburn: R12

## 2015-04-19 LAB — INFLUENZA PANEL BY PCR (TYPE A & B)
H1N1FLUPCR: NOT DETECTED
INFLAPCR: NEGATIVE
INFLBPCR: NEGATIVE

## 2015-04-19 MED ORDER — HYDROCOD POLST-CPM POLST ER 10-8 MG/5ML PO SUER: 5.0000 mL | Freq: Two times a day (BID) | ORAL | Status: DC | PRN

## 2015-04-19 MED ORDER — HYDROCOD POLST-CPM POLST ER 10-8 MG/5ML PO SUER
5.0000 mL | Freq: Two times a day (BID) | ORAL | Status: DC | PRN
Start: 2015-04-19 — End: 2015-04-19

## 2015-04-19 NOTE — MAU Provider Note (Signed)
History     CSN: [id number]  Arrival date and time: 04/19/15 1736   None     Chief Complaint  Patient presents with  . Shortness of Breath   HPI Comments: G2P1001 .4 weeks c/o non productive cough x3 days, worsening last night, unable to sleep. Feels like mucous is present but cannot expectorate. Coughing too hard induces emesis. No fever. Some SOB today while at work. No tachypnea. Some urinary incontinence with coughing. Good FM. No ctx, LOF, or VB. No one in household sick. She has received the Influenza vaccine. Pregnancy complicated by abnormal AFP with normal Informaseq.    OB History    Gravida Para Term Preterm AB TAB SAB Ectopic Multiple Living   Past Medical History  Diagnosis Date  . Allergy   . High cholesterol     Past Surgical History  Procedure Laterality Date  . Laser surgery on cervix      to remove polyp  . Cyst removed from right wrist      x2    Family History  Problem Relation Age of Onset  . Cancer Maternal Aunt   . Cancer Maternal Grandmother   . Asthma Mother   . Allergies Mother     Social History  Substance Use Topics  . Smoking status: Never Smoker   . Smokeless tobacco: None  . Alcohol Use: Yes     Comment: socially    Allergies:  Allergies  Allergen Reactions  . Penicillins     REACTION: diarreah    Facility-administered medications prior to admission  Medication Dose Route Frequency Provider Last Rate Last Dose  . Influenza (>/= 3 years) inactive virus vaccine (FLVIRIN/FLUZONE) injection SUSP 0.5 mL  0.5 mL Intramuscular Once Kerri Perches, MD       Prescriptions prior to admission  Medication Sig Dispense Refill Last Dose  . Prenatal Vit-Fe Fumarate-FA (PRENATAL MULTIVITAMIN) TABS tablet Take 1 tablet by mouth daily at 12 noon.   Taking    Review of Systems  Constitutional: Negative.   HENT: Negative.   Eyes: Negative.   Respiratory: Positive for cough and shortness of breath.    Cardiovascular: Negative.   Gastrointestinal: Negative.   Genitourinary: Negative.   Musculoskeletal: Negative.   Skin: Negative.   Neurological: Negative.   Endo/Heme/Allergies: Negative.   Psychiatric/Behavioral: Negative.    Physical Exam   Blood pressure 114/68, pulse 100, temperature 98.2 F (36.8 C), temperature source Oral, resp. rate 18, last menstrual period 09/24/2014.  SpO2 99-100%  Physical Exam  Constitutional: She is oriented to person, place, and time. She appears well-developed and well-nourished.  HENT:  Head: Normocephalic and atraumatic.  Frontal and maxillary tenderness absent  Neck: Normal range of motion. Neck supple.  Cardiovascular: Normal rate and regular rhythm.   Respiratory: Effort normal and breath sounds normal. No respiratory distress. She has no wheezes. She has no rales.  GI: Soft.  gravid  Genitourinary:  deferred  Musculoskeletal: Normal range of motion.  Neurological: She is alert and oriented to person, place, and time.  Skin: Skin is warm and dry.  Psychiatric: She has a normal mood and affect.  EFM: 135 bpm, mod variability, +accels, no decels Toco: none  MAU Course  Procedures Influenza swab pending  Assessment and Plan  29.[redacted] weeks gestation Acute Bronchitis Reactive NST  Discharge home Mucinex OTC 2 po q12 hrs Tussionex 10-8/71mL, 5 mL q12 hrs prn  Steamy showers Rest-OOW tomorrow Increase water intake Call for worsening sx or if not improving in 2-3 days Follow up at [facility] at scheduled   Broghan Pannone, N 04/19/2015, 5:53 PM

## 2015-04-19 NOTE — MAU Note (Signed)
Upper resp sxs started about a week ago. Has continued to worsen and now feels some SOB and tightness in chest when she takes a deep breath or coughs. States cough is productive in the morning, greenish sputum. Later is clear.

## 2015-04-19 NOTE — Discharge Instructions (Signed)

## 2015-05-03 ENCOUNTER — Inpatient Hospital Stay (HOSPITAL_COMMUNITY)
Admission: AD | Admit: 2015-05-03 | Discharge: 2015-05-03 | Disposition: A | Discharge status: Home / Self Care | Source: Ambulatory Visit | Attending: Obstetrics and Gynecology | Admitting: Obstetrics and Gynecology

## 2015-05-03 ENCOUNTER — Encounter (HOSPITAL_COMMUNITY): Payer: Self-pay | Admitting: *Deleted

## 2015-05-03 DIAGNOSIS — Z3A31 31 weeks gestation of pregnancy: Secondary | ICD-10-CM | POA: Insufficient documentation

## 2015-05-03 DIAGNOSIS — K2970 Gastritis, unspecified, without bleeding: Secondary | ICD-10-CM | POA: Diagnosis not present

## 2015-05-03 DIAGNOSIS — O26893 Other specified pregnancy related conditions, third trimester: Secondary | ICD-10-CM | POA: Diagnosis not present

## 2015-05-03 LAB — URINALYSIS, ROUTINE W REFLEX MICROSCOPIC
Bilirubin Urine: NEGATIVE
Glucose, UA: NEGATIVE mg/dL
Hgb urine dipstick: NEGATIVE
KETONES UR: NEGATIVE mg/dL
Leukocytes, UA: NEGATIVE
NITRITE: NEGATIVE
PROTEIN: NEGATIVE mg/dL
Specific Gravity, Urine: 1.02 (ref 1.005–1.030)
Urobilinogen, UA: 1 mg/dL (ref 0.0–1.0)
pH: 6.5 (ref 5.0–8.0)

## 2015-05-03 MED ORDER — M.V.I. ADULT IV INJ
Freq: Once | INTRAVENOUS | Status: AC
  Administered 2015-05-03: 17:00:00 via INTRAVENOUS
  Filled 2015-05-03: qty 1000

## 2015-05-03 MED ORDER — DEXTROSE 5 % IN LACTATED RINGERS IV BOLUS
1000.0000 mL | Freq: Once | INTRAVENOUS | Status: AC
  Administered 2015-05-03: 1000 mL via INTRAVENOUS

## 2015-05-03 MED ORDER — ONDANSETRON HCL 40 MG/20ML IJ SOLN
8.0000 mg | Freq: Once | INTRAMUSCULAR | Status: AC
  Administered 2015-05-03: 8 mg via INTRAVENOUS
  Filled 2015-05-03: qty 4

## 2015-05-03 NOTE — Progress Notes (Signed)
Notified of pt IV fluids being almost finished and pt feeling better. Will put in orders

## 2015-05-03 NOTE — MAU Note (Signed)
Pt has had stomach virus since Saturday.  Has had vomiting, diarrhea stopped yesterday.  Is trying to take sips of liquids.  Has some abd cramping, denies bleeding or LOF.  Advised to come to MAU by Dr. Cherly Hensenousins.

## 2015-05-03 NOTE — MAU Note (Signed)
Pt C/O pain with IV, IV site WNL, no swelling or redness noted.  IV infusing @ bolus rate.  Decreased rate to 500 cc/hr.

## 2015-05-03 NOTE — Discharge Instructions (Signed)

## 2015-05-03 NOTE — MAU Provider Note (Signed)
History     CSN: [id number]  Arrival date and time: 05/03/15 1523  Orders placed in EPIC: 1600 Provider at bedside: 1630     Chief Complaint  Patient presents with  . Emesis During Pregnancy   HPI  [name] is a 34 yo G2P1001 female at 31.[redacted] wks gestation by LMP, sent by office to come for IVFs d/t GI virus.  She reports N/V since Saturday.  She has been unable to hold down food until today.  She had oatmeal and green tea all day today.  No vomiting since arrival to MAU; only once today. She denies VB , LOF or UC's.  She reports (+) FM.  Her prenatal care has been complicated by: h/o abnl pap, h/o laser tx of cervix, abnormal AFP4. Her primary OB provider at [facility] is Dr. Cherly [name].  Past Medical History  Diagnosis Date  . Allergy   . High cholesterol   . Vomiting blood   . Heartburn     Past Surgical History  Procedure Laterality Date  . Laser surgery on cervix      to remove polyp  . Cyst removed from right wrist      x2    Family History  Problem Relation Age of Onset  . Cancer Maternal Aunt   . Cancer Maternal Grandmother   . Asthma Mother   . Allergies Mother     Social History  Substance Use Topics  . Smoking status: Never Smoker   . Smokeless tobacco: None  . Alcohol Use: Yes     Comment: socially    Allergies:  Allergies  Allergen Reactions  . Penicillins Nausea And Vomiting    REACTION: diarreah Has patient had a PCN reaction causing immediate rash, facial/tongue/throat swelling, SOB or lightheadedness with hypotension: No Has patient had a PCN reaction causing severe rash involving mucus membranes or skin necrosis: No Has patient had a PCN reaction that required hospitalization No Has patient had a PCN reaction occurring within the last 10 years: Yes If all of the above answers are "NO", then may proceed with Cephalosporin use.     Prescriptions prior to admission  Medication Sig Dispense Refill Last Dose  . Doxylamine-Pyridoxine (DICLEGIS)  10-10 MG TBEC Take 2 tablets by mouth daily.   05/03/2015 at 1030  . EPINEPHrine (EPIPEN 2-PAK) 0.3 mg/0.3 mL IJ SOAJ injection Inject 0.3 mg into the muscle once.   rescue  . ondansetron (ZOFRAN-ODT) 8 MG disintegrating tablet Take 4-8 mg by mouth every 8 (eight) hours as needed for nausea or vomiting.   05/03/2015 at 1030  . Prenatal Vit-Fe Fumarate-FA (PRENATAL MULTIVITAMIN) TABS tablet Take 1 tablet by mouth daily at 12 noon.   Past Week at Unknown time  . ranitidine (ZANTAC) 150 MG tablet Take 150 mg by mouth 2 (two) times daily.   05/03/2015 at 1030  . chlorpheniramine-HYDROcodone (TUSSIONEX) 10-8 MG/5ML SUER Take 5 mLs by mouth every 12 (twelve) hours as needed for cough. (Patient not taking: Reported on 05/03/2015) 70 mL 0 Not Taking at Unknown time    Review of Systems  Constitutional: Positive for malaise/fatigue.  HENT: Negative.   Eyes: Negative.   Respiratory: Negative.   Cardiovascular: Negative.   Gastrointestinal: Positive for nausea and vomiting.  Genitourinary: Negative.        "dark urine"  Musculoskeletal: Negative.   Neurological: Negative.   Endo/Heme/Allergies: Negative.   Psychiatric/Behavioral: Negative.    Results for orders placed or performed during the hospital encounter of 05/03/15 (  from the past 24 hour(s))  Urinalysis, Routine w reflex microscopic (not at [facility])     Status: None   Collection Time: 05/03/15  3:40 PM  Result Value Ref Range   Color, Urine YELLOW YELLOW   APPearance CLEAR CLEAR   Specific Gravity, Urine 1.020 1.005 - 1.030   pH 6.5 5.0 - 8.0   Glucose, UA NEGATIVE NEGATIVE mg/dL   Hgb urine dipstick NEGATIVE NEGATIVE   Bilirubin Urine NEGATIVE NEGATIVE   Ketones, ur NEGATIVE NEGATIVE mg/dL   Protein, ur NEGATIVE NEGATIVE mg/dL   Urobilinogen, UA 1.0 0.0 - 1.0 mg/dL   Nitrite NEGATIVE NEGATIVE   Leukocytes, UA NEGATIVE NEGATIVE   CEFM  FHR: 135 bpm / moderate variability / accels present / decels absent TOCO: none   Physical Exam    Blood pressure 103/58, pulse 94, temperature 98 F (36.7 C), temperature source Oral, resp. rate 18, last menstrual period 09/24/2014.  Physical Exam  Constitutional:  fatigued  HENT:  Head: Normocephalic.  Eyes: Pupils are equal, round, and reactive to light.  Cardiovascular: Normal rate and regular rhythm.   Respiratory: Effort normal.  GI: Soft. Bowel sounds are normal.  Genitourinary:  Pelvic deferred  Musculoskeletal: Normal range of motion.  Neurological: She is alert.  Skin: Skin is warm and dry.  Psychiatric: She has a normal mood and affect. Her behavior is normal. Judgment and thought content normal.    MAU Course  Procedures CCUA IV Hydration: D5LR 1000 mL at 999 mL / Zofran 8 mg IVPB / D5LR with MVI 1000 mL at 500 mL/hr Assessment and Plan  G2P1001, 31 wks  Gastritis Category 1 FHR tracing Doing well after IVFs / tolerating sips  Discharge Home Zofran as previously prescribed prn Keep scheduled OB appt with Dr. Cherly Hensen Call the office for any further questions problems or concerns  Martha Gower MSN, CNM 05/03/2015, 4:40 PM

## 2015-06-03 LAB — OB RESULTS CONSOLE GBS: GBS: NEGATIVE

## 2015-06-06 ENCOUNTER — Other Ambulatory Visit: Payer: Self-pay | Admitting: Obstetrics and Gynecology

## 2015-06-22 ENCOUNTER — Telehealth (HOSPITAL_COMMUNITY): Payer: Self-pay | Admitting: *Deleted

## 2015-06-22 ENCOUNTER — Encounter (HOSPITAL_COMMUNITY): Payer: Self-pay | Admitting: *Deleted

## 2015-06-22 NOTE — Telephone Encounter (Signed)
Preadmission screen  

## 2015-06-25 ENCOUNTER — Encounter (HOSPITAL_COMMUNITY): Payer: Self-pay

## 2015-06-25 ENCOUNTER — Inpatient Hospital Stay (HOSPITAL_COMMUNITY): Admitting: Anesthesiology

## 2015-06-25 ENCOUNTER — Inpatient Hospital Stay (HOSPITAL_COMMUNITY)
Admission: RE | Admit: 2015-06-25 | Discharge: 2015-06-27 | DRG: 767 | Disposition: A | Discharge status: Home / Self Care | Source: Ambulatory Visit | Attending: Obstetrics and Gynecology | Admitting: Obstetrics and Gynecology

## 2015-06-25 VITALS — BP 107/55 | HR 81 | Temp 98.3°F | Resp 16 | Ht 67.0 in | Wt 213.0 lb

## 2015-06-25 DIAGNOSIS — D696 Thrombocytopenia, unspecified: Secondary | ICD-10-CM | POA: Diagnosis present

## 2015-06-25 DIAGNOSIS — O9912 Other diseases of the blood and blood-forming organs and certain disorders involving the immune mechanism complicating childbirth: Secondary | ICD-10-CM | POA: Diagnosis present

## 2015-06-25 DIAGNOSIS — D62 Acute posthemorrhagic anemia: Secondary | ICD-10-CM | POA: Diagnosis not present

## 2015-06-25 DIAGNOSIS — Z302 Encounter for sterilization: Secondary | ICD-10-CM | POA: Diagnosis not present

## 2015-06-25 DIAGNOSIS — D6959 Other secondary thrombocytopenia: Secondary | ICD-10-CM | POA: Diagnosis present

## 2015-06-25 DIAGNOSIS — Z641 Problems related to multiparity: Secondary | ICD-10-CM

## 2015-06-25 DIAGNOSIS — O9902 Anemia complicating childbirth: Secondary | ICD-10-CM | POA: Diagnosis not present

## 2015-06-25 DIAGNOSIS — O99119 Other diseases of the blood and blood-forming organs and certain disorders involving the immune mechanism complicating pregnancy, unspecified trimester: Secondary | ICD-10-CM | POA: Diagnosis present

## 2015-06-25 DIAGNOSIS — D509 Iron deficiency anemia, unspecified: Secondary | ICD-10-CM | POA: Diagnosis present

## 2015-06-25 DIAGNOSIS — O99019 Anemia complicating pregnancy, unspecified trimester: Secondary | ICD-10-CM | POA: Diagnosis present

## 2015-06-25 DIAGNOSIS — Z3A39 39 weeks gestation of pregnancy: Secondary | ICD-10-CM

## 2015-06-25 HISTORY — DX: Problems related to multiparity: Z64.1

## 2015-06-25 LAB — CBC
HEMATOCRIT: 35.4 % — AB (ref 36.0–46.0)
Hemoglobin: 11.7 g/dL — ABNORMAL LOW (ref 12.0–15.0)
MCH: 27.4 pg (ref 26.0–34.0)
MCHC: 33.1 g/dL (ref 30.0–36.0)
MCV: 82.9 fL (ref 78.0–100.0)
Platelets: 146 10*3/uL — ABNORMAL LOW (ref 150–400)
RBC: 4.27 MIL/uL (ref 3.87–5.11)
RDW: 14.7 % (ref 11.5–15.5)
WBC: 12.4 10*3/uL — AB (ref 4.0–10.5)

## 2015-06-25 LAB — MRSA PCR SCREENING: MRSA by PCR: NEGATIVE

## 2015-06-25 MED ORDER — PHENYLEPHRINE 40 MCG/ML (10ML) SYRINGE FOR IV PUSH (FOR BLOOD PRESSURE SUPPORT)
80.0000 ug | PREFILLED_SYRINGE | INTRAVENOUS | Status: DC | PRN
  Administered 2015-06-25: 80 ug via INTRAVENOUS
  Filled 2015-06-25: qty 20
  Filled 2015-06-25: qty 2

## 2015-06-25 MED ORDER — LACTATED RINGERS IV SOLN
INTRAVENOUS | Status: DC
  Administered 2015-06-25 (×3): via INTRAVENOUS

## 2015-06-25 MED ORDER — OXYTOCIN BOLUS FROM INFUSION: 500.0000 mL | INTRAVENOUS | Status: DC

## 2015-06-25 MED ORDER — CITRIC ACID-SODIUM CITRATE 334-500 MG/5ML PO SOLN: 30.0000 mL | ORAL | Status: DC | PRN

## 2015-06-25 MED ORDER — LACTATED RINGERS IV SOLN: 500.0000 mL | INTRAVENOUS | Status: DC | PRN

## 2015-06-25 MED ORDER — FAMOTIDINE 20 MG PO TABS
40.0000 mg | ORAL_TABLET | Freq: Once | ORAL | Status: AC
  Administered 2015-06-26: 40 mg via ORAL
  Filled 2015-06-25: qty 2

## 2015-06-25 MED ORDER — WITCH HAZEL-GLYCERIN EX PADS: 1.0000 "application " | MEDICATED_PAD | CUTANEOUS | Status: DC | PRN

## 2015-06-25 MED ORDER — ONDANSETRON HCL 4 MG/2ML IJ SOLN: 4.0000 mg | Freq: Four times a day (QID) | INTRAMUSCULAR | Status: DC | PRN

## 2015-06-25 MED ORDER — DIPHENHYDRAMINE HCL 50 MG/ML IJ SOLN: 12.5000 mg | INTRAMUSCULAR | Status: DC | PRN

## 2015-06-25 MED ORDER — LACTATED RINGERS IV SOLN
INTRAVENOUS | Status: DC
Start: 2015-06-25 — End: 2015-06-26
  Administered 2015-06-26 (×2): via INTRAVENOUS

## 2015-06-25 MED ORDER — FERROUS SULFATE 325 (65 FE) MG PO TABS
325.0000 mg | ORAL_TABLET | Freq: Two times a day (BID) | ORAL | Status: DC
  Administered 2015-06-26 – 2015-06-27 (×2): 325 mg via ORAL
  Filled 2015-06-25 (×2): qty 1

## 2015-06-25 MED ORDER — SIMETHICONE 80 MG PO CHEW: 80.0000 mg | CHEWABLE_TABLET | ORAL | Status: DC | PRN

## 2015-06-25 MED ORDER — LIDOCAINE HCL (PF) 1 % IJ SOLN
INTRAMUSCULAR | Status: DC | PRN
  Administered 2015-06-25: 8 mL via EPIDURAL
  Administered 2015-06-25: 7 mL via EPIDURAL

## 2015-06-25 MED ORDER — OXYTOCIN 40 UNITS IN LACTATED RINGERS INFUSION - SIMPLE MED
62.5000 mL/h | INTRAVENOUS | Status: DC
  Filled 2015-06-25: qty 1000

## 2015-06-25 MED ORDER — DEXTROSE IN LACTATED RINGERS 5 % IV SOLN
INTRAVENOUS | Status: DC
  Administered 2015-06-25 – 2015-06-26 (×2): via INTRAVENOUS

## 2015-06-25 MED ORDER — DIBUCAINE 1 % RE OINT: 1.0000 "application " | TOPICAL_OINTMENT | RECTAL | Status: DC | PRN

## 2015-06-25 MED ORDER — OXYTOCIN 10 UNIT/ML IJ SOLN: 10.0000 [IU] | Freq: Once | INTRAMUSCULAR | Status: DC

## 2015-06-25 MED ORDER — LACTATED RINGERS IV SOLN
INTRAVENOUS | Status: DC
  Administered 2015-06-25: 16:00:00 via INTRAUTERINE

## 2015-06-25 MED ORDER — METOCLOPRAMIDE HCL 10 MG PO TABS
10.0000 mg | ORAL_TABLET | Freq: Once | ORAL | Status: AC
  Administered 2015-06-26: 10 mg via ORAL
  Filled 2015-06-25: qty 1

## 2015-06-25 MED ORDER — ACETAMINOPHEN 325 MG PO TABS: 650.0000 mg | ORAL_TABLET | ORAL | Status: DC | PRN

## 2015-06-25 MED ORDER — FAMOTIDINE IN NACL 20-0.9 MG/50ML-% IV SOLN
20.0000 mg | Freq: Two times a day (BID) | INTRAVENOUS | Status: DC
  Administered 2015-06-25: 20 mg via INTRAVENOUS
  Filled 2015-06-25 (×2): qty 50

## 2015-06-25 MED ORDER — ONDANSETRON HCL 4 MG/2ML IJ SOLN: 4.0000 mg | INTRAMUSCULAR | Status: DC | PRN

## 2015-06-25 MED ORDER — OXYCODONE-ACETAMINOPHEN 5-325 MG PO TABS
1.0000 | ORAL_TABLET | ORAL | Status: DC | PRN
  Administered 2015-06-26: 1 via ORAL
  Filled 2015-06-25 (×2): qty 1

## 2015-06-25 MED ORDER — ONDANSETRON HCL 4 MG PO TABS: 4.0000 mg | ORAL_TABLET | ORAL | Status: DC | PRN

## 2015-06-25 MED ORDER — SENNOSIDES-DOCUSATE SODIUM 8.6-50 MG PO TABS
2.0000 | ORAL_TABLET | ORAL | Status: DC
  Administered 2015-06-25 – 2015-06-26 (×2): 2 via ORAL
  Filled 2015-06-25 (×2): qty 2

## 2015-06-25 MED ORDER — EPHEDRINE 5 MG/ML INJ
10.0000 mg | INTRAVENOUS | Status: DC | PRN
  Filled 2015-06-25: qty 2

## 2015-06-25 MED ORDER — TERBUTALINE SULFATE 1 MG/ML IJ SOLN
0.2500 mg | Freq: Once | INTRAMUSCULAR | Status: DC | PRN
  Filled 2015-06-25: qty 1

## 2015-06-25 MED ORDER — IBUPROFEN 600 MG PO TABS
600.0000 mg | ORAL_TABLET | Freq: Four times a day (QID) | ORAL | Status: DC
  Administered 2015-06-25 – 2015-06-27 (×6): 600 mg via ORAL
  Filled 2015-06-25 (×6): qty 1

## 2015-06-25 MED ORDER — LANOLIN HYDROUS EX OINT: TOPICAL_OINTMENT | CUTANEOUS | Status: DC | PRN

## 2015-06-25 MED ORDER — BENZOCAINE-MENTHOL 20-0.5 % EX AERO: 1.0000 "application " | INHALATION_SPRAY | CUTANEOUS | Status: DC | PRN

## 2015-06-25 MED ORDER — OXYCODONE-ACETAMINOPHEN 5-325 MG PO TABS: 2.0000 | ORAL_TABLET | ORAL | Status: DC | PRN

## 2015-06-25 MED ORDER — DIPHENHYDRAMINE HCL 25 MG PO CAPS: 25.0000 mg | ORAL_CAPSULE | Freq: Four times a day (QID) | ORAL | Status: DC | PRN

## 2015-06-25 MED ORDER — PRENATAL MULTIVITAMIN CH
1.0000 | ORAL_TABLET | Freq: Every day | ORAL | Status: DC
  Administered 2015-06-26 – 2015-06-27 (×2): 1 via ORAL
  Filled 2015-06-25 (×2): qty 1

## 2015-06-25 MED ORDER — ZOLPIDEM TARTRATE 5 MG PO TABS: 5.0000 mg | ORAL_TABLET | Freq: Every evening | ORAL | Status: DC | PRN

## 2015-06-25 MED ORDER — FENTANYL 2.5 MCG/ML BUPIVACAINE 1/10 % EPIDURAL INFUSION (WH - ANES)
14.0000 mL/h | INTRAMUSCULAR | Status: DC | PRN
  Administered 2015-06-25: 14 mL/h via EPIDURAL
  Filled 2015-06-25: qty 125

## 2015-06-25 MED ORDER — OXYTOCIN 40 UNITS IN LACTATED RINGERS INFUSION - SIMPLE MED
1.0000 m[IU]/min | INTRAVENOUS | Status: DC
  Administered 2015-06-25: 2 m[IU]/min via INTRAVENOUS
  Administered 2015-06-25: 666 m[IU]/min via INTRAVENOUS

## 2015-06-25 MED ORDER — LIDOCAINE HCL (PF) 1 % IJ SOLN
30.0000 mL | INTRAMUSCULAR | Status: DC | PRN
  Filled 2015-06-25: qty 30

## 2015-06-25 MED ORDER — OXYCODONE-ACETAMINOPHEN 5-325 MG PO TABS: 1.0000 | ORAL_TABLET | ORAL | Status: DC | PRN

## 2015-06-25 NOTE — Progress Notes (Signed)
Requested epidural  O:  Pitocin 16 miu VE: 3/70/-2  Lot ( asynclytic ISE placed IUPC placed  Tracing: baseline 145 (+)  accels Couplets q 2-4 mins  IMP: protracted latent phase due to Asynclitic presentation and dysfunctional labor Term gestation P) right exaggerated sims position with peanut Cont with pitocin

## 2015-06-25 NOTE — Progress Notes (Signed)
Pt has confirmed still desires PPTL. Reviewed risk including infection, bleeding, injury to surrounding organ structure, nonreversible, failure rate 1/300, permanent.. All ? Answered. Consent signed. Scheduled for 06/26/15

## 2015-06-25 NOTE — Anesthesia Procedure Notes (Signed)
Epidural Patient location during procedure: OB Start time: 06/25/2015 1:37 PM End time: 06/25/2015 1:41 PM  Staffing Anesthesiologist: Leilani AbleHATCHETT, Caylah Plouff Performed by: anesthesiologist   Preanesthetic Checklist Completed: patient identified, surgical consent, pre-op evaluation, timeout performed, IV checked, risks and benefits discussed and monitors and equipment checked  Epidural Patient position: sitting Prep: site prepped and draped and DuraPrep Patient monitoring: continuous pulse ox and blood pressure Approach: midline Location: L3-L4 Injection technique: LOR air  Needle:  Needle type: Tuohy  Needle gauge: 17 G Needle length: 9 cm and 9 Needle insertion depth: 5 cm cm Catheter type: closed end flexible Catheter size: 19 Gauge Catheter at skin depth: 10 cm Test dose: negative and Other  Assessment Sensory level: T9 Events: blood not aspirated, injection not painful, no injection resistance, negative IV test and no paresthesia  Additional Notes Reason for block:procedure for pain

## 2015-06-25 NOTE — Progress Notes (Signed)
Called earlier for  Variable declerations. Amnioinfusion ordered Subsequently pt had tachy systole with fetal heart rate deceleration which subsequently resolved, VE 6/90/0 per RN Pitocin off then spaced ctx. Pt was now 8 cm per RN Order to resume pitocin given On arrival to unit, husband report pt c/o rectal pressure. Pitocin had just been restarted VE fully (+) 3 station  IMP: complete P) anticipate SVD

## 2015-06-25 NOTE — Progress Notes (Signed)
S: comfortable C/o heartburn  O: Pitocin 10 miu VE:  2cm/70/-2 post AROM clear fluid IUPC placed  Tracing: baseline 130 (+) accel 150 Ctx q3-4 mins  IMP: Latent phase Term gestation P)cont pitocin. Epidural prn.

## 2015-06-25 NOTE — H&P (Addendum)
Martha [name] is a 34 y.o. female presenting for IOL @ 39 weeks due to social issues( husband military leave). PNC complicated by abnl AFP4 testing( increased DS), nl informaseq and MFM consult sonogram was nl . Maternal Medical History:  Reason for admission: Contractions.   Contractions: Frequency: irregular.    Fetal activity: Perceived fetal activity is normal.    Prenatal complications: no prenatal complications   OB History    Gravida Para Term Preterm AB TAB SAB Ectopic Multiple Living   2 1 1       1      Past Medical History  Diagnosis Date  . Allergy   . High cholesterol   . Vomiting blood   . Heartburn    Past Surgical History  Procedure Laterality Date  . Laser surgery on cervix      to remove polyp  . Cyst removed from right wrist      x2   Family History: family history includes Allergies in her mother; Asthma in her mother; Cancer in her maternal aunt and maternal grandmother. Social History:  reports that she has never smoked. She has never used smokeless tobacco. She reports that she drinks alcohol. She reports that she does not use illicit drugs.   Prenatal Transfer Tool  Maternal Diabetes: No Genetic Screening: Abnormal:  Results: Elevated risk of Trisomy 21 Maternal Ultrasounds/Referrals: Normal Fetal Ultrasounds or other Referrals:  Referred to Materal Fetal Medicine  Maternal Substance Abuse:  No Significant Maternal Medications:  None Significant Maternal Lab Results:  Lab values include: Group B Strep negative Other Comments:  alpha thal trait  Review of Systems  All other systems reviewed and are negative.     Last menstrual period 09/24/2014. Exam Physical Exam  Constitutional: She is oriented to person, place, and time. She appears well-developed and well-nourished.  HENT:  Head: Atraumatic.  Eyes: EOM are normal.  Neck: Neck supple.  Cardiovascular: Normal rate and regular rhythm.   Respiratory: Breath sounds normal.  GI: Soft.   Musculoskeletal: Normal range of motion.  Neurological: She is alert and oriented to person, place, and time.  Skin: Skin is warm and dry.  Psychiatric: She has a normal mood and affect.    Prenatal labs: ABO, Rh: O/Positive/-- (06/23 0000) Antibody: Negative (06/23 0000) Rubella: Immune (06/23 0000) RPR: Nonreactive (06/23 0000)  HBsAg: Negative (06/23 0000)  HIV: Non-reactive (06/23 0000)  GBS: Negative (12/09 0000)  VE 2/70/-2 posterior  Assessment/Plan: Multiparous Term gestation P) admit. Routine labs. Pitocin,. Epidural/analgesic prn. Amniotomy    Perline Awe A 06/25/2015, 7:27 AM

## 2015-06-25 NOTE — Anesthesia Preprocedure Evaluation (Signed)
Anesthesia Evaluation  Patient identified by MRN, date of birth, ID band Patient awake    Reviewed: Allergy & Precautions, H&P , NPO status , Patient's Chart, lab work & pertinent test results  Airway Mallampati: I  TM Distance: >3 FB Neck ROM: full    Dental no notable dental hx.    Pulmonary neg pulmonary ROS,    Pulmonary exam normal        Cardiovascular negative cardio ROS Normal cardiovascular exam     Neuro/Psych negative neurological ROS  negative psych ROS   GI/Hepatic negative GI ROS, Neg liver ROS,   Endo/Other  negative endocrine ROS  Renal/GU negative Renal ROS     Musculoskeletal   Abdominal Normal abdominal exam  (+)   Peds  Hematology   Anesthesia Other Findings   Reproductive/Obstetrics (+) Pregnancy                             Anesthesia Physical Anesthesia Plan  ASA: II  Anesthesia Plan: Epidural   Post-op Pain Management:    Induction:   Airway Management Planned:   Additional Equipment:   Intra-op Plan:   Post-operative Plan:   Informed Consent: I have reviewed the patients History and Physical, chart, labs and discussed the procedure including the risks, benefits and alternatives for the proposed anesthesia with the patient or authorized representative who has indicated his/her understanding and acceptance.     Plan Discussed with:   Anesthesia Plan Comments:         Anesthesia Quick Evaluation  

## 2015-06-26 ENCOUNTER — Inpatient Hospital Stay (HOSPITAL_COMMUNITY): Admitting: Anesthesiology

## 2015-06-26 ENCOUNTER — Encounter (HOSPITAL_COMMUNITY)
Admission: RE | Disposition: A | Discharge status: Home / Self Care | Payer: Self-pay | Source: Ambulatory Visit | Attending: Obstetrics and Gynecology

## 2015-06-26 HISTORY — PX: TUBAL LIGATION: SHX77

## 2015-06-26 LAB — CBC
HCT: 28.8 % — ABNORMAL LOW (ref 36.0–46.0)
HEMOGLOBIN: 9.3 g/dL — AB (ref 12.0–15.0)
MCH: 27 pg (ref 26.0–34.0)
MCHC: 32.3 g/dL (ref 30.0–36.0)
MCV: 83.5 fL (ref 78.0–100.0)
Platelets: 137 10*3/uL — ABNORMAL LOW (ref 150–400)
RBC: 3.45 MIL/uL — AB (ref 3.87–5.11)
RDW: 14.8 % (ref 11.5–15.5)
WBC: 13.8 10*3/uL — ABNORMAL HIGH (ref 4.0–10.5)

## 2015-06-26 LAB — RPR: RPR: NONREACTIVE

## 2015-06-26 SURGERY — LIGATION, FALLOPIAN TUBE, POSTPARTUM
Anesthesia: Spinal | Site: Abdomen | Laterality: Bilateral

## 2015-06-26 MED ORDER — BUPIVACAINE IN DEXTROSE 0.75-8.25 % IT SOLN
INTRATHECAL | Status: DC | PRN
  Administered 2015-06-26: 1.4 mL via INTRATHECAL

## 2015-06-26 MED ORDER — PROMETHAZINE HCL 25 MG/ML IJ SOLN: 6.2500 mg | INTRAMUSCULAR | Status: DC | PRN

## 2015-06-26 MED ORDER — MEPERIDINE HCL 25 MG/ML IJ SOLN: 6.2500 mg | INTRAMUSCULAR | Status: DC | PRN

## 2015-06-26 MED ORDER — NALBUPHINE HCL 10 MG/ML IJ SOLN: 5.0000 mg | INTRAMUSCULAR | Status: DC | PRN

## 2015-06-26 MED ORDER — BUPIVACAINE HCL (PF) 0.25 % IJ SOLN
INTRAMUSCULAR | Status: AC
  Filled 2015-06-26: qty 30

## 2015-06-26 MED ORDER — FENTANYL CITRATE (PF) 100 MCG/2ML IJ SOLN
INTRAMUSCULAR | Status: AC
  Filled 2015-06-26: qty 2

## 2015-06-26 MED ORDER — MIDAZOLAM HCL 2 MG/2ML IJ SOLN
INTRAMUSCULAR | Status: AC
  Filled 2015-06-26: qty 2

## 2015-06-26 MED ORDER — SODIUM CHLORIDE 0.9 % IJ SOLN: 3.0000 mL | INTRAMUSCULAR | Status: DC | PRN

## 2015-06-26 MED ORDER — ONDANSETRON HCL 4 MG/2ML IJ SOLN
INTRAMUSCULAR | Status: AC
  Filled 2015-06-26: qty 2

## 2015-06-26 MED ORDER — KETOROLAC TROMETHAMINE 30 MG/ML IJ SOLN: 30.0000 mg | Freq: Four times a day (QID) | INTRAMUSCULAR | Status: AC | PRN

## 2015-06-26 MED ORDER — NALOXONE HCL 2 MG/2ML IJ SOSY: 1.0000 ug/kg/h | PREFILLED_SYRINGE | INTRAMUSCULAR | Status: DC | PRN

## 2015-06-26 MED ORDER — LACTATED RINGERS IV SOLN: INTRAVENOUS | Status: DC

## 2015-06-26 MED ORDER — FENTANYL CITRATE (PF) 100 MCG/2ML IJ SOLN: 25.0000 ug | INTRAMUSCULAR | Status: DC | PRN

## 2015-06-26 MED ORDER — SCOPOLAMINE 1 MG/3DAYS TD PT72
MEDICATED_PATCH | TRANSDERMAL | Status: AC
  Filled 2015-06-26: qty 1

## 2015-06-26 MED ORDER — NALBUPHINE HCL 10 MG/ML IJ SOLN: 5.0000 mg | Freq: Once | INTRAMUSCULAR | Status: DC | PRN

## 2015-06-26 MED ORDER — BUPIVACAINE HCL (PF) 0.25 % IJ SOLN
INTRAMUSCULAR | Status: DC | PRN
  Administered 2015-06-26: 5 mL

## 2015-06-26 MED ORDER — FENTANYL CITRATE (PF) 100 MCG/2ML IJ SOLN
INTRAMUSCULAR | Status: DC | PRN
  Administered 2015-06-26: 50 ug via INTRAVENOUS
  Administered 2015-06-26: 35 ug via INTRAVENOUS
  Administered 2015-06-26: 15 ug via INTRATHECAL

## 2015-06-26 MED ORDER — SCOPOLAMINE 1 MG/3DAYS TD PT72
1.0000 | MEDICATED_PATCH | Freq: Once | TRANSDERMAL | Status: DC
  Administered 2015-06-26: 1.5 mg via TRANSDERMAL

## 2015-06-26 MED ORDER — NALOXONE HCL 0.4 MG/ML IJ SOLN: 0.4000 mg | INTRAMUSCULAR | Status: DC | PRN

## 2015-06-26 MED ORDER — ONDANSETRON HCL 4 MG/2ML IJ SOLN
INTRAMUSCULAR | Status: DC | PRN
  Administered 2015-06-26: 4 mg via INTRAVENOUS

## 2015-06-26 MED ORDER — MIDAZOLAM HCL 2 MG/2ML IJ SOLN
INTRAMUSCULAR | Status: DC | PRN
  Administered 2015-06-26 (×2): 1 mg via INTRAVENOUS

## 2015-06-26 SURGICAL SUPPLY — 27 items
BENZOIN TINCTURE PRP APPL 2/3 (GAUZE/BANDAGES/DRESSINGS) ×2 IMPLANT
CATH ROBINSON RED A/P 16FR (CATHETERS) IMPLANT
CHLORAPREP W/TINT 26ML (MISCELLANEOUS) ×2 IMPLANT
CLOTH BEACON ORANGE TIMEOUT ST (SAFETY) ×2 IMPLANT
CONTAINER PREFILL 10% NBF 15ML (MISCELLANEOUS) ×4 IMPLANT
DRSG OPSITE POSTOP 3X4 (GAUZE/BANDAGES/DRESSINGS) ×2 IMPLANT
ELECT REM PT RETURN 9FT ADLT (ELECTROSURGICAL) ×2
ELECTRODE REM PT RTRN 9FT ADLT (ELECTROSURGICAL) ×1 IMPLANT
GLOVE BIOGEL PI IND STRL 7.0 (GLOVE) ×3 IMPLANT
GLOVE BIOGEL PI INDICATOR 7.0 (GLOVE) ×3
GLOVE ECLIPSE 6.5 STRL STRAW (GLOVE) ×2 IMPLANT
GOWN STRL REUS W/TWL LRG LVL3 (GOWN DISPOSABLE) ×4 IMPLANT
NEEDLE HYPO 22GX1.5 SAFETY (NEEDLE) ×2 IMPLANT
NS IRRIG 1000ML POUR BTL (IV SOLUTION) ×2 IMPLANT
PACK ABDOMINAL MINOR (CUSTOM PROCEDURE TRAY) ×2 IMPLANT
PENCIL BUTTON HOLSTER BLD 10FT (ELECTRODE) ×2 IMPLANT
SPONGE LAP 4X18 X RAY DECT (DISPOSABLE) IMPLANT
STRIP CLOSURE SKIN 1/4X3 (GAUZE/BANDAGES/DRESSINGS) ×2 IMPLANT
STRIP CLOSURE SKIN 1/4X4 (GAUZE/BANDAGES/DRESSINGS) ×2 IMPLANT
SUT CHROMIC GUT AB #0 18 (SUTURE) ×2 IMPLANT
SUT VIC AB 0 CT1 27 (SUTURE) ×1
SUT VIC AB 0 CT1 27XBRD ANBCTR (SUTURE) ×1 IMPLANT
SUT VICRYL 4-0 PS2 18IN ABS (SUTURE) ×2 IMPLANT
SYR CONTROL 10ML LL (SYRINGE) ×2 IMPLANT
TOWEL OR 17X24 6PK STRL BLUE (TOWEL DISPOSABLE) ×4 IMPLANT
TRAY FOLEY CATH SILVER 14FR (SET/KITS/TRAYS/PACK) IMPLANT
WATER STERILE IRR 1000ML POUR (IV SOLUTION) IMPLANT

## 2015-06-26 NOTE — Anesthesia Procedure Notes (Signed)
Spinal Patient location during procedure: OR Start time: 06/26/2015 9:11 AM End time: 06/26/2015 9:12 AM Staffing Anesthesiologist: Suella Broad D Performed by: anesthesiologist  Preanesthetic Checklist Completed: patient identified, site marked, surgical consent, pre-op evaluation, timeout performed, IV checked, risks and benefits discussed and monitors and equipment checked Spinal Block Patient position: sitting Prep: Betadine Patient monitoring: heart rate, continuous pulse ox, blood pressure and cardiac monitor Approach: midline Location: L4-5 Injection technique: single-shot Needle Needle type: Introducer and Whitacre  Needle gauge: 24 G Needle length: 9 cm Additional Notes Negative paresthesia. Negative blood return. Positive free-flowing CSF. Expiration date of kit checked and confirmed. Patient tolerated procedure well, without complications.

## 2015-06-26 NOTE — Transfer of Care (Signed)
Immediate Anesthesia Transfer of Care Note  Patient: Martha [name]  Procedure(s) Performed: Procedure(s): POST PARTUM TUBAL LIGATION (Bilateral)  Patient Location: PACU  Anesthesia Type:Spinal  Level of Consciousness: awake and alert   Airway & Oxygen Therapy: Patient Spontanous Breathing  Post-op Assessment: Report given to RN and Post -op Vital signs reviewed and stable  Post vital signs: Reviewed and stable  Last Vitals:  Filed Vitals:   06/26/15 0522 06/26/15 0820  BP: 116/73 116/53  Pulse: 96 82  Temp: 36.8 C 36.8 C  Resp: 18 18    Complications: No apparent anesthesia complications

## 2015-06-26 NOTE — Anesthesia Postprocedure Evaluation (Signed)
Anesthesia Post Note  Patient: Martha [name]  Procedure(s) Performed: Procedure(s) (LRB): POST PARTUM TUBAL LIGATION (Bilateral)  Patient location during evaluation: PACU Anesthesia Type: Spinal Level of consciousness: oriented and awake and alert Pain management: pain level controlled Vital Signs Assessment: post-procedure vital signs reviewed and stable Respiratory status: spontaneous breathing, respiratory function stable and patient connected to nasal cannula oxygen Cardiovascular status: blood pressure returned to baseline and stable Postop Assessment: no headache, no backache and spinal receding Anesthetic complications: no    Last Vitals:  Filed Vitals:   06/26/15 1115 06/26/15 1132  BP: 111/66 116/70  Pulse: 75 76  Temp: 36.7 C 36.6 C  Resp: 14 18    Last Pain:  Filed Vitals:   06/26/15 1146  PainSc: 0-No pain                 Shelton SilvasKevin D Mateja Dier

## 2015-06-26 NOTE — Progress Notes (Signed)
BP 139/67 and HR 104, but patient is upset because her [place] left [place] [employer].  Will recheck.

## 2015-06-26 NOTE — Anesthesia Postprocedure Evaluation (Signed)
Anesthesia Post Note  Patient: Martha [name]  Procedure(s) Performed: * No procedures listed *  Patient location during evaluation: Mother Baby Anesthesia Type: Epidural Level of consciousness: awake and alert, oriented and patient cooperative Pain management: pain level controlled Vital Signs Assessment: post-procedure vital signs reviewed and stable Respiratory status: spontaneous breathing Cardiovascular status: stable Postop Assessment: no headache, epidural receding, patient able to bend at knees and no signs of nausea or vomiting Anesthetic complications: no    Last Vitals:  Filed Vitals:   06/26/15 0522 06/26/15 0820  BP: 116/73 116/53  Pulse: 96 82  Temp: 36.8 C 36.8 C  Resp: 18 18    Last Pain:  Filed Vitals:   06/26/15 0826  PainSc: 2                  [facility] Amb[facility] Surgery CenterWRINKLE,Acey [place]

## 2015-06-26 NOTE — Anesthesia Preprocedure Evaluation (Addendum)
Anesthesia Evaluation  Patient identified by MRN, date of birth, ID band Patient awake    Reviewed: Allergy & Precautions, NPO status , Patient's Chart, lab work & pertinent test results  Airway Mallampati: II  TM Distance: >3 FB Neck ROM: Full    Dental  (+) Teeth Intact   Pulmonary neg pulmonary ROS,    breath sounds clear to auscultation       Cardiovascular negative cardio ROS   Rhythm:Regular Rate:Normal     Neuro/Psych negative neurological ROS  negative psych ROS   GI/Hepatic Neg liver ROS, GERD  ,  Endo/Other  negative endocrine ROS  Renal/GU negative Renal ROS  negative genitourinary   Musculoskeletal negative musculoskeletal ROS (+)   Abdominal   Peds  Hematology negative hematology ROS (+)   Anesthesia Other Findings   Reproductive/Obstetrics negative OB ROS                            Lab Results  Component Value Date   WBC 13.8* 06/26/2015   HGB 9.3* 06/26/2015   HCT 28.8* 06/26/2015   MCV 83.5 06/26/2015   PLT 137* 06/26/2015   No results found for: INR, PROTIME   Anesthesia Physical Anesthesia Plan  ASA: II  Anesthesia Plan: Spinal   Post-op Pain Management:    Induction:   Airway Management Planned:   Additional Equipment:   Intra-op Plan:   Post-operative Plan:   Informed Consent: I have reviewed the patients History and Physical, chart, labs and discussed the procedure including the risks, benefits and alternatives for the proposed anesthesia with the patient or authorized representative who has indicated his/her understanding and acceptance.     Plan Discussed with: CRNA  Anesthesia Plan Comments:         Anesthesia Quick Evaluation

## 2015-06-26 NOTE — Progress Notes (Signed)
Patient ID: Aleen CampiAdawne M Aufiero, female   DOB: 02/22/1981, 35 y.o.   MRN: [id number] PPD # 1 SVD  S:  Reports feeling well. Preparing for BTL this morning.             Tolerating po/ No nausea or vomiting             Bleeding is light             Pain controlled with ibuprofen (OTC) and narcotic analgesics including Percocet             Up ad lib / ambulatory / voiding without difficulties    Newborn  Information for the patient's newborn:  Dannial MonarchGlenn, Girl Annamae [[id number]][[id number]]  female  breast feeding    O:  A & O x 3, in no apparent distress              VS:  Filed Vitals:   06/25/15 2000 06/25/15 2100 06/26/15 0100 06/26/15 0522  BP: 113/70 124/69 121/46 116/73  Pulse: 90 93 106 96  Temp: 98.5 F (36.9 C) 98.4 F (36.9 C) 98.4 F (36.9 C) 98.2 F (36.8 C)  TempSrc: Oral Oral Oral Oral  Resp: 18 18 18 18   Height:      Weight:      SpO2: 100% 100% 100%     LABS:  Recent Labs  06/25/15 0805 06/26/15 0530  WBC 12.4* 13.8*  HGB 11.7* 9.3*  HCT 35.4* 28.8*  PLT 146* 137*    Blood type: O/Positive/-- (06/23 0000)  Rubella: Immune (06/23 0000)   I&O: I/O last 3 completed shifts: In: -  Out: 1150 [Urine:800; Blood:350]             Lungs: Clear and unlabored  Heart: regular rate and rhythm / no murmurs  Abdomen: soft, non-tender, non-distended             Fundus: firm, non-tender, U-1  Perineum: vaginal repair healing well  Lochia: minimal  Extremities: Trace edema, no calf pain or tenderness, No Homans - TED hose in place    A/P: PPD # 1  34 y.o., [id number]   Principal Problem:    Postpartum care following vaginal delivery (12/31) with vaginal laceration  Active Problems:    Multiparity   Doing well - stable status  Routine post partum orders  Scheduled for PP BTL at 0915 this AM  Anticipate discharge tomorrow    Kenard GowerAWSON, Yasser Hepp, M, MSN, CNM 06/26/2015, 6:39 AM

## 2015-06-26 NOTE — Brief Op Note (Signed)
06/25/2015 - 06/26/2015  10:38 AM  PATIENT:  Martha [name]  35 y.o. female  PRE-OPERATIVE DIAGNOSIS:  Desires Sterilization, s/p vaginal delivery  POST-OPERATIVE DIAGNOSIS:  Desires Sterilization, s/p vaginal delivery  PROCEDURE:  Procedure(s): POST PARTUM TUBAL LIGATION (Bilateral)( Modified P[place]oy  SURGEON:  Surgeon(s) and Role:    * IT trainerheronette Patti Shorb, MD - Primary  PHYSICIAN ASSISTANT:   ASSISTANTS: none   ANESTHESIA:   spinal Findings: nl tubes and ovaries, uterus @ umb EBL:  Total I/O In: 800 [I.V.:800] Out: 20 [Blood:20]  BLOOD ADMINISTERED:none  DRAINS: none   LOCAL MEDICATIONS USED:  MARCAINE     SPECIMEN:  Source of Specimen:  portion of right and left tubes  DISPOSITION OF SPECIMEN:  PATHOLOGY  COUNTS:  YES  TOURNIQUET:  * No tourniquets in log *  DICTATION: .Other Dictation: Dictation Number [id number]  PLAN OF CARE: Admit to inpatient   PATIENT DISPOSITION:  PACU - hemodynamically stable.   Delay start of Pharmacological VTE agent (>24hrs) due to surgical blood loss or risk of bleeding: no

## 2015-06-26 NOTE — Lactation Note (Signed)
This note was copied from the chart of Martha Amberlea Needham. Lactation Consultation Note; Experienced BF mom. Assisted mom in football position and mom reports this position is much better. Baby latched well and mom reports tugging no pain. Reviewed BF basics with mom. BF brochure given with resources for support after DC. No questions at present. To call prn  Patient Name: Martha [name] ZOXWR'UToday's Date: 06/26/2015 Reason for consult: Initial assessment   Maternal Data Formula Feeding for Exclusion: No Does the patient have breastfeeding experience prior to this delivery?: Yes  Feeding Feeding Type: Breast Fed  LATCH Score/Interventions Latch: Grasps breast easily, tongue down, lips flanged, rhythmical sucking.  Audible Swallowing: A few with stimulation  Type of Nipple: Everted at rest and after stimulation  Comfort (Breast/Nipple): Soft / non-tender     Hold (Positioning): Assistance needed to correctly position infant at breast and maintain latch. Intervention(s): Breastfeeding basics reviewed;Position options  LATCH Score: 8  Lactation Tools Discussed/Used     Consult Status Consult Status: Follow-up Date: 06/27/15 Follow-up type: In-patient    Pamelia HoitWeeks, Kavina Cantave D 06/26/2015, 12:23 PM

## 2015-06-27 DIAGNOSIS — O99119 Other diseases of the blood and blood-forming organs and certain disorders involving the immune mechanism complicating pregnancy, unspecified trimester: Secondary | ICD-10-CM | POA: Diagnosis present

## 2015-06-27 DIAGNOSIS — D509 Iron deficiency anemia, unspecified: Secondary | ICD-10-CM | POA: Diagnosis present

## 2015-06-27 DIAGNOSIS — D696 Thrombocytopenia, unspecified: Secondary | ICD-10-CM | POA: Diagnosis present

## 2015-06-27 DIAGNOSIS — Z302 Encounter for sterilization: Secondary | ICD-10-CM

## 2015-06-27 DIAGNOSIS — O99019 Anemia complicating pregnancy, unspecified trimester: Secondary | ICD-10-CM

## 2015-06-27 DIAGNOSIS — D62 Acute posthemorrhagic anemia: Secondary | ICD-10-CM | POA: Diagnosis not present

## 2015-06-27 HISTORY — DX: Encounter for sterilization: Z30.2

## 2015-06-27 MED ORDER — IBUPROFEN 600 MG PO TABS: 600.0000 mg | ORAL_TABLET | Freq: Four times a day (QID) | ORAL | Status: DC

## 2015-06-27 MED ORDER — FERROUS SULFATE 325 (65 FE) MG PO TABS: 325.0000 mg | ORAL_TABLET | Freq: Every day | ORAL | Status: DC

## 2015-06-27 NOTE — Discharge Summary (Signed)
Obstetric Discharge Summary Reason for Admission: induction of labor and 39.[redacted] weeks gestation Prenatal Course: complicated by abnl AFP4 testing( increased DS), nl informaseq and MFM consult sonogram was nl Intrapartum Procedures: spontaneous vaginal delivery and Pitocin, AROM-clear, epidural Postpartum Procedures: none Complications-Operative and Postpartum: vaginal laceration HEMOGLOBIN  Date Value Ref Range Status  06/26/2015 9.3* 12.0 - 15.0 g/dL Final    Comment:    REPEATED TO VERIFY DELTA CHECK NOTED    HCT  Date Value Ref Range Status  06/26/2015 28.8* 36.0 - 46.0 % Final    Physical Exam:  General: alert, cooperative and no distress Lochia: appropriate Uterine Fundus: firm Incision: healing well, no significant drainage, no dehiscence, no significant erythema DVT Evaluation: No evidence of DVT seen on physical exam. Negative Homan's sign. No cords or calf tenderness. No significant calf/ankle edema.  Discharge Diagnoses: Term Pregnancy-delivered, S/p BTL  Discharge Information: Date: 06/27/2015 Activity: pelvic rest Diet: routine Medications: PNV, Ibuprofen and Iron Condition: stable Instructions: refer to practice specific booklet Discharge to: home Follow-up Information    Follow up with COUSINS,SHERONETTE A, MD. Schedule an appointment as soon as possible for a visit in 6 weeks.   Specialty:  Obstetrics and Gynecology   Contact information:   [address] [place] [id number] [phone]       Newborn Data: Live born female on 06/25/15 Birth Weight: 6 lb 9.6 oz (2995 g) APGAR: 9, 9  Home with mother.  Dawsyn Zurn, N 06/27/2015, 10:51 AM

## 2015-06-27 NOTE — Addendum Note (Signed)
Addendum  created 06/27/15 0754 by Renford DillsJanet L Treyson Axel, CRNA   Modules edited: Clinical Notes   Clinical Notes:  File: [id number]

## 2015-06-27 NOTE — Op Note (Signed)
[name]:  [name], Martha [name]                ACCOUNT NO.:  [id number]  MEDICAL RECORD NO.:  [id number]  LOCATION:  9106                          FACILITY:  [facility]  PHYSICIAN:  Maxie BetterSheronette Arlenne Kimbley, M.D.DATE OF BIRTH:  02-11-1981  DATE OF PROCEDURE:  06/26/2015 DATE OF DISCHARGE:                              OPERATIVE REPORT   PREOPERATIVE DIAGNOSIS:  Desires sterilization, status post vaginal delivery.  PROCEDURE:  Modified [place] postpartum tubal ligation.  POSTOPERATIVE DIAGNOSIS:  Desires sterilization, status post vaginal delivery.  ANESTHESIA:  Spinal.  SURGEON:  Maxie BetterSheronette Myla Mauriello, M.D.  ASSISTANT:  None.  DESCRIPTION OF PROCEDURE:  Under adequate spinal anesthesia, the patient was placed in the supine position.  An indwelling Foley catheter was not placed.  The uterus was at the umbilicus.  After adequate anesthesia level, the abdomen was prepped and draped in usual fashion.  A 0.25% Marcaine was injected infraumbilically.  An infraumbilical scar was then made, carried down to the fascia, which was identified followed by the peritoneum.  Once that was opened, retractors were then used to find the left fallopian tube, which was identified down to its fimbriated end. The ovary was seen and it was normal.  The midportion of the tube was grasped.  Underlying mesosalpinx was opened with cautery.  The proximal and distal portion of the involved tube, tubal segment was tied with 0 chromic sutures x2 proximally and distally, and the intervening segment of tube was then removed.  The same procedure was performed on the contralateral side, again identified the tube down to its fimbriated end first.  The ovary on the right was also seen and was  normal.  The midportion of the right tube was also removed in a similar fashion.  Both portion of the tubes were sent to pathology as right and left fallopian tubes.  The fascia was identified and then closed with 0 Vicryl running stitch.  The skin  was closed with 4-0 Vicryl subcuticular closure.  SPECIMEN:  Portion of right and left fallopian tubes sent to Pathology.  ESTIMATED BLOOD LOSS:  Minimal.  COMPLICATION:  None.  The patient tolerated the procedure well, was transferred to the recovery room in stable condition.     Maxie BetterSheronette Kenedy Haisley, M.D.     [place]/MEDQ  D:  06/26/2015  T:  06/27/2015  Job:  [id number]

## 2015-06-27 NOTE — Progress Notes (Signed)
PPD #2- SVD  Subjective:   Reports feeling well, ready for discharge Tolerating po/ No nausea or vomiting Bleeding is light Pain controlled with Motrin Up ad lib / ambulatory / voiding without problems Newborn: breastfeeding    Objective:   VS: VS:  Filed Vitals:   06/26/15 1742 06/26/15 2200 06/27/15 0145 06/27/15 0624  BP: 111/64 111/55 107/49 107/55  Pulse: 91 88 77 81  Temp: 97.8 F (36.6 C) 98.2 F (36.8 C) 98.4 F (36.9 C) 98.3 F (36.8 C)  TempSrc: Oral Oral Oral Oral  Resp: 18 16 16 16   Height:      Weight:      SpO2:  100%      LABS:  Recent Labs  06/25/15 0805 06/26/15 0530  WBC 12.4* 13.8*  HGB 11.7* 9.3*  PLT 146* 137*   Blood type: O/Positive/-- (06/23 0000) Rubella: Immune (06/23 0000)                I&O: Intake/Output      01/01 0701 - 01/02 0700 01/02 0701 - 01/03 0700   I.V. (mL/kg) 1300 (13.5)    Total Intake(mL/kg) 1300 (13.5)    Urine (mL/kg/hr) 0 (0)    Blood 20 (0)    Total Output 20     Net +1280          Urine Occurrence 1 x      Physical Exam: Alert and oriented X3 Abdomen: soft, non-tender, non-distended  Fundus: firm, non-tender, U-2 Perineum: Well approximated, no significant erythema, edema, or drainage; healing well. Incision: periumbilical, honeycomb c/d/i, no edema or erythema Lochia: small Extremities: No edema, no calf pain or tenderness   Assessment: PPD #2  G2P2002/ S/P:spontaneous vaginal Pos-op BTL Gestational thrombocytopenia, delivered-stable IDA with compounding ABL anemia Doing well - stable for discharge home  Plan: Discharge home RX's:  Ibuprofen 600mg  po Q 6 hrs prn pain #30 Refill x 0 Niferex 150mg  po QD #30 Refill x 1 Follow up in 6 wks at [facility] for postpartum visit [facility] booklet given    Donette LarryBHAMBRI, Tabytha Gradillas, N MSN, CNM 06/27/2015, 10:50 AM

## 2015-06-27 NOTE — Anesthesia Postprocedure Evaluation (Signed)
Anesthesia Post Note  Patient: Martha [name]  Procedure(s) Performed: Procedure(s) (LRB): POST PARTUM TUBAL LIGATION (Bilateral)  Patient location during evaluation: Mother Baby Anesthesia Type: Spinal Level of consciousness: awake and alert Pain management: pain level controlled Vital Signs Assessment: post-procedure vital signs reviewed and stable Respiratory status: spontaneous breathing Cardiovascular status: stable Postop Assessment: no headache, no backache, patient able to bend at knees, no signs of nausea or vomiting and adequate PO intake (spinal worn off) Anesthetic complications: no    Last Vitals:  Filed Vitals:   06/27/15 0145 06/27/15 0624  BP: 107/49 107/55  Pulse: 77 81  Temp: 36.9 C 36.8 C  Resp: 16 16    Last Pain:  Filed Vitals:   06/27/15 0729  PainSc: 0-No pain                 Priest Lockridge

## 2015-06-27 NOTE — Lactation Note (Signed)
This note was copied from the chart of Martha Jazmine Kleinert. Lactation Consultation Note  Patient Name: Martha [name] ZOXWR'UToday's Date: 06/27/2015 Reason for consult: Follow-up assessment   Follow up with experienced BF mom of 39 hour old infant. Infant with 8 BF for 10-30 minutes, 2 attempts, 2 voids and 2 stools in last 24 hours. LATCH scores 8-9 by bedside RN's. Infant weight 6 lb 4.7 oz with 5% weight loss since birth. Infant was latched in cradle hold when I went into room, there was a large space between breast and infant nose, discussed BF basics and enc mom to pull infant in so tip of nose is touching breast at all times with feeding, infant self detached. Mom reports nipple pain with latch, discussed nipple care of EBM, olive oil/coconut oil. Nipples are intact. Mom reports breast feel fuller today. Infant cueing to feed, mom relatched infant. Infant was laying more on back and not aligned with feeding, assisted mom in aligning infant for feeding. Infant with flanged lips, rhythmic suckling and intermittent swallows. Mom with Medela DEBP at home for use.Reviewed all BF information in Taking Care of Baby and Me Booklet. Reviewed Engorgement prevention/treatment with mom. Enc mom to maintain feeding logs and take to Ped visit. Mom to call Ped tomorrow for f/u visit, advised mom BF infants typically need f/u 1-2 days post d/c for weight check. Mom voiced understanding to all teaching. Reviewed LC Brochure, mom is aware of phone #, Support Groups and OP Services. Mom with no firther questions, to call prn.   Maternal Data Formula Feeding for Exclusion: No Has patient been taught Hand Expression?: Yes Does the patient have breastfeeding experience prior to this delivery?: Yes  Feeding Feeding Type: Breast Fed Length of feed: 15 min (still BF when I left room)  LATCH Score/Interventions Latch: Grasps breast easily, tongue down, lips flanged, rhythmical sucking.  Audible Swallowing: Spontaneous  and intermittent Intervention(s): Skin to skin;Hand expression;Alternate breast massage  Type of Nipple: Everted at rest and after stimulation  Comfort (Breast/Nipple): Filling, red/small blisters or bruises, mild/mod discomfort  Problem noted: Mild/Moderate discomfort Interventions (Mild/moderate discomfort): Hand expression (EBM air dried to nipples)  Hold (Positioning): Assistance needed to correctly position infant at breast and maintain latch. Intervention(s): Breastfeeding basics reviewed;Support Pillows;Position options;Skin to skin  LATCH Score: 8  Lactation Tools Discussed/Used WIC Program: No Pump Review: Milk Storage   Consult Status Consult Status: Complete Follow-up type: Call as needed    Martha [name] 06/27/2015, 9:12 AM

## 2015-06-28 ENCOUNTER — Encounter (HOSPITAL_COMMUNITY): Payer: Self-pay | Admitting: Obstetrics and Gynecology

## 2015-07-07 ENCOUNTER — Ambulatory Visit (INDEPENDENT_AMBULATORY_CARE_PROVIDER_SITE_OTHER): Admitting: Family Medicine

## 2015-07-07 ENCOUNTER — Encounter: Payer: Self-pay | Admitting: Family Medicine

## 2015-07-07 VITALS — BP 116/72 | HR 96 | Temp 99.0°F | Resp 18 | Ht 66.0 in | Wt 200.1 lb

## 2015-07-07 DIAGNOSIS — L309 Dermatitis, unspecified: Secondary | ICD-10-CM | POA: Diagnosis not present

## 2015-07-07 DIAGNOSIS — J019 Acute sinusitis, unspecified: Secondary | ICD-10-CM | POA: Diagnosis not present

## 2015-07-07 DIAGNOSIS — J0100 Acute maxillary sinusitis, unspecified: Secondary | ICD-10-CM

## 2015-07-07 DIAGNOSIS — J209 Acute bronchitis, unspecified: Secondary | ICD-10-CM | POA: Diagnosis not present

## 2015-07-07 MED ORDER — ERYTHROMYCIN BASE 500 MG PO TABS: 500.0000 mg | ORAL_TABLET | Freq: Three times a day (TID) | ORAL | Status: DC

## 2015-07-07 NOTE — Patient Instructions (Addendum)
F/u as needed , call if needed Erythromycin sent for sinus infection and bronchitis and will treat any bacerial skin infection  Do not scratch, talk with ped re benadryl. Half tab at bedtime  Low dose hydrocortisone cream sparingly to itchy areas on hands will heklp dermatitis

## 2015-07-07 NOTE — Progress Notes (Signed)
   Subjective:    Patient ID: Martha [name], female    DOB: 06/05/1981, 35 y.o.   MRN: [id number]  HPI 1 week h/o worsening head and chest congestion, associated with fever and chills intermittently. Nasal drainage has thickened , and is yellowish green, and at times bloody. Sputum is thick and yellow. C/o bilateral ear pressure, denies hearing loss and sore throat. Increasing fatigue , poor appetitie and sleep disturbed by cough. No improvement with OTC medication. Currently breastfeeding and is 2 weeks post partum    Review of Systems See HPI  Denies chest pains, palpitations and leg swelling Denies abdominal pain, nausea, vomiting,diarrhea or constipation.   Denies dysuria, frequency, hesitancy or incontinence. Denies joint pain, swelling and limitation in mobility. Denies headaches, seizures, numbness, or tingling. Denies depression, anxiety or insomnia. Denies skin break down or rash.        Objective:   Physical Exam  BP 116/72 mmHg  Pulse 96  Temp(Src) 99 F (37.2 C)  Resp 18  Ht 5\' 6"  (1.676 m)  Wt 200 lb 1.9 oz (90.774 kg)  BMI 32.32 kg/m2  SpO2 100%  LMP 09/24/2014  Breastfeeding? Yes  Patient alert and oriented and in no cardiopulmonary distress.  HEENT: No facial asymmetry, EOMI,   oropharynx pink and moist.  Neck supple no JVD, no mass. TM clear bilaterally, Maxillary sinus tenderness,anterior  cervical adenitis Chest: Adequate air entry bilaterally , few scattered crackles, no wheezes  CVS: S1, S2 no murmurs, no S3.Regular rate.  ABD: Soft non tender.   Ext: No edema  MS: Adequate ROM spine, shoulders, hips and knees.  Skin: Intact, hyperpigmented nodules present , resembling scars from insect bites  Psych: Good eye contact, normal affect. Memory intact not anxious or depressed appearing.  CNS: CN 2-12 intact, power,  normal throughout.no focal deficits noted.       Assessment & Plan:  Acute sinusitis 10 day course of erythromycin  prescribed  Acute bronchitis Erythromycin prescribed  Dermatitis Dryness and hyperpigmentation of skin on dorsum of left hand, and several hyperpigmented lesions on forearms, resembling scars from bites, no bacteria superinfection present. Symptomatic treatment recommended sparing topical hydrocortisone cream (OTC) as needed, and benadryl orally for excess itching, to be cleared by pediatrician as she is breastfeeding  Breast feeding status of mother Currently breast feeding 622 week old infant, caution with oral medication needed

## 2015-07-10 ENCOUNTER — Encounter: Payer: Self-pay | Admitting: Family Medicine

## 2015-07-10 DIAGNOSIS — L309 Dermatitis, unspecified: Secondary | ICD-10-CM | POA: Insufficient documentation

## 2015-07-10 DIAGNOSIS — J209 Acute bronchitis, unspecified: Secondary | ICD-10-CM | POA: Insufficient documentation

## 2015-07-10 DIAGNOSIS — J019 Acute sinusitis, unspecified: Secondary | ICD-10-CM | POA: Insufficient documentation

## 2015-07-10 NOTE — Assessment & Plan Note (Signed)
Currently breast feeding 832 week old infant, caution with oral medication needed

## 2015-07-10 NOTE — Assessment & Plan Note (Signed)
Erythromycin prescribed 

## 2015-07-10 NOTE — Assessment & Plan Note (Signed)
10 day course of erythromycin prescribed

## 2015-07-10 NOTE — Assessment & Plan Note (Signed)
Dryness and hyperpigmentation of skin on dorsum of left hand, and several hyperpigmented lesions on forearms, resembling scars from bites, no bacteria superinfection present. Symptomatic treatment recommended sparing topical hydrocortisone cream (OTC) as needed, and benadryl orally for excess itching, to be cleared by pediatrician as she is breastfeeding

## 2015-07-14 ENCOUNTER — Telehealth: Payer: Self-pay

## 2015-07-14 ENCOUNTER — Telehealth: Payer: Self-pay | Admitting: Family Medicine

## 2015-07-14 DIAGNOSIS — L309 Dermatitis, unspecified: Secondary | ICD-10-CM

## 2015-07-14 NOTE — Telephone Encounter (Signed)
pls get soonest available appt somewhere , thanks

## 2015-07-14 NOTE — Telephone Encounter (Signed)
Will enter referral for dermatology.  1st available.

## 2015-07-14 NOTE — Telephone Encounter (Signed)
Direct call to pt, states itching is worse than the rash Wants to get rid of rash. Pediatrician advised her not even to take benadryl unless she had to. She is breast feeding She is to call back with dermatology options who will see her with her current ins Stopped e mycin due to abdomina lpain

## 2015-07-14 NOTE — Telephone Encounter (Signed)
Attempted to schedule patient an appointment.   Her insurance is out of network with all dermatologists that I researched.  Called and left voicemail requesting that she call insurance and check to see who is in network.  Will continue to work on this.

## 2016-12-13 ENCOUNTER — Telehealth: Payer: Self-pay | Admitting: Orthopedic Surgery

## 2016-12-13 NOTE — Telephone Encounter (Signed)
Call received from patient, inquiring about previous medical records, specifically surgery, by Dr Romeo AppleHarrison as far back as possibly 2004. Discussed process of records requests, including signing authorization to release information. Also reviewed chart, which may require more research to locate operative records from that time frame. Patient may also try contacting [facility]; will let us know if needs to come in to sign release for the records which we have located.

## 2017-07-15 DIAGNOSIS — M25831 Other specified joint disorders, right wrist: Secondary | ICD-10-CM | POA: Insufficient documentation

## 2018-06-25 DIAGNOSIS — G96198 Other disorders of meninges, not elsewhere classified: Secondary | ICD-10-CM

## 2018-06-25 HISTORY — DX: Other disorders of meninges, not elsewhere classified: G96.198

## 2018-07-10 DIAGNOSIS — G5620 Lesion of ulnar nerve, unspecified upper limb: Secondary | ICD-10-CM | POA: Insufficient documentation

## 2018-07-10 DIAGNOSIS — M419 Scoliosis, unspecified: Secondary | ICD-10-CM | POA: Insufficient documentation

## 2018-07-10 DIAGNOSIS — M5136 Other intervertebral disc degeneration, lumbar region: Secondary | ICD-10-CM | POA: Insufficient documentation

## 2018-07-10 DIAGNOSIS — M545 Low back pain, unspecified: Secondary | ICD-10-CM | POA: Insufficient documentation

## 2018-07-10 DIAGNOSIS — G5601 Carpal tunnel syndrome, right upper limb: Secondary | ICD-10-CM | POA: Insufficient documentation

## 2018-09-11 DIAGNOSIS — G930 Cerebral cysts: Secondary | ICD-10-CM | POA: Insufficient documentation

## 2019-12-04 ENCOUNTER — Encounter: Payer: Self-pay | Admitting: Family Medicine

## 2019-12-04 ENCOUNTER — Ambulatory Visit: Payer: BC Managed Care – PPO | Admitting: Family Medicine

## 2019-12-04 ENCOUNTER — Other Ambulatory Visit: Payer: Self-pay

## 2019-12-04 VITALS — BP 118/81 | HR 78 | Temp 97.1°F | Ht 66.0 in | Wt 207.0 lb

## 2019-12-04 DIAGNOSIS — O99019 Anemia complicating pregnancy, unspecified trimester: Secondary | ICD-10-CM

## 2019-12-04 DIAGNOSIS — D509 Iron deficiency anemia, unspecified: Secondary | ICD-10-CM | POA: Diagnosis not present

## 2019-12-04 DIAGNOSIS — K921 Melena: Secondary | ICD-10-CM

## 2019-12-04 DIAGNOSIS — G930 Cerebral cysts: Secondary | ICD-10-CM

## 2019-12-04 NOTE — Patient Instructions (Addendum)
I appreciate the opportunity to provide you with care for your health and wellness. Today we discussed: established care   Follow up: 3-4 months -by phone   Labs in 3 months   Referrals today: Gi; Neurology; Neurosurgery  Great to meet you today!  Have a great Summer :)  Please continue to practice social distancing to keep you, your family, and our community safe.  If you must go out, please wear a mask and practice good handwashing.  It was a pleasure to see you and I look forward to continuing to work together on your health and well-being. Please do not hesitate to call the office if you need care or have questions about your care.  Have a wonderful day and week. With Gratitude, Tereasa Coop, DNP, AGNP-BC

## 2019-12-04 NOTE — Progress Notes (Signed)
Subjective:  Patient ID: Martha [name], female    DOB: [birth year]  Age: 39 y.o. MRN: [id number]  CC:  Chief Complaint  Patient presents with  . New Patient (Initial Visit)      HPI  HPI [name] is a 39 year old female patient who was previously a patient of Dr. Griffin Dakin prior to moving to [place] because her husband is in [employer].  She is now back in this area of [place] and needs to be set up again with primary care and a few other referral providers.  She has a history that includes but is not limited to allergies, anemia, dyslipidemia, vitamin D deficiency, anxiety depression heartburn, hot cholesterol, recent discovery of lesions on brain MRI.,  Arachnoid cyst.  Also has had some hematology concerns secondary to having low hemoglobin.  Most recent hemoglobin was 11.2.  With hematocrit of 36.  MCV of 82.  This was in March 2021.  She was advised to be referred to a GI specialist from her doctor down in [place] area secondary to having tarry stools but she reports that she has been taking oral iron once daily.  She denies having any frank or bright blood.  She needs a referral to the neurologist secondary to lesions in the brain and the arachnoid cyst.  She was referred to neurosurgery but they decided to not do any particular procedure secondary to Covid.  Of note her father has myasthenia gravis.  Additional lab ferritin was 23.4, T IBC was 389, Iron was 19, saturation 5%, folate 9.8%, vitamin B12 367, A1c 5.3%  She goes to [facility] for her Pap smear needs.  Cycles are good.  She does have a cyst on her ovary but overall doing well. She sees a Pharmacist, community regularly.  She sees an eye doctor regularly.  And wears glasses.  We will have her sign for records to be released.  She denies having any other issues or concerns today except for to establish and get referrals. She denies having any chest pain, palpitations, leg swelling, headaches, dizziness,  vision changes, cough, fever, shortness of breath or chills.  Today patient denies signs and symptoms of COVID 19 infection including fever, chills, cough, shortness of breath, and headache. Past Medical, Surgical, Social History, Allergies, and Medications have been Reviewed.   Past Medical History:  Diagnosis Date  . Allergy   . Anemia    Phreesia 12/03/2019  . ANEMIA 08/09/2010   Qualifier: Diagnosis of  By: Cori Razor LPN, Brandi    . Breast feeding status of mother 07/07/2015  . Heartburn   . High cholesterol   . Multiparity 06/25/2015  . Postpartum care following vaginal delivery (12/31) 06/25/2015  . Status post tubal ligation at time of delivery, current hospitalization 06/27/2015  . Vomiting blood     No outpatient medications have been marked as taking for the 12/04/19 encounter (Office Visit) with Perlie Mayo, NP.    ROS:  Review of Systems  Constitutional: Negative.   HENT: Negative.   Eyes: Negative.   Respiratory: Negative.   Cardiovascular: Negative.   Gastrointestinal: Negative.   Genitourinary: Negative.   Musculoskeletal: Negative.   Skin: Negative.   Neurological: Negative.   Endo/Heme/Allergies: Negative.   Psychiatric/Behavioral: Negative.   All other systems reviewed and are negative.    Objective:   Today's Vitals: BP 118/81 (BP Location: Right Arm, Patient Position: Sitting, Cuff Size: Normal)   Pulse 78   Temp (!) 97.1 F (36.2  C) (Temporal)   Ht 5\' 6"  (1.676 m)   Wt 207 lb (93.9 kg)   LMP 11/30/2019   SpO2 99%   BMI 33.41 kg/m  Vitals with BMI 12/04/2019 07/07/2015 06/27/2015  Height 5\' 6"  5\' 6"  -  Weight 207 lbs 200 lbs 2 oz -  BMI 33.43 32.4 -  Systolic 118 116 08/25/2015  Diastolic 81 72 55  Pulse 78 96 81     Physical Exam Vitals and nursing note reviewed.  Constitutional:      Appearance: Normal appearance. She is well-developed and well-groomed. She is obese.  HENT:     Head: Normocephalic and atraumatic.     Right Ear: External ear  normal.     Left Ear: External ear normal.     Mouth/Throat:     Comments: Mask in place Eyes:     General:        Right eye: No discharge.        Left eye: No discharge.     Conjunctiva/sclera: Conjunctivae normal.  Cardiovascular:     Rate and Rhythm: Normal rate and regular rhythm.     Pulses: Normal pulses.     Heart sounds: Normal heart sounds.  Pulmonary:     Effort: Pulmonary effort is normal.     Breath sounds: Normal breath sounds.  Musculoskeletal:        General: Normal range of motion.     Cervical back: Normal range of motion and neck supple.  Skin:    General: Skin is warm.  Neurological:     General: No focal deficit present.     Mental Status: She is alert and oriented to person, place, and time.  Psychiatric:        Attention and Perception: Attention normal.        Mood and Affect: Mood normal.        Speech: Speech normal.        Behavior: Behavior normal. Behavior is cooperative.        Thought Content: Thought content normal.        Cognition and Memory: Cognition normal.        Judgment: Judgment normal.       Assessment   1. Iron deficiency anemia, unspecified iron deficiency anemia type   2. Complaint of melena   3. Arachnoid cyst   4. Iron deficiency anemia of pregnancy     Tests ordered Orders Placed This Encounter  Procedures  . CBC  . COMPLETE METABOLIC PANEL WITH GFR  . Fe+TIBC+Fer  . Ambulatory referral to Gastroenterology  . Ambulatory referral to Neurosurgery     Plan: Please see assessment and plan per problem list above.   No orders of the defined types were placed in this encounter.   Patient to follow-up in 03/11/2020   , NP

## 2019-12-09 ENCOUNTER — Encounter: Payer: Self-pay | Admitting: Family Medicine

## 2019-12-09 ENCOUNTER — Other Ambulatory Visit: Payer: Self-pay

## 2019-12-09 DIAGNOSIS — G96198 Other disorders of meninges, not elsewhere classified: Secondary | ICD-10-CM

## 2019-12-10 ENCOUNTER — Encounter: Payer: Self-pay | Admitting: Family Medicine

## 2019-12-10 DIAGNOSIS — K921 Melena: Secondary | ICD-10-CM | POA: Insufficient documentation

## 2019-12-10 NOTE — Assessment & Plan Note (Signed)
Advised to take iron twice daily.  Will get iron check prior to next appointment if still low will look at hematology needs.  Additionally educated on the fact that iron can also make her stools dark so she might not be losing blood in her stool referral to GI for better assessment.

## 2019-12-10 NOTE — Assessment & Plan Note (Signed)
Melena could be related to iron intake.  Referral to GI.

## 2019-12-10 NOTE — Assessment & Plan Note (Addendum)
Per previous note finding is seen.  Was offered a procedure however she had wanted to observe this.  Referral to neurosurgery so that she can get work-up.  If she has any new complaints such as worsening gait, spasticity, weakness or bowel or bladder incontinence she was advised to seek help quicker.

## 2019-12-14 ENCOUNTER — Encounter: Payer: Self-pay | Admitting: Internal Medicine

## 2020-01-07 ENCOUNTER — Encounter: Payer: Self-pay | Admitting: Family Medicine

## 2020-01-07 ENCOUNTER — Ambulatory Visit: Payer: BC Managed Care – PPO | Admitting: Family Medicine

## 2020-01-08 ENCOUNTER — Ambulatory Visit: Payer: BC Managed Care – PPO | Admitting: Family Medicine

## 2020-01-29 ENCOUNTER — Other Ambulatory Visit: Payer: Self-pay

## 2020-01-29 ENCOUNTER — Ambulatory Visit: Payer: BC Managed Care – PPO | Admitting: Diagnostic Neuroimaging

## 2020-01-29 ENCOUNTER — Encounter: Payer: Self-pay | Admitting: Family Medicine

## 2020-01-29 ENCOUNTER — Encounter: Payer: Self-pay | Admitting: Diagnostic Neuroimaging

## 2020-01-29 VITALS — BP 123/73 | HR 88 | Ht 66.0 in | Wt 207.2 lb

## 2020-01-29 DIAGNOSIS — R200 Anesthesia of skin: Secondary | ICD-10-CM

## 2020-01-29 DIAGNOSIS — R413 Other amnesia: Secondary | ICD-10-CM

## 2020-01-29 NOTE — Patient Instructions (Signed)
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

## 2020-01-29 NOTE — Progress Notes (Signed)
[facility]  PATIENT: Martha [name] DOB: [birth year]  REFERRING CLINICIAN: Perlie Mayo, NP HISTORY FROM: patient  REASON FOR VISIT: new consult    HISTORICAL  CHIEF COMPLAINT:  Chief Complaint  Patient presents with  . New Patient (Initial Visit)    Internal referral, referred by Cherly Beach NP, for arachnoid cyst of spine. Brought CD MRI.    HISTORY OF PRESENT ILLNESS:   39 year old female here for evaluation of numbness, memory loss, abnormal MRI brain and arachnoid cyst of spine.  2019 patient noticed some intermittent numbness sensations in her bilateral legs.  She also noted some memory loss issues.  Some intermittent blurred vision issues.  Patient had MRI of the cervical, thoracic and lumbar spine in February 2020 which showed a possible arachnoid cyst at the T2-3 level.  This was followed up by CT myelogram in [place].  She was evaluated by neurosurgery who recommended to monitor arachnoid cyst.  Patient also had MRI of the brain in August 2020 which showed few nonspecific T2 hyperintensities.  She followed up with neurology in [place] and recommended to follow these conservatively.  Patient followed up with ophthalmology for vision testing, had new glasses, retinal testing, which were unremarkable.  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  Her sleep is somewhat interrupted at night by children waking up.  Patient has not been able to be as active with physical exercise lately due to busy schedule.    REVIEW OF SYSTEMS: Full 14 system review of systems performed and negative with exception of: As per HPI.  ALLERGIES: Allergies  Allergen Reactions  . Penicillins Nausea And Vomiting    REACTION: diarreah Has patient had a PCN reaction causing immediate rash, facial/tongue/throat swelling, SOB or lightheadedness with  hypotension: No Has patient had a PCN reaction causing severe rash involving mucus membranes or skin necrosis: No Has patient had a PCN reaction that required hospitalization No Has patient had a PCN reaction occurring within the last 10 years: Yes If all of the above answers are "NO", then may proceed with Cephalosporin use.     HOME MEDICATIONS: Outpatient Medications Prior to Visit  Medication Sig Dispense Refill  . ferrous sulfate 325 (65 FE) MG EC tablet Take 65 mg by mouth daily.     No facility-administered medications prior to visit.    PAST MEDICAL HISTORY: Past Medical History:  Diagnosis Date  . Allergy   . Anemia    Phreesia 12/03/2019  . ANEMIA 08/09/2010   Qualifier: Diagnosis of  By: Cori Razor LPN, Brandi    . Breast feeding status of mother 07/07/2015  . Heartburn   . High cholesterol   . Multiparity 06/25/2015  . Postpartum care following vaginal delivery (12/31) 06/25/2015  . Status post tubal ligation at time of delivery, current hospitalization 06/27/2015  . Vomiting blood     PAST SURGICAL HISTORY: Past Surgical History:  Procedure Laterality Date  . cyst removed from right wrist     x2  . laser surgery on cervix     to remove polyp  . TUBAL LIGATION Bilateral 06/26/2015   Procedure: POST PARTUM TUBAL LIGATION;  Surgeon: Servando Salina, MD;  Location: [facility];  Service: Gynecology;  Laterality: Bilateral;    FAMILY HISTORY: Family History  Problem Relation Age of Onset  . Asthma Mother   . Allergies Mother   .  Cancer Maternal Aunt   . Cancer Maternal Grandmother     SOCIAL HISTORY: Social History   Socioeconomic History  . Marital status: Married    Spouse name: Yong Channel   . Number of children: 2  . Years of education: Not on file  . Highest education level: Bachelor's degree (e.g., BA, AB, BS)  Occupational History  . Occupation: Pharmacist, hospital    Comment: special education  Tobacco Use  . Smoking status: Never Smoker  . Smokeless tobacco: Never  Used  Substance and Sexual Activity  . Alcohol use: Yes    Comment: socially  . Drug use: No  . Sexual activity: Yes    Birth control/protection: Surgical  Other Topics Concern  . Not on file  Social History Narrative   Lives with Hunter-husband 10 years and children: Aspen 6 and Rhyland 4      Enjoys: cooking, teaching      Diet: eats all food groups- no pork   Caffeine: 1 cup of coffee, tea daily   Water: 4-5 daily         Wears seat belt   Does not use phone while driving   Oceanographer at home   [employer]             Social Determinants of Health   Financial Resource Strain: Low Risk   . Difficulty of Paying Living Expenses: Not hard at all  Food Insecurity: No Food Insecurity  . Worried About Charity fundraiser in the Last Year: Never true  . Ran Out of Food in the Last Year: Never true  Transportation Needs: No Transportation Needs  . Lack of Transportation (Medical): No  . Lack of Transportation (Non-Medical): No  Physical Activity: Insufficiently Active  . Days of Exercise per Week: 2 days  . Minutes of Exercise per Session: 30 min  Stress: No Stress Concern Present  . Feeling of Stress : Only a little  Social Connections: Moderately Integrated  . Frequency of Communication with Friends and Family: More than three times a week  . Frequency of Social Gatherings with Friends and Family: Once a week  . Attends Religious Services: More than 4 times per year  . Active Member of Clubs or Organizations: No  . Attends Archivist Meetings: Never  . Marital Status: Married  Human resources officer Violence: Not At Risk  . Fear of Current or Ex-Partner: No  . Emotionally Abused: No  . Physically Abused: No  . Sexually Abused: No     PHYSICAL EXAM  GENERAL EXAM/CONSTITUTIONAL: Vitals:  Vitals:   01/29/20 0927  BP: 123/73  Pulse: 88  Weight: 207 lb 3.2 oz (94 kg)  Height: _0  (1.676 m)     Body mass index is 33.44 kg/m. Wt Readings  from Last 3 Encounters:  01/29/20 207 lb 3.2 oz (94 kg)  12/04/19 207 lb (93.9 kg)  07/07/15 200 lb 1.9 oz (90.8 kg)     Patient is in no distress; well developed, nourished and groomed; neck is supple  CARDIOVASCULAR:  Examination of carotid arteries is normal; no carotid bruits  Regular rate and rhythm, no murmurs  Examination of peripheral vascular system by observation and palpation is normal  EYES:  Ophthalmoscopic exam of optic discs and posterior segments is normal; no papilledema or hemorrhages  No exam data present  MUSCULOSKELETAL:  Gait, strength, tone, movements noted in Neurologic exam below  NEUROLOGIC: MENTAL STATUS:  No flowsheet data found.  awake, alert, oriented to  person, place and time  recent and remote memory intact  normal attention and concentration  language fluent, comprehension intact, naming intact  fund of knowledge appropriate  CRANIAL NERVE:   2nd - no papilledema on fundoscopic exam  2nd, 3rd, 4th, 6th - pupils equal and reactive to light, visual fields full to confrontation, extraocular muscles intact, no nystagmus  5th - facial sensation symmetric  7th - facial strength symmetric  8th - hearing intact  9th - palate elevates symmetrically, uvula midline  11th - shoulder shrug symmetric  12th - tongue protrusion midline  MOTOR:   normal bulk and tone, full strength in the BUE, BLE  SENSORY:   normal and symmetric to light touch, temperature, vibration  COORDINATION:   finger-nose-finger, fine finger movements normal  REFLEXES:   deep tendon reflexes present and symmetric  GAIT/STATION:   narrow based gait     DIAGNOSTIC DATA (LABS, IMAGING, TESTING) - I reviewed patient records, labs, notes, testing and imaging myself where available.  Lab Results  Component Value Date   WBC 13.8 (H) 06/26/2015   HGB 9.3 (L) 06/26/2015   HCT 28.8 (L) 06/26/2015   MCV 83.5 06/26/2015   PLT 137 (L) 06/26/2015       Component Value Date/Time   NA 138 01/30/2011 1103   K 4.6 01/30/2011 1103   CL 105 01/30/2011 1103   CO2 24 01/30/2011 1103   GLUCOSE 84 01/30/2011 1103   BUN 8 01/30/2011 1103   CREATININE 0.90 01/30/2011 1103   CALCIUM 9.5 01/30/2011 1103   GFRNONAA >60 07/20/2009 1511   GFRAA  07/20/2009 1511    >60        The eGFR has been calculated using the MDRD equation. This calculation has not been validated in all clinical situations. eGFR's persistently <60 mL/min signify possible Chronic Kidney Disease.   Lab Results  Component Value Date   CHOL 173 10/21/2012   HDL 48 10/21/2012   LDLCALC 111 (H) 10/21/2012   TRIG 68 10/21/2012   CHOLHDL 3.6 10/21/2012   Lab Results  Component Value Date   HGBA1C 5.3 10/21/2012   Lab Results  Component Value Date   VITAMINB12 323 01/30/2011   Lab Results  Component Value Date   TSH 1.620 10/21/2012    02/10/19 MRI brain [I reviewed images myself and agree with interpretation. -[facility]P]  - few non-specific foci of T2 hyperintensities  07/26/18 MRI cervical spine  - mild disc bulging C5-6 - partially visualized arachnoid cyst at T2-3  07/26/18 MRI thoracic spine - mild flattening of spinal cord; possible arachnoid cyst; T2 to T3-4 levels  07/26/18 MRI lumbar spine - mild disc bulging L3-4 to L5-S1    ASSESSMENT AND PLAN  39 y.o. year old female here with:   Ddx:  1. Memory loss   2. Numbness       PLAN:  MEMORY LOSS / NUMBNESS (non-specific findings; memory loss could be related to sleep and stress issues; numbness could be related to mild intermittent  lumbar radiculitis) - request prior neurology notes ([place]) - check B12, TSH, A1 - monitor symptoms  THORACIC SPINE ARACHNOID CYST (likely incidental finding, asymptomatic) - monitor; follow up with neurosurgery consult  Orders Placed This Encounter  Procedures  . Vitamin B12  . TSH  . Hemoglobin A1c   Return for pending if symptoms worsen or fail to  improve.    Penni Bombard, MD 11/27/9933, 7:01 AM Certified in Neurology, Neurophysiology and Neuroimaging  [facility]  [address] [place], [place] [address] [phone]

## 2020-01-30 LAB — HEMOGLOBIN A1C
Est. average glucose Bld gHb Est-mCnc: 111 mg/dL
Hgb A1c MFr Bld: 5.5 % (ref 4.8–5.6)

## 2020-01-30 LAB — VITAMIN B12: Vitamin B-12: 835 pg/mL (ref 232–1245)

## 2020-01-30 LAB — TSH: TSH: 1.67 u[IU]/mL (ref 0.450–4.500)

## 2020-02-02 ENCOUNTER — Encounter: Payer: Self-pay | Admitting: *Deleted

## 2020-02-03 ENCOUNTER — Ambulatory Visit: Payer: BC Managed Care – PPO | Admitting: Gastroenterology

## 2020-02-03 ENCOUNTER — Other Ambulatory Visit: Payer: Self-pay

## 2020-02-03 ENCOUNTER — Encounter: Payer: Self-pay | Admitting: Gastroenterology

## 2020-02-03 VITALS — BP 114/74 | HR 82 | Temp 98.1°F | Ht 66.0 in | Wt 204.6 lb

## 2020-02-03 DIAGNOSIS — D509 Iron deficiency anemia, unspecified: Secondary | ICD-10-CM | POA: Diagnosis not present

## 2020-02-03 DIAGNOSIS — R12 Heartburn: Secondary | ICD-10-CM | POA: Insufficient documentation

## 2020-02-03 NOTE — Patient Instructions (Signed)
1. Colonoscopy with possible upper endoscopy.  2. When you go for labs for Tereasa Coop, DNP, please take my lab orders with you. They will not duplicate any labs but will put my name on the order too so we both get results.  3. Collect stool specimen and return to our office.  4. If you notice black sticky stools again, please call our office. If associated with weakness, lightheaded, fatigue, please go to the ER.

## 2020-02-03 NOTE — Progress Notes (Signed)
Primary Care Physician:  Freddy Finner, NP  Primary Gastroenterologist:  Roetta Sessions, MD   Chief Complaint  Patient presents with  . Anemia    heartburn, black sticky stools    HPI:  Martha [name] is a 39 y.o. female here at the request of Tereasa Coop, NP for further evaluation of IDA, black stools.  Patient seen by Dr. Jena Gauss remotely. EGD April 2007: Very small hiatal hernia.EGD August 2011: Small hiatal hernia.  Labs in March 2021 per PCP showed hemoglobin 11.2, hematocrit 36, MCV 82.  Also with ferritin of 23, TIBC 389, iron 19, saturations 5%, folate 9.8, vitamin B12 367.  Patient was advised to go to see GI specialist for tarry stools when she was still living in [place] earlier this year.  She recently moved back to the area.  She states her stools had been dark sticky earlier in the year. None lately. No brbpr. Alternating constipation and diarrhea, controls for the most part with diet and water intake. Occasionally has to take imodium. Denies Pepto use. Denies heavy menses. One period a month, one day heavy bleeding. States she had significant anemia during her pregnancies, had back to back pregnancies and youngest child is four. Taking one iron daily since march. Tried to increase to two a day recently but did not tolerate. No asa/nsaids. A lot of heartburn off and on. Takes Nexium 24 hour when needed. Avoids trigger foods. No dysphagia.    Current Outpatient Medications  Medication Sig Dispense Refill  . ferrous sulfate 325 (65 FE) MG EC tablet Take 65 mg by mouth daily.    . Multiple Vitamin (MULTIVITAMIN ADULT) TABS Take by mouth daily.     No current facility-administered medications for this visit.    Allergies as of 02/03/2020 - Review Complete 02/03/2020  Allergen Reaction Noted  . Penicillins Nausea And Vomiting 08/23/2010    Past Medical History:  Diagnosis Date  . Allergy   . Anemia    Phreesia 12/03/2019  . ANEMIA 08/09/2010   Qualifier: Diagnosis of   By: Lillia Mountain LPN, Brandi    . Breast feeding status of mother 07/07/2015  . Heartburn   . High cholesterol   . Multiparity 06/25/2015  . Postpartum care following vaginal delivery (12/31) 06/25/2015  . Status post tubal ligation at time of delivery, current hospitalization 06/27/2015  . Vomiting blood     Past Surgical History:  Procedure Laterality Date  . cyst removed from right wrist     x2  . laser surgery on cervix     to remove polyp  . TUBAL LIGATION Bilateral 06/26/2015   Procedure: POST PARTUM TUBAL LIGATION;  Surgeon: Maxie Better, MD;  Location: [facility];  Service: Gynecology;  Laterality: Bilateral;    Family History  Problem Relation Age of Onset  . Asthma Mother   . Allergies Mother   . Cancer Maternal Aunt        facial  . Cancer Maternal Grandmother   . Colon cancer Neg Hx   . Inflammatory bowel disease Neg Hx   . Celiac disease Neg Hx     Social History   Socioeconomic History  . Marital status: Married    Spouse name: [name]   . Number of children: 2  . Years of education: Not on file  . Highest education level: Bachelor's degree (e.g., BA, AB, BS)  Occupational History  . Occupation: Runner, broadcasting/film/video    Comment: special education  Tobacco Use  . Smoking status: Never Smoker  .  Smokeless tobacco: Never Used  Substance and Sexual Activity  . Alcohol use: Yes    Comment: socially  . Drug use: No  . Sexual activity: Yes    Birth control/protection: Surgical  Other Topics Concern  . Not on file  Social History Narrative   Lives with Hunter-husband 10 years and children: Aspen 6 and Rhyland 4      Enjoys: cooking, teaching      Diet: eats all food groups- no pork   Caffeine: 1 cup of coffee, tea daily   Water: 4-5 daily         Wears seat belt   Does not use phone while driving   Psychologist, sport and exercise at home   [employer]             Social Determinants of Health   Financial Resource Strain: Low Risk   . Difficulty of Paying Living Expenses:  Not hard at all  Food Insecurity: No Food Insecurity  . Worried About Programme researcher, broadcasting/film/video in the Last Year: Never true  . Ran Out of Food in the Last Year: Never true  Transportation Needs: No Transportation Needs  . Lack of Transportation (Medical): No  . Lack of Transportation (Non-Medical): No  Physical Activity: Insufficiently Active  . Days of Exercise per Week: 2 days  . Minutes of Exercise per Session: 30 min  Stress: No Stress Concern Present  . Feeling of Stress : Only a little  Social Connections: Moderately Integrated  . Frequency of Communication with Friends and Family: More than three times a week  . Frequency of Social Gatherings with Friends and Family: Once a week  . Attends Religious Services: More than 4 times per year  . Active Member of Clubs or Organizations: No  . Attends Banker Meetings: Never  . Marital Status: Married  Catering manager Violence: Not At Risk  . Fear of Current or Ex-Partner: No  . Emotionally Abused: No  . Physically Abused: No  . Sexually Abused: No      ROS:  General: Negative for anorexia, weight loss, fever, chills, fatigue, weakness. Eyes: Negative for vision changes.  OQH:UTMLYYTK for hoarseness, difficulty swallowing , nasal congestion. CV: Negative for chest pain, angina, palpitations, dyspnea on exertion, peripheral edema.  Respiratory: Negative for dyspnea at rest, dyspnea on exertion, cough, sputum, wheezing.  GI: See history of present illness. GU:  Negative for dysuria, hematuria, urinary incontinence, urinary frequency, nocturnal urination.  MS: Negative for joint pain, low back pain.  Derm: Negative for rash or itching.  Neuro: Negative for weakness, abnormal sensation, seizure, frequent headaches, memory loss, confusion.  Psych: Negative for anxiety, depression, suicidal ideation, hallucinations.  Endo: Negative for unusual weight change.  Heme: Negative for bruising or bleeding. Allergy: Negative for  rash or hives.    Physical Examination:  BP 114/74   Pulse 82   Temp 98.1 F (36.7 C) (Oral)   Ht 5\' 6"  (1.676 m)   Wt 204 lb 9.6 oz (92.8 kg)   LMP 01/24/2020   BMI 33.02 kg/m    General: Well-nourished, well-developed in no acute distress.  Head: Normocephalic, atraumatic.   Eyes: Conjunctiva pink, no icterus. Mouth:masked. Neck: Supple without thyromegaly, masses, or lymphadenopathy.  Lungs: Clear to auscultation bilaterally.  Heart: Regular rate and rhythm, no murmurs rubs or gallops.  Abdomen: Bowel sounds are normal, nontender, nondistended, no hepatosplenomegaly or masses, no abdominal bruits or    hernia , no rebound or guarding.   Rectal:  not peformed Extremities: No lower extremity edema. No clubbing or deformities.  Neuro: Alert and oriented x 4 , grossly normal neurologically.  Skin: Warm and dry, no rash or jaundice.   Psych: Alert and cooperative, normal mood and affect.  Labs: Lab Results  Component Value Date   HGBA1C 5.5 01/29/2020   Lab Results  Component Value Date   VITAMINB12 835 01/29/2020   Lab Results  Component Value Date   TSH 1.670 01/29/2020       Imaging Studies: No results found.  Impression/Plan:  Pleasant 39 year old female presenting for further evaluation of iron deficiency anemia and black stools.  She has baseline alternating constipation and diarrhea, reflux which she tries to control with diet.  Denies any NSAID or aspirin use.  She reports history of anemia for quite some time.  First mention during her pregnancies.  Last pregnancy 4 years ago.  Has been on oral iron for more than 6 months.  Back in the spring she noted black sticky stools.  Has not had any for some time.  No fresh blood per rectum.  Denies heavy menses.  Doubt we are dealing with true melena. Given iron deficiency anemia would plan on colonoscopy with possible upper endoscopy.  Patient is appropriate for conscious sedation. ASA II.   I have discussed the  risks, alternatives, benefits with regards to but not limited to the risk of reaction to medication, bleeding, infection, perforation and the patient is agreeable to proceed. Written consent to be obtained.  We will check FOBT.  Update labs in the near future including CBC, iron/ferritin, celiac serologies.  If she notices recurrent black sticky stools especially if associated with weakness, lightheadedness, fatigue she should go to the emergency department.

## 2020-02-04 ENCOUNTER — Ambulatory Visit (INDEPENDENT_AMBULATORY_CARE_PROVIDER_SITE_OTHER): Payer: BC Managed Care – PPO | Admitting: Gastroenterology

## 2020-02-04 DIAGNOSIS — D509 Iron deficiency anemia, unspecified: Secondary | ICD-10-CM | POA: Diagnosis not present

## 2020-02-04 LAB — IFOBT (OCCULT BLOOD): IFOBT: NEGATIVE

## 2020-02-11 ENCOUNTER — Telehealth: Payer: Self-pay

## 2020-02-11 NOTE — Telephone Encounter (Signed)
Called pt, TCS/-/+EGD w/Dr. Jena Gauss scheduled for 03/30/20 at 9:00am. COVID test 03/29/20 8:05am. Appt letter mailed with procedure instructions. Orders entered.

## 2020-02-27 LAB — COMPLETE METABOLIC PANEL WITH GFR
AG Ratio: 1.4 (calc) (ref 1.0–2.5)
ALT: 14 U/L (ref 6–29)
AST: 17 U/L (ref 10–30)
Albumin: 4.2 g/dL (ref 3.6–5.1)
Alkaline phosphatase (APISO): 73 U/L (ref 31–125)
BUN: 11 mg/dL (ref 7–25)
CO2: 26 mmol/L (ref 20–32)
Calcium: 9.4 mg/dL (ref 8.6–10.2)
Chloride: 106 mmol/L (ref 98–110)
Creat: 0.9 mg/dL (ref 0.50–1.10)
GFR, Est African American: 95 mL/min/{1.73_m2} (ref >=60)
GFR, Est Non African American: 82 mL/min/{1.73_m2} (ref >=60)
Globulin: 3 g/dL (calc) (ref 1.9–3.7)
Glucose, Bld: 77 mg/dL (ref 65–99)
Potassium: 4.3 mmol/L (ref 3.5–5.3)
Sodium: 139 mmol/L (ref 135–146)
Total Bilirubin: 0.4 mg/dL (ref 0.2–1.2)
Total Protein: 7.2 g/dL (ref 6.1–8.1)

## 2020-03-01 LAB — CBC WITH DIFFERENTIAL/PLATELET
Absolute Monocytes: 389 cells/uL (ref 200–950)
Basophils Absolute: 30 cells/uL (ref 0–200)
Basophils Relative: 0.5 %
Eosinophils Absolute: 53 cells/uL (ref 15–500)
Eosinophils Relative: 0.9 %
HCT: 35 % (ref 35.0–45.0)
Hemoglobin: 11.1 g/dL — ABNORMAL LOW (ref 11.7–15.5)
Lymphs Abs: 1528 cells/uL (ref 850–3900)
MCH: 25.8 pg — ABNORMAL LOW (ref 27.0–33.0)
MCHC: 31.7 g/dL — ABNORMAL LOW (ref 32.0–36.0)
MCV: 81.4 fL (ref 80.0–100.0)
MPV: 11.9 fL (ref 7.5–12.5)
Monocytes Relative: 6.6 %
Neutro Abs: 3900 cells/uL (ref 1500–7800)
Neutrophils Relative %: 66.1 %
Platelets: 235 10*3/uL (ref 140–400)
RBC: 4.3 10*6/uL (ref 3.80–5.10)
RDW: 13.4 % (ref 11.0–15.0)
Total Lymphocyte: 25.9 %
WBC: 5.9 10*3/uL (ref 3.8–10.8)

## 2020-03-01 LAB — TISSUE TRANSGLUTAMINASE, IGA: (tTG) Ab, IgA: 1 U/mL

## 2020-03-01 LAB — IRON,TIBC AND FERRITIN PANEL
%SAT: 13 % (calc) — ABNORMAL LOW (ref 16–45)
Ferritin: 32 ng/mL (ref 16–154)
Iron: 46 ug/dL (ref 40–190)
TIBC: 367 mcg/dL (calc) (ref 250–450)

## 2020-03-01 LAB — IGA: Immunoglobulin A: 221 mg/dL (ref 47–310)

## 2020-03-11 ENCOUNTER — Telehealth: Payer: BC Managed Care – PPO | Admitting: Family Medicine

## 2020-03-11 ENCOUNTER — Other Ambulatory Visit: Payer: Self-pay

## 2020-03-11 DIAGNOSIS — D649 Anemia, unspecified: Secondary | ICD-10-CM

## 2020-03-16 ENCOUNTER — Encounter: Payer: Self-pay | Admitting: Family Medicine

## 2020-03-16 ENCOUNTER — Other Ambulatory Visit: Payer: Self-pay

## 2020-03-16 ENCOUNTER — Telehealth (INDEPENDENT_AMBULATORY_CARE_PROVIDER_SITE_OTHER): Payer: BC Managed Care – PPO | Admitting: Family Medicine

## 2020-03-16 VITALS — BP 114/74 | Ht 66.0 in | Wt 204.0 lb

## 2020-03-16 DIAGNOSIS — G930 Cerebral cysts: Secondary | ICD-10-CM

## 2020-03-16 NOTE — Progress Notes (Signed)
Virtual Visit via Telephone Note   This visit type was conducted due to national recommendations for restrictions regarding the COVID-19 Pandemic (e.g. social distancing) in an effort to limit this patient's exposure and mitigate transmission in our community.  Due to her co-morbid illnesses, this patient is at least at moderate risk for complications without adequate follow up.  This format is felt to be most appropriate for this patient at this time.  The patient did not have access to video technology/had technical difficulties with video requiring transitioning to audio format only (telephone).  All issues noted in this document were discussed and addressed.  No physical exam could be performed with this format.    Evaluation Performed:  Follow-up visit  Date:  03/16/2020   ID:  Martha [name], DOB 02/24/1981, MRN [id number]  Patient Location: Home Provider Location: Office/Clinic  Location of Patient: Home Location of Provider: Telehealth Consent was obtain for visit to be over via telehealth. I verified that I am speaking with the correct person using two identifiers.  PCP:  Freddy Finner, NP   Chief Complaint: 86-month follow-up  History of Present Illness:    Martha [name] is a 39 y.o. female with significant history that includes anemia, hyperlipidemia, heartburn, arachnoid cyst, carpal tunnel, scoliosis, prediabetes, melena.  She reports overall she is doing very well.  Does not have any issues or concerns today.  She reports that she will be getting an endoscopy and colonoscopy in the beginning of October.  She has followed up with a neurologist.  She has not followed up with neurosurgery as of yet.  But will reach out to call them. She denies having any cough, fever, chills, chest pain, palpitations, leg swelling, dizziness or vision changes.  The patient does not have symptoms concerning for COVID-19 infection (fever, chills, cough, or new shortness of breath).   Past  Medical, Surgical, Social History, Allergies, and Medications have been Reviewed.  Past Medical History:  Diagnosis Date  . Allergy   . Anemia    Phreesia 12/03/2019  . ANEMIA 08/09/2010   Qualifier: Diagnosis of  By: Lillia Mountain LPN, Brandi    . Breast feeding status of mother 07/07/2015  . Heartburn   . High cholesterol   . Multiparity 06/25/2015  . Postpartum care following vaginal delivery (12/31) 06/25/2015  . Status post tubal ligation at time of delivery, current hospitalization 06/27/2015  . Vomiting blood    Past Surgical History:  Procedure Laterality Date  . cyst removed from right wrist     X3  . ESOPHAGOGASTRODUODENOSCOPY  09/2005   Dr. Jena Gauss: very small hiatal hernia  . ESOPHAGOGASTRODUODENOSCOPY  01/2010   Dr. Jena Gauss: small hiatal hernia  . laser surgery on cervix     to remove polyp  . TUBAL LIGATION Bilateral 06/26/2015   Procedure: POST PARTUM TUBAL LIGATION;  Surgeon: Maxie Better, MD;  Location: [facility];  Service: Gynecology;  Laterality: Bilateral;     Current Meds  Medication Sig  . ferrous sulfate 325 (65 FE) MG EC tablet Take 65 mg by mouth daily.  . Multiple Vitamin (MULTIVITAMIN ADULT) TABS Take by mouth daily.     Allergies:   Penicillins   ROS:   Please see the history of present illness.    All other systems reviewed and are negative.   Labs/Other Tests and Data Reviewed:    Recent Labs: 01/29/2020: TSH 1.670 02/26/2020: ALT 14; BUN 11; Creat 0.90; Hemoglobin 11.1; Platelets 235; Potassium 4.3;  Sodium 139   Recent Lipid Panel Lab Results  Component Value Date/Time   CHOL 173 10/21/2012 07:30 AM   TRIG 68 10/21/2012 07:30 AM   HDL 48 10/21/2012 07:30 AM   CHOLHDL 3.6 10/21/2012 07:30 AM   LDLCALC 111 (H) 10/21/2012 07:30 AM    Wt Readings from Last 3 Encounters:  03/16/20 204 lb (92.5 kg)  02/03/20 204 lb 9.6 oz (92.8 kg)  01/29/20 207 lb 3.2 oz (94 kg)     Objective:    Vital Signs:  BP 114/74   Ht 5\' 6"  (1.676 m)   Wt 204 lb (92.5  kg)   BMI 32.93 kg/m    VITAL SIGNS:  reviewed GEN:  Alert and oriented RESPIRATORY:  No shortness of breath noted in conversation PSYCH:  normal affect  ASSESSMENT & PLAN:    1. Arachnoid cyst Will be following up with neurosurgery referral advised to call back if she has any issues or concerns or trouble and a referral will be replaced again.   Time:   Today, I have spent 5 minutes with the patient with telehealth technology discussing the above problems.     Medication Adjustments/Labs and Tests Ordered: Current medicines are reviewed at length with the patient today.  Concerns regarding medicines are outlined above.   Tests Ordered: No orders of the defined types were placed in this encounter.   Medication Changes: No orders of the defined types were placed in this encounter.   Disposition:  Follow up 6 months  Signed, , NP  03/16/2020 11:32 AM     03/18/2020 [facility]

## 2020-03-29 ENCOUNTER — Other Ambulatory Visit (HOSPITAL_COMMUNITY)
Admission: RE | Admit: 2020-03-29 | Discharge: 2020-03-29 | Disposition: A | Discharge status: Home / Self Care | Payer: BC Managed Care – PPO | Source: Ambulatory Visit | Attending: Internal Medicine | Admitting: Internal Medicine

## 2020-03-29 ENCOUNTER — Other Ambulatory Visit: Payer: Self-pay

## 2020-03-29 DIAGNOSIS — Z01812 Encounter for preprocedural laboratory examination: Secondary | ICD-10-CM | POA: Diagnosis not present

## 2020-03-29 DIAGNOSIS — Z20822 Contact with and (suspected) exposure to covid-19: Secondary | ICD-10-CM | POA: Diagnosis not present

## 2020-03-29 LAB — SARS CORONAVIRUS 2 (TAT 6-24 HRS): SARS Coronavirus 2: NEGATIVE

## 2020-03-30 ENCOUNTER — Encounter (HOSPITAL_COMMUNITY)
Admission: RE | Disposition: A | Discharge status: Home / Self Care | Payer: Self-pay | Source: Home / Self Care | Attending: Internal Medicine

## 2020-03-30 ENCOUNTER — Ambulatory Visit (HOSPITAL_COMMUNITY)
Admission: RE | Admit: 2020-03-30 | Discharge: 2020-03-30 | Disposition: A | Discharge status: Home / Self Care | Payer: BC Managed Care – PPO | Attending: Internal Medicine | Admitting: Internal Medicine

## 2020-03-30 ENCOUNTER — Encounter (HOSPITAL_COMMUNITY): Payer: Self-pay | Admitting: Internal Medicine

## 2020-03-30 ENCOUNTER — Other Ambulatory Visit: Payer: Self-pay

## 2020-03-30 DIAGNOSIS — Z880 Allergy status to penicillin: Secondary | ICD-10-CM | POA: Diagnosis not present

## 2020-03-30 DIAGNOSIS — Z8759 Personal history of other complications of pregnancy, childbirth and the puerperium: Secondary | ICD-10-CM | POA: Insufficient documentation

## 2020-03-30 DIAGNOSIS — K449 Diaphragmatic hernia without obstruction or gangrene: Secondary | ICD-10-CM | POA: Diagnosis not present

## 2020-03-30 DIAGNOSIS — K2100 Gastro-esophageal reflux disease with esophagitis, without bleeding: Secondary | ICD-10-CM | POA: Insufficient documentation

## 2020-03-30 DIAGNOSIS — D649 Anemia, unspecified: Secondary | ICD-10-CM | POA: Diagnosis not present

## 2020-03-30 DIAGNOSIS — K209 Esophagitis, unspecified without bleeding: Secondary | ICD-10-CM

## 2020-03-30 DIAGNOSIS — E7800 Pure hypercholesterolemia, unspecified: Secondary | ICD-10-CM | POA: Diagnosis not present

## 2020-03-30 DIAGNOSIS — D509 Iron deficiency anemia, unspecified: Secondary | ICD-10-CM | POA: Diagnosis not present

## 2020-03-30 DIAGNOSIS — Z9851 Tubal ligation status: Secondary | ICD-10-CM | POA: Diagnosis not present

## 2020-03-30 HISTORY — PX: BIOPSY: SHX5522

## 2020-03-30 HISTORY — PX: COLONOSCOPY: SHX5424

## 2020-03-30 HISTORY — PX: ESOPHAGOGASTRODUODENOSCOPY: SHX5428

## 2020-03-30 SURGERY — COLONOSCOPY
Anesthesia: Moderate Sedation

## 2020-03-30 MED ORDER — ONDANSETRON HCL 4 MG/2ML IJ SOLN
INTRAMUSCULAR | Status: AC
  Filled 2020-03-30: qty 2

## 2020-03-30 MED ORDER — LIDOCAINE VISCOUS HCL 2 % MT SOLN
OROMUCOSAL | Status: DC | PRN
  Administered 2020-03-30: 1 via OROMUCOSAL

## 2020-03-30 MED ORDER — SODIUM CHLORIDE 0.9 % IV SOLN
INTRAVENOUS | Status: DC
  Administered 2020-03-30: 1000 mL via INTRAVENOUS

## 2020-03-30 MED ORDER — MEPERIDINE HCL 50 MG/ML IJ SOLN
INTRAMUSCULAR | Status: DC
Start: 2020-03-30 — End: 2020-03-30
  Filled 2020-03-30: qty 1

## 2020-03-30 MED ORDER — LIDOCAINE VISCOUS HCL 2 % MT SOLN
OROMUCOSAL | Status: AC
  Filled 2020-03-30: qty 15

## 2020-03-30 MED ORDER — MEPERIDINE HCL 100 MG/ML IJ SOLN
INTRAMUSCULAR | Status: DC | PRN
  Administered 2020-03-30: 25 mg
  Administered 2020-03-30: 15 mg

## 2020-03-30 MED ORDER — STERILE WATER FOR IRRIGATION IR SOLN
Status: DC | PRN
  Administered 2020-03-30: 100 mL

## 2020-03-30 MED ORDER — MIDAZOLAM HCL 5 MG/5ML IJ SOLN
INTRAMUSCULAR | Status: AC
  Filled 2020-03-30: qty 10

## 2020-03-30 MED ORDER — MIDAZOLAM HCL 5 MG/5ML IJ SOLN
INTRAMUSCULAR | Status: DC | PRN
  Administered 2020-03-30: 1 mg via INTRAVENOUS
  Administered 2020-03-30: 2 mg via INTRAVENOUS
  Administered 2020-03-30 (×4): 1 mg via INTRAVENOUS
  Administered 2020-03-30: 2 mg via INTRAVENOUS

## 2020-03-30 MED ORDER — ONDANSETRON HCL 4 MG/2ML IJ SOLN
INTRAMUSCULAR | Status: DC | PRN
  Administered 2020-03-30: 4 mg via INTRAVENOUS

## 2020-03-30 NOTE — Op Note (Signed)
[facility] Patient Name: Martha [name] Procedure Date: 03/30/2020 9:25 AM MRN: [id number] Date of Birth: 08/29/1980 Attending MD: Gennette Pacobert Michael Glenmore Karl , MD CSN: [id number] Age: [age] Admit Type: Outpatient Procedure:                Upper GI endoscopy Indications:              Unexplained iron deficiency anemia Providers:                Gennette Pacobert Michael Ronee Ranganathan, MD, Sheran FavaJessica Boudreaux, Jannett CelestineAnitra                            Bell, RN, Pandora LeiterNeville David, Technician Referring MD:              Medicines:                Midazolam 9 mg IV, Meperidine 40 mg IV Complications:            No immediate complications. Estimated Blood Loss:     Estimated blood loss was minimal. Procedure:                Pre-Anesthesia Assessment:                           - Prior to the procedure, a History and Physical                            was performed, and patient medications and                            allergies were reviewed. The patient's tolerance of                            previous anesthesia was also reviewed. The risks                            and benefits of the procedure and the sedation                            options and risks were discussed with the patient.                            All questions were answered, and informed consent                            was obtained. Prior Anticoagulants: The patient has                            taken no previous anticoagulant or antiplatelet                            agents. ASA Grade Assessment: II - A patient with                            mild systemic disease. After reviewing the risks  and benefits, the patient was deemed in                            satisfactory condition to undergo the procedure.                           After obtaining informed consent, the endoscope was                            passed under direct vision. Throughout the                            procedure, the patient's blood pressure, pulse,  and                            oxygen saturations were monitored continuously. The                            GIF-H190 ([id number]) scope was introduced through the                            mouth, and advanced to the second part of duodenum.                            The upper GI endoscopy was accomplished without                            difficulty. The patient tolerated the procedure                            well. Scope In: 9:27:51 AM Scope Out: 9:31:13 AM Total Procedure Duration: 0 hours 3 minutes 22 seconds  Findings:      Schatzki's ring found. Overlying erosions within 5 mm of the GE       junction. No Barrett's epithelium seen.      A medium-sized hiatal hernia was present. Scattered antral erosions; no       ulcer or infiltrating process seen. Patent pylorus. This was biopsied       with a cold forceps for histology. Estimated blood loss was minimal.      The duodenal bulb and second portion of the duodenum were normal. Impression:               -Mild erosive reflux esophagitis.                           - Medium-sized hiatal hernia. Biopsied.                           - Normal duodenal bulb and second portion of the                            duodenum. Moderate Sedation:      Moderate (conscious) sedation was administered by the endoscopy nurse       and supervised by the endoscopist. The following parameters were       monitored:  oxygen saturation, heart rate, blood pressure, respiratory       rate, EKG, adequacy of pulmonary ventilation, and response to care.       Total physician intraservice time was 30 minutes. Recommendation:           - Patient has a contact number available for                            emergencies. The signs and symptoms of potential                            delayed complications were discussed with the                            patient. Return to normal activities tomorrow.                            Written discharge instructions were  provided to the                            patient.                           - Advance diet as tolerated.                           - Continue present medications. See colonoscopy                            report. I doubt iron deficiency anemia secondary to                            any GI pathology (pending review of gastric                            biopsies to assess for H. pylori)                           - Await pathology results. May benefit from                            hematology evaluation. Begin Protonix 40 mg daily. Procedure Code(s):        --- Professional ---                           [phone], Esophagogastroduodenoscopy, flexible,                            transoral; with biopsy, single or multiple                           99153, Moderate sedation; each additional 15                            minutes intraservice time  G0500, Moderate sedation services provided by the                            same physician or other qualified health care                            professional performing a gastrointestinal                            endoscopic service that sedation supports,                            requiring the presence of an independent trained                            observer to assist in the monitoring of the                            patient's level of consciousness and physiological                            status; initial 15 minutes of intra-service time;                            patient age 17 years or older (additional time may                            be reported with 94765, as appropriate) Diagnosis Code(s):        --- Professional ---                           K20.90, Esophagitis, unspecified without bleeding                           K44.9, Diaphragmatic hernia without obstruction or                            gangrene                           D50.9, Iron deficiency anemia, unspecified CPT copyright 2019 American  Medical Association. All rights reserved. The codes documented in this report are preliminary and upon coder review may  be revised to meet current compliance requirements. Gerrit Friends. Aireonna Bauer, MD Gennette Pac, MD 03/30/2020 9:47:12 AM This report has been signed electronically. Number of Addenda: 0

## 2020-03-30 NOTE — Op Note (Signed)
[facility] Patient Name: Martha [name] Procedure Date: 03/30/2020 8:53 AM MRN: [id number] Date of Birth: 02/16/1981 Attending MD: Gennette Pac , MD CSN: [id number] Age: 39 Admit Type: Outpatient Procedure:                Colonoscopy Indications:              Unexplained iron deficiency anemia Providers:                Gennette Pac, MD, Jannett Celestine, RN, Sheran Fava, Pandora Leiter, Technician Referring MD:              Medicines:                Midazolam 9 mg IV, Meperidine 40 mg IV Complications:            No immediate complications. Estimated Blood Loss:     Estimated blood loss: none. Procedure:                Pre-Anesthesia Assessment:                           - Prior to the procedure, a History and Physical                            was performed, and patient medications and                            allergies were reviewed. The patient's tolerance of                            previous anesthesia was also reviewed. The risks                            and benefits of the procedure and the sedation                            options and risks were discussed with the patient.                            All questions were answered, and informed consent                            was obtained. Prior Anticoagulants: The patient has                            taken no previous anticoagulant or antiplatelet                            agents. ASA Grade Assessment: II - A patient with                            mild systemic disease. After reviewing the risks  and benefits, the patient was deemed in                            satisfactory condition to undergo the procedure.                           After obtaining informed consent, the colonoscope                            was passed under direct vision. Throughout the                            procedure, the patient's blood pressure, pulse, and                             oxygen saturations were monitored continuously. The                            CF-[id number] ([id number] scope was introduced through                            the anus and advanced to the 5 cm into the ileum.                            The colonoscopy was performed without difficulty.                            The patient tolerated the procedure well. The                            quality of the bowel preparation was adequate. The                            ileocecal valve, appendiceal orifice, and rectum                            were photographed. The entire colon was well                            visualized. Scope In: 9:07:48 AM Scope Out: 9:23:33 AM Scope Withdrawal Time: 0 hours 6 minutes 52 seconds  Total Procedure Duration: 0 hours 15 minutes 45 seconds  Findings:      The perianal and digital rectal examinations were normal.      The colon (entire examined portion) appeared normal. Rectal vault too       small to retroflex. Seen very well on?"face. Appeared normal. Distal 5 cm       of TI appeared normal. Impression:               - The entire examined colon is normal. Normal                            distal 5 cm of terminal ileum.                           -  No specimens collected. Moderate Sedation:      Moderate (conscious) sedation was administered by the endoscopy nurse       and supervised by the endoscopist. The following parameters were       monitored: oxygen saturation, heart rate, blood pressure, respiratory       rate, EKG, adequacy of pulmonary ventilation, and response to care.       Total physician intraservice time was 30 minutes. Recommendation:           - Patient has a contact number available for                            emergencies. The signs and symptoms of potential                            delayed complications were discussed with the                            patient. Return to normal activities tomorrow.                             Written discharge instructions were provided to the                            patient.                           - Resume previous diet.                           - Continue present medications.                           - Repeat colonoscopy in 10 years for screening                            purposes.                           - Return to GI office (date not yet determined).                            See EGD report. Procedure Code(s):        --- Professional ---                           [phone], Colonoscopy, flexible; diagnostic, including                            collection of specimen(s) by brushing or washing,                            when performed (separate procedure)                           99153, Moderate sedation; each additional 15  minutes intraservice time                           G0500, Moderate sedation services provided by the                            same physician or other qualified health care                            professional performing a gastrointestinal                            endoscopic service that sedation supports,                            requiring the presence of an independent trained                            observer to assist in the monitoring of the                            patient's level of consciousness and physiological                            status; initial 15 minutes of intra-service time;                            patient age 75 years or older (additional time may                            be reported with 85631, as appropriate) Diagnosis Code(s):        --- Professional ---                           D50.9, Iron deficiency anemia, unspecified CPT copyright 2019 American Medical Association. All rights reserved. The codes documented in this report are preliminary and upon coder review may  be revised to meet current compliance requirements. Gerrit Friends. Christino Mcglinchey, MD Gennette Pac,  MD 03/30/2020 9:39:15 AM This report has been signed electronically. Number of Addenda: 0

## 2020-03-30 NOTE — H&P (Signed)
@LOGO @   Primary Care Physician:  , NP Primary Gastroenterologist:  Dr. Freddy Finner  Pre-Procedure History & Physical: HPI:  Martha [name] is a 39 y.o. female here for further evaluation of iron deficiency anemia via colonoscopy and EGD.  History of dark stools.  Occult blood negative.  Iron deficiency confirmed.  Celiac screen negative. Heartburn 2-3 times weekly for which she takes Nexium as needed denies dysphagia.    Past Medical History:  Diagnosis Date  . Allergy   . Anemia    Phreesia 12/03/2019  . ANEMIA 08/09/2010   Qualifier: Diagnosis of  By: 08/11/2010 LPN, Brandi    . Breast feeding status of mother 07/07/2015  . Heartburn   . High cholesterol   . Multiparity 06/25/2015  . Postpartum care following vaginal delivery (12/31) 06/25/2015  . Status post tubal ligation at time of delivery, current hospitalization 06/27/2015  . Vomiting blood     Past Surgical History:  Procedure Laterality Date  . cyst removed from right wrist     X3  . ESOPHAGOGASTRODUODENOSCOPY  09/2005   Dr. 10/2005: very small hiatal hernia  . ESOPHAGOGASTRODUODENOSCOPY  01/2010   Dr. 02/2010: small hiatal hernia  . laser surgery on cervix     to remove polyp  . TUBAL LIGATION Bilateral 06/26/2015   Procedure: POST PARTUM TUBAL LIGATION;  Surgeon: 08/24/2015, MD;  Location: [facility];  Service: Gynecology;  Laterality: Bilateral;    Prior to Admission medications   Medication Sig Start Date End Date Taking? Authorizing Provider  acetaminophen (TYLENOL) 500 MG tablet Take 500-1,000 mg by mouth every 6 (six) hours as needed (pain.).   Yes [provider]  ferrous sulfate 325 (65 FE) MG EC tablet Take 325 mg by mouth daily.    Yes [provider]    Allergies as of 02/11/2020 - Review Complete 02/03/2020  Allergen Reaction Noted  . Penicillins Nausea And Vomiting 08/23/2010    Family History  Problem Relation Age of Onset  . Asthma Mother   . Allergies Mother   .  Cancer Maternal Aunt        facial  . Cancer Maternal Grandmother   . Colon cancer Neg Hx   . Inflammatory bowel disease Neg Hx   . Celiac disease Neg Hx     Social History   Socioeconomic History  . Marital status: Married    Spouse name: 08/25/2010   . Number of children: 2  . Years of education: Not on file  . Highest education level: Bachelor's degree (e.g., BA, AB, BS)  Occupational History  . Occupation: [name]    Comment: special education  Tobacco Use  . Smoking status: Never Smoker  . Smokeless tobacco: Never Used  Substance and Sexual Activity  . Alcohol use: Yes    Comment: socially  . Drug use: No  . Sexual activity: Yes    Birth control/protection: Surgical  Other Topics Concern  . 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 Difficulty of Paying Living Expenses: Not hard at all  Food Insecurity: No Food Insecurity  . Worried About Programme researcher, broadcasting/film/video in the Last Year: Never true  . Ran Out of Food in the Last Year: Never true  Transportation Needs: No Transportation Needs  . Lack of Transportation (Medical): No  . Lack of Transportation (Non-Medical): No  Physical Activity: Insufficiently Active  . Days of Exercise per Week: 2 days  . Minutes of Exercise per Session: 30 min  Stress: No Stress Concern Present  . Feeling of Stress : Only a little  Social Connections: Moderately Integrated  . Frequency of Communication with Friends and Family: More than three times a week  . Frequency of Social Gatherings with Friends and Family: Once a week  . Attends Religious  Services: More than 4 times per year  . Active Member of Clubs or Organizations: No  . Attends Banker Meetings: Never  . Marital Status: Married  Catering manager Violence: Not At Risk  . Fear of Current or Ex-Partner: No  . Emotionally Abused: No  . Physically Abused: No  . Sexually Abused: No    Review of Systems: See HPI, otherwise negative ROS  Physical Exam: BP 117/74   Pulse 82   Temp 98.4 F (36.9 C) (Oral)   Resp 10   Ht 5' 6.5" (1.689 m)   Wt 90.7 kg   LMP 03/19/2020 (Approximate)   SpO2 99%   BMI 31.80 kg/m  General:   Alert,  Well-developed, well-nourished, pleasant and cooperative in NAD SMouth:  No deformity or lesions. Neck:  Supple; no masses or thyromegaly. No significant cervical adenopathy. Lungs:  Clear throughout to auscultation.   No wheezes, crackles, or rhonchi. No acute distress. Heart:  Regular rate and rhythm; no murmurs, clicks, rubs,  or gallops. Abdomen: Non-distended, normal bowel sounds.  Soft and nontender without appreciable mass or hepatosplenomegaly.    Impression: 39 year old lady with iron deficiency anemia.  Hemoccult negative.  Chronic heartburn.  No alarm symptoms.  Per plan, I will offer this lady a diagnostic colonoscopy followed by possible EGD.  I explained to patient if we do not find anything above or below she may be best served by seeing the hematologist in the near future. The risks, benefits, limitations, imponderables and alternatives regarding both EGD and colonoscopy have been reviewed with the patient. Questions have been answered. All parties agreeable.     Notice: This dictation was prepared with Dragon dictation along with smaller phrase technology. Any transcriptional errors that result from this process are unintentional and may not be corrected upon review.

## 2020-03-30 NOTE — Discharge Instructions (Signed)
Colonoscopy Discharge Instructions  Read the instructions outlined below and refer to this sheet in the next few weeks. These discharge instructions provide you with general information on caring for yourself after you leave the hospital. Your doctor may also give you specific instructions. While your treatment has been planned according to the most current medical practices available, unavoidable complications occasionally occur. If you have any problems or questions after discharge, call Dr. Gala Romney at [phone]. ACTIVITY  You may resume your regular activity, but move at a slower pace for the next 24 hours.   Take frequent rest periods for the next 24 hours.   Walking will help get rid of the air and reduce the bloated feeling in your belly (abdomen).   No driving for 24 hours (because of the medicine (anesthesia) used during the test).    Do not sign any important legal documents or operate any machinery for 24 hours (because of the anesthesia used during the test).  NUTRITION  Drink plenty of fluids.   You may resume your normal diet as instructed by your doctor.   Begin with a light meal and progress to your normal diet. Heavy or fried foods are harder to digest and may make you feel sick to your stomach (nauseated).   Avoid alcoholic beverages for 24 hours or as instructed.  MEDICATIONS  You may resume your normal medications unless your doctor tells you otherwise.  WHAT YOU CAN EXPECT TODAY  Some feelings of bloating in the abdomen.   Passage of more gas than usual.   Spotting of blood in your stool or on the toilet paper.  IF YOU HAD POLYPS REMOVED DURING THE COLONOSCOPY:  No aspirin products for 7 days or as instructed.   No alcohol for 7 days or as instructed.   Eat a soft diet for the next 24 hours.  FINDING OUT THE RESULTS OF YOUR TEST Not all test results are available during your visit. If your test results are not back during the visit, make an appointment  with your caregiver to find out the results. Do not assume everything is normal if you have not heard from your caregiver or the medical facility. It is important for you to follow up on all of your test results.  SEEK IMMEDIATE MEDICAL ATTENTION IF:  You have more than a spotting of blood in your stool.   Your belly is swollen (abdominal distention).   You are nauseated or vomiting.   You have a temperature over 101.   You have abdominal pain or discomfort that is severe or gets worse throughout the day.   EGD Discharge instructions Please read the instructions outlined below and refer to this sheet in the next few weeks. These discharge instructions provide you with general information on caring for yourself after you leave the hospital. Your doctor may also give you specific instructions. While your treatment has been planned according to the most current medical practices available, unavoidable complications occasionally occur. If you have any problems or questions after discharge, please call your doctor. ACTIVITY  You may resume your regular activity but move at a slower pace for the next 24 hours.   Take frequent rest periods for the next 24 hours.   Walking will help expel (get rid of) the air and reduce the bloated feeling in your abdomen.   No driving for 24 hours (because of the anesthesia (medicine) used during the test).   You may shower.   Do not sign any  important legal documents or operate any machinery for 24 hours (because of the anesthesia used during the test).  NUTRITION  Drink plenty of fluids.   You may resume your normal diet.   Begin with a light meal and progress to your normal diet.   Avoid alcoholic beverages for 24 hours or as instructed by your caregiver.  MEDICATIONS  You may resume your normal medications unless your caregiver tells you otherwise.  WHAT YOU CAN EXPECT TODAY  You may experience abdominal discomfort such as a feeling of  fullness or "gas" pains.  FOLLOW-UP  Your doctor will discuss the results of your test with you.  SEEK IMMEDIATE MEDICAL ATTENTION IF ANY OF THE FOLLOWING OCCUR:  Excessive nausea (feeling sick to your stomach) and/or vomiting.   Severe abdominal pain and distention (swelling).   Trouble swallowing.   Temperature over 101 F (37.8 C).   Rectal bleeding or vomiting of blood.    GERD information provided.  Your colon was completely normal today and your stomach was biopsied.  You had acid irritation in your stomach and esophagus  Begin Protonix 40 mg daily to control the acid.  Office visit with [place] in 3 months  Further recommendations to follow pending review of pathology report  At patient request, I spoke to [facility] at [phone] -reviewed results.  PATIENT INSTRUCTIONS POST-ANESTHESIA  IMMEDIATELY FOLLOWING SURGERY:  Do not drive or operate machinery for the first twenty four hours after surgery.  Do not make any important decisions for twenty four hours after surgery or while taking narcotic pain medications or sedatives.  If you develop intractable nausea and vomiting or a severe headache please notify your doctor immediately.  FOLLOW-UP:  Please make an appointment with your surgeon as instructed. You do not need to follow up with anesthesia unless specifically instructed to do so.  WOUND CARE INSTRUCTIONS (if applicable):  Keep a dry clean dressing on the anesthesia/puncture wound site if there is drainage.  Once the wound has quit draining you may leave it open to air.  Generally you should leave the bandage intact for twenty four hours unless there is drainage.  If the epidural site drains for more than 36-48 hours please call the anesthesia department.  QUESTIONS?:  Please feel free to call your physician or the hospital operator if you have any questions, and they will be happy to assist you.       Gastroesophageal Reflux Disease, Adult Gastroesophageal reflux (GER)  happens when acid from the stomach flows up into the tube that connects the mouth and the stomach (esophagus). Normally, food travels down the esophagus and stays in the stomach to be digested. With GER, food and stomach acid sometimes move back up into the esophagus. You may have a disease called gastroesophageal reflux disease (GERD) if the reflux:  Happens often.  Causes frequent or very bad symptoms.  Causes problems such as damage to the esophagus. When this happens, the esophagus becomes sore and swollen (inflamed). Over time, GERD can make small holes (ulcers) in the lining of the esophagus. What are the causes? This condition is caused by a problem with the muscle between the esophagus and the stomach. When this muscle is weak or not normal, it does not close properly to keep food and acid from coming back up from the stomach. The muscle can be weak because of:  Tobacco use.  Pregnancy.  Having a certain type of hernia (hiatal hernia).  Alcohol use.  Certain foods and drinks,  such as coffee, chocolate, onions, and peppermint. What increases the risk? You are more likely to develop this condition if you:  Are overweight.  Have a disease that affects your connective tissue.  Use NSAID medicines. What are the signs or symptoms? Symptoms of this condition include:  Heartburn.  Difficult or painful swallowing.  The feeling of having a lump in the throat.  A bitter taste in the mouth.  Bad breath.  Having a lot of saliva.  Having an upset or bloated stomach.  Belching.  Chest pain. Different conditions can cause chest pain. Make sure you see your doctor if you have chest pain.  Shortness of breath or noisy breathing (wheezing).  Ongoing (chronic) cough or a cough at night.  Wearing away of the surface of teeth (tooth enamel).  Weight loss. How is this treated? Treatment will depend on how bad your symptoms are. Your doctor may suggest:  Changes to your  diet.  Medicine.  Surgery. Follow these instructions at home: Eating and drinking   Follow a diet as told by your doctor. You may need to avoid foods and drinks such as: ? Coffee and tea (with or without caffeine). ? Drinks that contain alcohol. ? Energy drinks and sports drinks. ? Bubbly (carbonated) drinks or sodas. ? Chocolate and cocoa. ? Peppermint and mint flavorings. ? Garlic and onions. ? Horseradish. ? Spicy and acidic foods. These include peppers, chili powder, curry powder, vinegar, hot sauces, and BBQ sauce. ? Citrus fruit juices and citrus fruits, such as oranges, lemons, and limes. ? Tomato-based foods. These include red sauce, chili, salsa, and pizza with red sauce. ? Fried and fatty foods. These include donuts, french fries, potato chips, and high-fat dressings. ? High-fat meats. These include hot dogs, rib eye steak, sausage, ham, and bacon. ? High-fat dairy items, such as whole milk, butter, and cream cheese.  Eat small meals often. Avoid eating large meals.  Avoid drinking large amounts of liquid with your meals.  Avoid eating meals during the 2-3 hours before bedtime.  Avoid lying down right after you eat.  Do not exercise right after you eat. Lifestyle   Do not use any products that contain nicotine or tobacco. These include cigarettes, e-cigarettes, and chewing tobacco. If you need help quitting, ask your doctor.  Try to lower your stress. If you need help doing this, ask your doctor.  If you are overweight, lose an amount of weight that is healthy for you. Ask your doctor about a safe weight loss goal. General instructions  Pay attention to any changes in your symptoms.  Take over-the-counter and prescription medicines only as told by your doctor. Do not take aspirin, ibuprofen, or other NSAIDs unless your doctor says it is okay.  Wear loose clothes. Do not wear anything tight around your waist.  Raise (elevate) the head of your bed about 6  inches (15 cm).  Avoid bending over if this makes your symptoms worse.  Keep all follow-up visits as told by your doctor. This is important. Contact a doctor if:  You have new symptoms.  You lose weight and you do not know why.  You have trouble swallowing or it hurts to swallow.  You have wheezing or a cough that keeps happening.  Your symptoms do not get better with treatment.  You have a hoarse voice. Get help right away if:  You have pain in your arms, neck, jaw, teeth, or back.  You feel sweaty, dizzy, or light-headed.  You  have chest pain or shortness of breath.  You throw up (vomit) and your throw-up looks like blood or coffee grounds.  You pass out (faint).  Your poop (stool) is bloody or black.  You cannot swallow, drink, or eat. Summary  If a person has gastroesophageal reflux disease (GERD), food and stomach acid move back up into the esophagus and cause symptoms or problems such as damage to the esophagus.  Treatment will depend on how bad your symptoms are.  Follow a diet as told by your doctor.  Take all medicines only as told by your doctor. This information is not intended to replace advice given to you by your health care provider. Make sure you discuss any questions you have with your health care provider. Document Revised: 12/18/2017 Document Reviewed: 12/18/2017 Elsevier Patient Education  2020 [employer].

## 2020-03-31 ENCOUNTER — Other Ambulatory Visit: Payer: Self-pay

## 2020-03-31 LAB — SURGICAL PATHOLOGY

## 2020-04-01 ENCOUNTER — Encounter: Payer: Self-pay | Admitting: Internal Medicine

## 2020-04-05 ENCOUNTER — Encounter (HOSPITAL_COMMUNITY): Payer: Self-pay | Admitting: Internal Medicine

## 2020-04-12 ENCOUNTER — Encounter: Payer: Self-pay | Admitting: Family Medicine

## 2020-04-12 ENCOUNTER — Other Ambulatory Visit: Payer: Self-pay | Admitting: *Deleted

## 2020-04-12 MED ORDER — FERROUS SULFATE 325 (65 FE) MG PO TBEC
325.0000 mg | DELAYED_RELEASE_TABLET | Freq: Every day | ORAL | 0 refills | Status: DC
Start: 2020-04-12 — End: 2020-09-06

## 2020-05-18 ENCOUNTER — Other Ambulatory Visit: Payer: Self-pay

## 2020-05-18 DIAGNOSIS — D649 Anemia, unspecified: Secondary | ICD-10-CM

## 2020-05-23 ENCOUNTER — Telehealth: Payer: BC Managed Care – PPO | Admitting: Nurse Practitioner

## 2020-05-23 ENCOUNTER — Encounter: Payer: Self-pay | Admitting: Nurse Practitioner

## 2020-05-23 ENCOUNTER — Other Ambulatory Visit: Payer: Self-pay

## 2020-05-23 VITALS — Ht 66.5 in | Wt 200.0 lb

## 2020-05-23 DIAGNOSIS — J22 Unspecified acute lower respiratory infection: Secondary | ICD-10-CM

## 2020-05-23 MED ORDER — ALBUTEROL SULFATE HFA 108 (90 BASE) MCG/ACT IN AERS: 2.0000 | INHALATION_SPRAY | Freq: Four times a day (QID) | RESPIRATORY_TRACT | 0 refills | Status: DC | PRN

## 2020-05-23 MED ORDER — MUCINEX DM MAXIMUM STRENGTH 60-1200 MG PO TB12: 1.0000 | ORAL_TABLET | Freq: Two times a day (BID) | ORAL | 0 refills | Status: DC | PRN

## 2020-05-23 MED ORDER — AZITHROMYCIN 250 MG PO TABS: ORAL_TABLET | ORAL | 0 refills | Status: DC

## 2020-05-23 NOTE — Patient Instructions (Signed)
-  Drink plenty of water while using mucinex to help thin secretions

## 2020-05-23 NOTE — Assessment & Plan Note (Signed)
-  Rx. Albuterol for SOB -Rx. Azithromycin, pt allergic to [facility] -Rx. Mucinex-DM for congestion

## 2020-05-23 NOTE — Progress Notes (Signed)
Acute Office Visit  Subjective:    Patient ID: Martha [name], female    DOB: 02-22-1981, 39 y.o.   MRN: [id number]  Chief Complaint  Patient presents with  . Cough    x1 week, productive  . Hoarse    x1 week    HPI Patient is in today for productive cough, SOB, congestion, and fullness in chest.  She states her cough is worse at night when she is laying flat.  Started a week ago.  It was originally in her nose, but no longer having rhinorrhea.  Denies fever, anosmia, sinus pressure.  Past Medical History:  Diagnosis Date  . Allergy   . Anemia    Phreesia 12/03/2019  . ANEMIA 08/09/2010   Qualifier: Diagnosis of  By: Lillia Mountain LPN, Brandi    . Breast feeding status of mother 07/07/2015  . Heartburn   . High cholesterol   . Multiparity 06/25/2015  . Postpartum care following vaginal delivery (12/31) 06/25/2015  . Status post tubal ligation at time of delivery, current hospitalization 06/27/2015  . Vomiting blood     Past Surgical History:  Procedure Laterality Date  . BIOPSY  03/30/2020   Procedure: BIOPSY;  Surgeon: Corbin Ade, MD;  Location: [facility];  Service: Endoscopy;;  . CHOLECYSTECTOMY N/A    Phreesia 05/23/2020  . COLONOSCOPY N/A 03/30/2020   Procedure: COLONOSCOPY;  Surgeon: Corbin Ade, MD;  Location: [facility];  Service: Endoscopy;  Laterality: N/A;  9:00am  . cyst removed from right wrist     X3  . ESOPHAGOGASTRODUODENOSCOPY  09/2005   Dr. Jena Gauss: very small hiatal hernia  . ESOPHAGOGASTRODUODENOSCOPY  01/2010   Dr. Jena Gauss: small hiatal hernia  . ESOPHAGOGASTRODUODENOSCOPY N/A 03/30/2020   Procedure: ESOPHAGOGASTRODUODENOSCOPY (EGD);  Surgeon: Corbin Ade, MD;  [facility]: [facility];  Service: Endoscopy;  Laterality: N/A;  . laser surgery on cervix     to remove polyp  . TUBAL LIGATION Bilateral 06/26/2015   Procedure: POST PARTUM TUBAL LIGATION;  Surgeon: Maxie Better, MD;  Location: [facility];  Service: Gynecology;  Laterality:  Bilateral;    Family History  Problem Relation Age of Onset  . Asthma Mother   . Allergies Mother   . Cancer Maternal Aunt        facial  . Cancer Maternal Grandmother   . Colon cancer Neg Hx   . Inflammatory bowel disease Neg Hx   . Celiac disease Neg Hx     Social History   Socioeconomic History  . Marital status: Married    Spouse name: [name]   . Number of children: 2  . Years of education: Not on file  . Highest education level: Bachelor's degree (e.g., BA, AB, BS)  Occupational History  . Occupation: Runner, broadcasting/film/video    Comment: special education  Tobacco Use  . Smoking status: Never Smoker  . Smokeless tobacco: Never Used  Substance and Sexual Activity  . Alcohol use: Yes    Comment: socially  . Drug use: No  . Sexual activity: Yes    Birth control/protection: Surgical  Other Topics Concern  . Not on file  Social History Narrative   Lives with Hunter-husband 10 years and children: Aspen 6 and Rhyland 4      Enjoys: cooking, teaching      Diet: eats all food groups- no pork   Caffeine: 1 cup of coffee, tea daily   Water: 4-5 daily  Wears seat belt   Does not use phone while driving   Smoke detectors at home   [employer]             Social Determinants of Health   Financial Resource Strain: Low Risk   . Difficulty of Paying Living Expenses: Not hard at all  Food Insecurity: No Food Insecurity  . Worried About Programme researcher, broadcasting/film/video in the Last Year: Never true  . Ran Out of Food in the Last Year: Never true  Transportation Needs: No Transportation Needs  . Lack of Transportation (Medical): No  . Lack of Transportation (Non-Medical): No  Physical Activity: Insufficiently Active  . Days of Exercise per Week: 2 days  . Minutes of Exercise per Session: 30 min  Stress: No Stress Concern Present  . Feeling of Stress : Only a little  Social Connections: Moderately Integrated  . Frequency of Communication with Friends and Family: More than three  times a week  . Frequency of Social Gatherings with Friends and Family: Once a week  . Attends Religious Services: More than 4 times per year  . Active Member of Clubs or Organizations: No  . Attends Banker Meetings: Never  . Marital Status: Married  Catering manager Violence: Not At Risk  . Fear of Current or Ex-Partner: No  . Emotionally Abused: No  . Physically Abused: No  . Sexually Abused: No    Outpatient Medications Prior to Visit  Medication Sig Dispense Refill  . acetaminophen (TYLENOL) 500 MG tablet Take 500-1,000 mg by mouth every 6 (six) hours as needed (pain.).    Marland Kitchen ferrous sulfate 325 (65 FE) MG EC tablet Take 1 tablet (325 mg total) by mouth daily. 30 tablet 0   No facility-administered medications prior to visit.    Allergies  Allergen Reactions  . Penicillins Diarrhea and Nausea And Vomiting    Has patient had a PCN reaction causing immediate rash, facial/tongue/throat swelling, SOB or lightheadedness with hypotension: No Has patient had a PCN reaction causing severe rash involving mucus membranes or skin necrosis: No Has patient had a PCN reaction that required hospitalization No Has patient had a PCN reaction occurring within the last 10 years: Yes If all of the above answers are "NO", then may proceed with Cephalosporin use.     Review of Systems  HENT: Positive for congestion. Negative for sinus pressure and sore throat.   Respiratory: Positive for cough and shortness of breath.   Cardiovascular: Negative.        Objective:    Physical Exam  Ht 5' 6.5" (1.689 m)   Wt 200 lb (90.7 kg)   BMI 31.80 kg/m  Wt Readings from Last 3 Encounters:  05/23/20 200 lb (90.7 kg)  03/30/20 200 lb (90.7 kg)  03/16/20 204 lb (92.5 kg)    Health Maintenance Due  Topic Date Due  . COVID-19 Vaccine (1) Never done  . PAP SMEAR-Modifier  09/11/2015    There are no preventive care reminders to display for this patient.   Lab Results  Component  Value Date   TSH 1.670 01/29/2020   Lab Results  Component Value Date   WBC 5.9 02/26/2020   HGB 11.1 (L) 02/26/2020   HCT 35.0 02/26/2020   MCV 81.4 02/26/2020   PLT 235 02/26/2020   Lab Results  Component Value Date   NA 139 02/26/2020   K 4.3 02/26/2020   CO2 26 02/26/2020   GLUCOSE 77 02/26/2020   BUN  11 02/26/2020   CREATININE 0.90 02/26/2020   BILITOT 0.4 02/26/2020   AST 17 02/26/2020   ALT 14 02/26/2020   PROT 7.2 02/26/2020   CALCIUM 9.4 02/26/2020   Lab Results  Component Value Date   CHOL 173 10/21/2012   Lab Results  Component Value Date   HDL 48 10/21/2012   Lab Results  Component Value Date   LDLCALC 111 (H) 10/21/2012   Lab Results  Component Value Date   TRIG 68 10/21/2012   Lab Results  Component Value Date   CHOLHDL 3.6 10/21/2012   Lab Results  Component Value Date   HGBA1C 5.5 01/29/2020       Assessment & Plan:   Problem List Items Addressed This Visit      Respiratory   Lower respiratory tract infection - Primary    -Rx. Albuterol for SOB -Rx. Azithromycin, pt allergic to [facility] -Rx. Mucinex-DM for congestion      Relevant Medications   azithromycin (ZITHROMAX) 250 MG tablet       Meds ordered this encounter  Medications  . azithromycin (ZITHROMAX) 250 MG tablet    Sig: Please dispense as a z-pack    Dispense:  6 tablet    Refill:  0  . Dextromethorphan-guaiFENesin (MUCINEX DM MAXIMUM STRENGTH) 60-1200 MG TB12    Sig: Take 1 tablet by mouth every 12 (twelve) hours as needed (congestion).    Dispense:  28 tablet    Refill:  0  . albuterol (VENTOLIN HFA) 108 (90 Base) MCG/ACT inhaler    Sig: Inhale 2 puffs into the lungs every 6 (six) hours as needed for wheezing or shortness of breath.    Dispense:  8 g    Refill:  0   Date:  05/23/2020   Location of Patient: Home Location of Provider: Office Consent was obtain for visit to be over via telehealth. I verified that I am speaking with the correct person using two  identifiers.  I connected with  Sandee Shawnie Dapper on 05/23/20 via telephone and verified that I am speaking with the correct person using two identifiers.   I discussed the limitations of evaluation and management by telemedicine. The patient expressed understanding and agreed to proceed.  Time Spent 11 minutes  Heather Roberts, NP

## 2020-05-24 ENCOUNTER — Telehealth: Payer: BC Managed Care – PPO | Admitting: Nurse Practitioner

## 2020-06-11 LAB — CBC WITH DIFFERENTIAL/PLATELET
Absolute Monocytes: 689 cells/uL (ref 200–950)
Basophils Absolute: 16 cells/uL (ref 0–200)
Basophils Relative: 0.2 %
Eosinophils Absolute: 57 cells/uL (ref 15–500)
Eosinophils Relative: 0.7 %
HCT: 34.8 % — ABNORMAL LOW (ref 35.0–45.0)
Hemoglobin: 11 g/dL — ABNORMAL LOW (ref 11.7–15.5)
Lymphs Abs: 1984 cells/uL (ref 850–3900)
MCH: 25.3 pg — ABNORMAL LOW (ref 27.0–33.0)
MCHC: 31.6 g/dL — ABNORMAL LOW (ref 32.0–36.0)
MCV: 80 fL (ref 80.0–100.0)
MPV: 11.9 fL (ref 7.5–12.5)
Monocytes Relative: 8.4 %
Neutro Abs: 5453 cells/uL (ref 1500–7800)
Neutrophils Relative %: 66.5 %
Platelets: 242 10*3/uL (ref 140–400)
RBC: 4.35 10*6/uL (ref 3.80–5.10)
RDW: 13.5 % (ref 11.0–15.0)
Total Lymphocyte: 24.2 %
WBC: 8.2 10*3/uL (ref 3.8–10.8)

## 2020-06-11 LAB — FERRITIN: Ferritin: 25 ng/mL (ref 16–154)

## 2020-06-22 ENCOUNTER — Other Ambulatory Visit: Payer: Self-pay | Admitting: *Deleted

## 2020-06-22 DIAGNOSIS — D509 Iron deficiency anemia, unspecified: Secondary | ICD-10-CM

## 2020-06-30 ENCOUNTER — Other Ambulatory Visit: Payer: Self-pay

## 2020-06-30 DIAGNOSIS — Z20822 Contact with and (suspected) exposure to covid-19: Secondary | ICD-10-CM

## 2020-07-01 ENCOUNTER — Other Ambulatory Visit: Payer: Self-pay

## 2020-07-01 ENCOUNTER — Encounter: Payer: Self-pay | Admitting: Family Medicine

## 2020-07-01 ENCOUNTER — Telehealth (INDEPENDENT_AMBULATORY_CARE_PROVIDER_SITE_OTHER): Payer: Self-pay | Admitting: Nurse Practitioner

## 2020-07-01 ENCOUNTER — Encounter: Payer: Self-pay | Admitting: Nurse Practitioner

## 2020-07-01 DIAGNOSIS — J069 Acute upper respiratory infection, unspecified: Secondary | ICD-10-CM

## 2020-07-01 MED ORDER — NOREL AD 4-10-325 MG PO TABS: 1.0000 | ORAL_TABLET | ORAL | 1 refills | Status: DC | PRN

## 2020-07-01 MED ORDER — BENZONATATE 100 MG PO CAPS: 100.0000 mg | ORAL_CAPSULE | Freq: Two times a day (BID) | ORAL | 0 refills | Status: DC | PRN

## 2020-07-01 NOTE — Progress Notes (Signed)
Acute Office Visit  Subjective:    Patient ID: Martha [name], female    DOB: 07-Dec-1980, 40 y.o.   MRN: [id number]  Chief Complaint  Patient presents with  . Cough    X 3 days   . Fever    X 3 days   . Chills    X 3 days   . Generalized Body Aches    X 3 days   . Headache    X 2 days      HPI Patient is in today for sick visit.  She has been experiencing symptoms x3 days.  Had COVID test at a clinic yesterday, but she hasn't had results yet.  She has tried tylenol for her low-grade fever of around 100.0.  Past Medical History:  Diagnosis Date  . Allergy   . Anemia    Phreesia 12/03/2019  . ANEMIA 08/09/2010   Qualifier: Diagnosis of  By: Lillia Mountain LPN, Brandi    . Breast feeding status of mother 07/07/2015  . Heartburn   . High cholesterol   . Multiparity 06/25/2015  . Postpartum care following vaginal delivery (12/31) 06/25/2015  . Status post tubal ligation at time of delivery, current hospitalization 06/27/2015  . Vomiting blood     Past Surgical History:  Procedure Laterality Date  . BIOPSY  03/30/2020   Procedure: BIOPSY;  Surgeon: Corbin Ade, MD;  [facility];  Service: Endoscopy;;  . CHOLECYSTECTOMY N/A    Phreesia 05/23/2020  . COLONOSCOPY N/A 03/30/2020   Procedure: COLONOSCOPY;  Surgeon: Corbin Ade, MD;  [facility];  Service: Endoscopy;  Laterality: N/A;  9:00am  . cyst removed from right wrist     X3  . ESOPHAGOGASTRODUODENOSCOPY  09/2005   Dr. Jena Gauss: very small hiatal hernia  . ESOPHAGOGASTRODUODENOSCOPY  01/2010   Dr. Jena Gauss: small hiatal hernia  . ESOPHAGOGASTRODUODENOSCOPY N/A 03/30/2020   Procedure: ESOPHAGOGASTRODUODENOSCOPY (EGD);  Surgeon: Corbin Ade, MD;  [facility]: [facility];  Service: Endoscopy;  Laterality: N/A;  . laser surgery on cervix     to remove polyp  . TUBAL LIGATION Bilateral 06/26/2015   Procedure: POST PARTUM TUBAL LIGATION;  Surgeon: Maxie Better, MD;  Location: [facility];  Service:  Gynecology;  Laterality: Bilateral;    Family History  Problem Relation Age of Onset  . Asthma Mother   . Allergies Mother   . Cancer Maternal Aunt        facial  . Cancer Maternal Grandmother   . Colon cancer Neg Hx   . Inflammatory bowel disease Neg Hx   . Celiac disease Neg Hx     Social History   Socioeconomic History  . Marital status: Married    Spouse name: [name]   . Number of children: 2  . Years of education: Not on file  . Highest education level: Bachelor's degree (e.g., BA, AB, BS)  Occupational History  . Occupation: Runner, broadcasting/film/video    Comment: special education  Tobacco Use  . Smoking status: Never Smoker  . Smokeless tobacco: Never Used  Substance and Sexual Activity  . Alcohol use: Yes    Comment: socially  . Drug use: No  . Sexual activity: Yes    Birth control/protection: Surgical  Other Topics Concern  . Not on file  Social History Narrative   Lives with Hunter-husband 10 years and children: Aspen 6 and Rhyland 4      Enjoys: cooking, teaching      Diet: eats all  food groups- no pork   Caffeine: 1 cup of coffee, tea daily   Water: 4-5 daily         Wears seat belt   Does not use phone while driving   Psychologist, sport and exercise at home   [employer]             Social Determinants of Health   Financial Resource Strain: Low Risk   . Difficulty of Paying Living Expenses: Not hard at all  Food Insecurity: No Food Insecurity  . Worried About Programme researcher, broadcasting/film/video in the Last Year: Never true  . Ran Out of Food in the Last Year: Never true  Transportation Needs: No Transportation Needs  . Lack of Transportation (Medical): No  . Lack of Transportation (Non-Medical): No  Physical Activity: Insufficiently Active  . Days of Exercise per Week: 2 days  . Minutes of Exercise per Session: 30 min  Stress: No Stress Concern Present  . Feeling of Stress : Only a little  Social Connections: Moderately Integrated  . Frequency of Communication with Friends and  Family: More than three times a week  . Frequency of Social Gatherings with Friends and Family: Once a week  . Attends Religious Services: More than 4 times per year  . Active Member of Clubs or Organizations: No  . Attends Banker Meetings: Never  . Marital Status: Married  Catering manager Violence: Not At Risk  . Fear of Current or Ex-Partner: No  . Emotionally Abused: No  . Physically Abused: No  . Sexually Abused: No    Outpatient Medications Prior to Visit  Medication Sig Dispense Refill  . acetaminophen (TYLENOL) 500 MG tablet Take 500-1,000 mg by mouth every 6 (six) hours as needed (pain.).    Marland Kitchen albuterol (VENTOLIN HFA) 108 (90 Base) MCG/ACT inhaler Inhale 2 puffs into the lungs every 6 (six) hours as needed for wheezing or shortness of breath. 8 g 0  . Dextromethorphan-guaiFENesin (MUCINEX DM MAXIMUM STRENGTH) 60-1200 MG TB12 Take 1 tablet by mouth every 12 (twelve) hours as needed (congestion). 28 tablet 0  . ferrous sulfate 325 (65 FE) MG EC tablet Take 1 tablet (325 mg total) by mouth daily. 30 tablet 0  . azithromycin (ZITHROMAX) 250 MG tablet Please dispense as a z-pack (Patient not taking: Reported on 07/01/2020) 6 tablet 0   No facility-administered medications prior to visit.    Allergies  Allergen Reactions  . Penicillins Diarrhea and Nausea And Vomiting    Has patient had a PCN reaction causing immediate rash, facial/tongue/throat swelling, SOB or lightheadedness with hypotension: No Has patient had a PCN reaction causing severe rash involving mucus membranes or skin necrosis: No Has patient had a PCN reaction that required hospitalization No Has patient had a PCN reaction occurring within the last 10 years: Yes If all of the above answers are "NO", then may proceed with Cephalosporin use.     Review of Systems  Constitutional: Positive for chills, fatigue and fever.  HENT: Positive for congestion and rhinorrhea. Negative for sinus pressure, sinus  pain and sore throat.   Respiratory: Positive for cough. Negative for chest tightness, shortness of breath and wheezing.   Cardiovascular: Negative.   Gastrointestinal: Negative for abdominal pain and diarrhea.       Objective:    Physical Exam  There were no vitals taken for this visit. Wt Readings from Last 3 Encounters:  05/23/20 200 lb (90.7 kg)  03/30/20 200 lb (90.7 kg)  03/16/20 204  lb (92.5 kg)    Health Maintenance Due  Topic Date Due  . COVID-19 Vaccine (1) Never done  . PAP SMEAR-Modifier  09/11/2015    There are no preventive care reminders to display for this patient.   Lab Results  Component Value Date   TSH 1.670 01/29/2020   Lab Results  Component Value Date   WBC 8.2 06/10/2020   HGB 11.0 (L) 06/10/2020   HCT 34.8 (L) 06/10/2020   MCV 80.0 06/10/2020   PLT 242 06/10/2020   Lab Results  Component Value Date   NA 139 02/26/2020   K 4.3 02/26/2020   CO2 26 02/26/2020   GLUCOSE 77 02/26/2020   BUN 11 02/26/2020   CREATININE 0.90 02/26/2020   BILITOT 0.4 02/26/2020   AST 17 02/26/2020   ALT 14 02/26/2020   PROT 7.2 02/26/2020   CALCIUM 9.4 02/26/2020   Lab Results  Component Value Date   CHOL 173 10/21/2012   Lab Results  Component Value Date   HDL 48 10/21/2012   Lab Results  Component Value Date   LDLCALC 111 (H) 10/21/2012   Lab Results  Component Value Date   TRIG 68 10/21/2012   Lab Results  Component Value Date   CHOLHDL 3.6 10/21/2012   Lab Results  Component Value Date   HGBA1C 5.5 01/29/2020       Assessment & Plan:   Problem List Items Addressed This Visit      Respiratory   URI (upper respiratory infection)    -had COVID test yesterday, but no results yet -works at [employer] and there were sick contacts there -Rx. norel -Rx. Tessalon for mild cough -will consider abx and/or prednisone around day #7 07/05/20 -discussed notifying us if she is covid positive and counseled about low availability of  MABS          Meds ordered this encounter  Medications  . benzonatate (TESSALON) 100 MG capsule    Sig: Take 1 capsule (100 mg total) by mouth 2 (two) times daily as needed for cough.    Dispense:  20 capsule    Refill:  0  . Chlorphen-PE-Acetaminophen (NOREL AD) 4-10-325 MG TABS    Sig: Take 1 tablet by mouth every 4 (four) hours as needed (nasal congestion, cold symptoms).    Dispense:  20 tablet    Refill:  1   Date:  07/01/2020   Location of Patient: Home Location of Provider: Office Consent was obtain for visit to be over via telehealth. I verified that I am speaking with the correct person using two identifiers.  I connected with  Megahn Adrienne Mocha on 07/01/20 via telephone and verified that I am speaking with the correct person using two identifiers.   I discussed the limitations of evaluation and management by telemedicine. The patient expressed understanding and agreed to proceed.  Time spent: 9 minutes   Noreene Larsson, NP

## 2020-07-01 NOTE — Telephone Encounter (Signed)
Pt made a phone appt today with gray 07-01-20

## 2020-07-01 NOTE — Assessment & Plan Note (Signed)
-  had COVID test yesterday, but no results yet -works at [employer] and there were sick contacts there -Rx. norel -Rx. Tessalon for mild cough -will consider abx and/or prednisone around day #7 07/05/20 -discussed notifying us if she is covid positive and counseled about low availability of MABS

## 2020-07-02 LAB — NOVEL CORONAVIRUS, NAA: SARS-CoV-2, NAA: DETECTED — AB

## 2020-07-02 LAB — SPECIMEN STATUS REPORT

## 2020-07-02 LAB — SARS-COV-2, NAA 2 DAY TAT

## 2020-07-05 ENCOUNTER — Other Ambulatory Visit: Payer: Self-pay | Admitting: Nurse Practitioner

## 2020-07-05 ENCOUNTER — Encounter: Payer: Self-pay | Admitting: Family Medicine

## 2020-07-05 MED ORDER — ALBUTEROL SULFATE (2.5 MG/3ML) 0.083% IN NEBU: 2.5000 mg | INHALATION_SOLUTION | Freq: Four times a day (QID) | RESPIRATORY_TRACT | 1 refills | Status: AC | PRN

## 2020-07-05 MED ORDER — HYDROCODONE-HOMATROPINE 5-1.5 MG/5ML PO SYRP
5.0000 mL | ORAL_SOLUTION | Freq: Three times a day (TID) | ORAL | 0 refills | Status: DC | PRN
Start: 2020-07-05 — End: 2020-09-06

## 2020-07-05 NOTE — Telephone Encounter (Signed)
Pt advised.

## 2020-07-05 NOTE — Telephone Encounter (Signed)
I sent in Hycodan cough syrup and albuterol via nebulizer. If she does not have a nebulizer, she will need to get one of these at the pharmacy to help with her SOB.

## 2020-07-05 NOTE — Telephone Encounter (Signed)
Norel is a good decongestant that has phenylephrine in it. If that is on backorder, any over the counter medication with phenylephrine or pseudoephedrine will work. Pseudoephedrine is generally available behind the counter, so you would have to ask the pharmacist for it.  Is she having any fatigue or shortness of breath?

## 2020-07-12 ENCOUNTER — Ambulatory Visit: Payer: BC Managed Care – PPO | Admitting: Gastroenterology

## 2020-07-13 ENCOUNTER — Encounter: Payer: Self-pay | Admitting: Internal Medicine

## 2020-07-21 ENCOUNTER — Inpatient Hospital Stay (HOSPITAL_COMMUNITY): Payer: BC Managed Care – PPO

## 2020-07-21 ENCOUNTER — Other Ambulatory Visit: Payer: Self-pay

## 2020-07-21 ENCOUNTER — Inpatient Hospital Stay (HOSPITAL_COMMUNITY): Payer: BC Managed Care – PPO | Attending: Hematology | Admitting: Hematology

## 2020-07-21 VITALS — BP 126/70 | HR 95 | Temp 97.3°F | Resp 18 | Wt 210.6 lb

## 2020-07-21 DIAGNOSIS — K449 Diaphragmatic hernia without obstruction or gangrene: Secondary | ICD-10-CM | POA: Diagnosis not present

## 2020-07-21 DIAGNOSIS — Z808 Family history of malignant neoplasm of other organs or systems: Secondary | ICD-10-CM | POA: Insufficient documentation

## 2020-07-21 DIAGNOSIS — Z8616 Personal history of COVID-19: Secondary | ICD-10-CM | POA: Diagnosis not present

## 2020-07-21 DIAGNOSIS — D509 Iron deficiency anemia, unspecified: Secondary | ICD-10-CM | POA: Diagnosis present

## 2020-07-21 DIAGNOSIS — R110 Nausea: Secondary | ICD-10-CM | POA: Diagnosis not present

## 2020-07-21 DIAGNOSIS — D500 Iron deficiency anemia secondary to blood loss (chronic): Secondary | ICD-10-CM

## 2020-07-21 DIAGNOSIS — Z809 Family history of malignant neoplasm, unspecified: Secondary | ICD-10-CM | POA: Insufficient documentation

## 2020-07-21 DIAGNOSIS — Z832 Family history of diseases of the blood and blood-forming organs and certain disorders involving the immune mechanism: Secondary | ICD-10-CM | POA: Insufficient documentation

## 2020-07-21 DIAGNOSIS — K2100 Gastro-esophageal reflux disease with esophagitis, without bleeding: Secondary | ICD-10-CM | POA: Diagnosis not present

## 2020-07-21 DIAGNOSIS — Z79899 Other long term (current) drug therapy: Secondary | ICD-10-CM | POA: Diagnosis not present

## 2020-07-21 DIAGNOSIS — R5383 Other fatigue: Secondary | ICD-10-CM | POA: Diagnosis not present

## 2020-07-21 LAB — CBC WITH DIFFERENTIAL/PLATELET
Abs Immature Granulocytes: 0.03 10*3/uL (ref 0.00–0.07)
Basophils Absolute: 0 10*3/uL (ref 0.0–0.1)
Basophils Relative: 0 %
Eosinophils Absolute: 0 10*3/uL (ref 0.0–0.5)
Eosinophils Relative: 1 %
HCT: 35.1 % — ABNORMAL LOW (ref 36.0–46.0)
Hemoglobin: 10.6 g/dL — ABNORMAL LOW (ref 12.0–15.0)
Immature Granulocytes: 0 %
Lymphocytes Relative: 17 %
Lymphs Abs: 1.5 10*3/uL (ref 0.7–4.0)
MCH: 25.4 pg — ABNORMAL LOW (ref 26.0–34.0)
MCHC: 30.2 g/dL (ref 30.0–36.0)
MCV: 84.2 fL (ref 80.0–100.0)
Monocytes Absolute: 0.6 10*3/uL (ref 0.1–1.0)
Monocytes Relative: 6 %
Neutro Abs: 6.7 10*3/uL (ref 1.7–7.7)
Neutrophils Relative %: 76 %
Platelets: 224 10*3/uL (ref 150–400)
RBC: 4.17 MIL/uL (ref 3.87–5.11)
RDW: 14.6 % (ref 11.5–15.5)
WBC: 8.8 10*3/uL (ref 4.0–10.5)
nRBC: 0 % (ref 0.0–0.2)

## 2020-07-21 LAB — FERRITIN: Ferritin: 30 ng/mL (ref 11–307)

## 2020-07-21 LAB — IRON AND TIBC
Iron: 27 ug/dL — ABNORMAL LOW (ref 28–170)
Saturation Ratios: 7 % — ABNORMAL LOW (ref 10.4–31.8)
TIBC: 362 ug/dL (ref 250–450)
UIBC: 335 ug/dL

## 2020-07-21 LAB — TSH: TSH: 1.574 u[IU]/mL (ref 0.350–4.500)

## 2020-07-21 LAB — FOLATE: Folate: 21.1 ng/mL (ref >5.9)

## 2020-07-21 LAB — VITAMIN B12: Vitamin B-12: 453 pg/mL (ref 180–914)

## 2020-07-21 NOTE — Progress Notes (Signed)
[facility] [address], [place] [address]   CLINIC:  Medical Oncology/Hematology  Patient Care Team: Freddy Finner, NP as PCP - General (Family Medicine)  CHIEF COMPLAINTS/PURPOSE OF CONSULTATION:  Evaluation of iron deficiency anemia  HISTORY OF PRESENTING ILLNESS:  Martha [name] 40 y.o. female is here because of evaluation of iron deficiency anemia, at the request of Tana [place], [place], from [facility]. Her hemoglobin has been low since 07/20/2009 and borderline microcytic.  Today she reports feeling okay. She notes that her feet feel cold frequently. She also reports having nausea almost every morning since November 2021; she notes that her heartburn has been acting up more in the same period and she is taking Protonix. She admits having low energy levels. She has not had a blood transfusion previously and has been taking iron tablets for about 18 months; she notes that she does not feel as cold when taking the iron tablets and denies having constipation or stomach aches. She denies having pica. Her bleeding is normal for 5 days every 28 days; she denies having melena, hematochezia or hematuria. She denies recent infections, F/C, night sweats, unexplained weight loss or adenopathy. She was recently diagnosed with COVID and needed oxygen.  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 Her cousin also has similar issues and requires blood transfusions; her maternal aunt had some form of nasopharyngeal cancer. She denies family history of sickle cell or thalassemia.   MEDICAL HISTORY:  Past Medical History:  Diagnosis Date  . Allergy   . Anemia    Phreesia 12/03/2019  . ANEMIA 08/09/2010   Qualifier: Diagnosis of  By: Lillia Mountain LPN, Brandi    . Breast feeding status of mother 07/07/2015  . Heartburn   . High cholesterol   . Multiparity 06/25/2015  . Postpartum care following vaginal delivery (12/31) 06/25/2015  . Status  post tubal ligation at time of delivery, current hospitalization 06/27/2015  . Vomiting blood     SURGICAL HISTORY: Past Surgical History:  Procedure Laterality Date  . BIOPSY  03/30/2020   Procedure: BIOPSY;  Surgeon: Corbin Ade, MD;  [facility]: [facility];  Service: Endoscopy;;  . CHOLECYSTECTOMY N/A    Phreesia 05/23/2020  . COLONOSCOPY N/A 03/30/2020   Procedure: COLONOSCOPY;  Surgeon: Corbin Ade, MD;  Location: [facility];  Service: Endoscopy;  Laterality: N/A;  9:00am  . cyst removed from right wrist     X3  . ESOPHAGOGASTRODUODENOSCOPY  09/2005   Dr. Jena Gauss: very small hiatal hernia  . ESOPHAGOGASTRODUODENOSCOPY  01/2010   Dr. Jena Gauss: small hiatal hernia  . ESOPHAGOGASTRODUODENOSCOPY N/A 03/30/2020   Procedure: ESOPHAGOGASTRODUODENOSCOPY (EGD);  Surgeon: Corbin Ade, MD;  Location: [facility];  Service: Endoscopy;  Laterality: N/A;  . laser surgery on cervix     to remove polyp  . TUBAL LIGATION Bilateral 06/26/2015   Procedure: POST PARTUM TUBAL LIGATION;  Surgeon: Maxie Better, MD;  Location: [facility];  Service: Gynecology;  Laterality: Bilateral;    SOCIAL HISTORY: Social History   Socioeconomic History  . Marital status: Married    Spouse name: Martha [name]   . Number of children: 2  . Years of education: Not on file  . Highest education level: Bachelor's degree (e.g., BA, AB, BS)  Occupational History  . Occupation: Runner, broadcasting/film/video    Comment: special education  Tobacco Use  . Smoking status: Never Smoker  . Smokeless tobacco: Never Used  Substance and Sexual Activity  .  Alcohol use: Yes    Comment: socially  . Drug use: No  . Sexual activity: Yes    Birth control/protection: Surgical  Other Topics Concern  . Not on file  Social History Narrative   Lives with Martha [name]-husband 10 years and children: Aspen 6 and Rhyland 4      Enjoys: cooking, teaching      Diet: eats all food groups- no pork   Caffeine: 1 cup of coffee, tea daily   Water: 4-5 daily          Wears seat belt   Does not use phone while driving   Psychologist, sport and exercise at home   [employer]             Social Determinants of Health   Financial Resource Strain: Low Risk   . Difficulty of Paying Living Expenses: Not hard at all  Food Insecurity: No Food Insecurity  . Worried About Programme researcher, broadcasting/film/video in the Last Year: Never true  . Ran Out of Food in the Last Year: Never true  Transportation Needs: No Transportation Needs  . Lack of Transportation (Medical): No  . Lack of Transportation (Non-Medical): No  Physical Activity: Insufficiently Active  . Days of Exercise per Week: 2 days  . Minutes of Exercise per Session: 30 min  Stress: No Stress Concern Present  . Feeling of Stress : Only a little  Social Connections: Moderately Integrated  . Frequency of Communication with Friends and Family: More than three times a week  . Frequency of Social Gatherings with Friends and Family: Once a week  . Attends Religious Services: More than 4 times per year  . Active Member of Clubs or Organizations: No  . Attends Banker Meetings: Never  . Marital Status: Married  Catering manager Violence: Not At Risk  . Fear of Current or Ex-Partner: No  . Emotionally Abused: No  . Physically Abused: No  . Sexually Abused: No    FAMILY HISTORY: Family History  Problem Relation Age of Onset  . Asthma Mother   . Allergies Mother   . Cancer Maternal Aunt        facial  . Cancer Maternal Grandmother   . Colon cancer Neg Hx   . Inflammatory bowel disease Neg Hx   . Celiac disease Neg Hx     ALLERGIES:  is allergic to penicillins.  MEDICATIONS:  Current Outpatient Medications  Medication Sig Dispense Refill  . acetaminophen (TYLENOL) 500 MG tablet Take 500-1,000 mg by mouth every 6 (six) hours as needed (pain.).    Marland Kitchen albuterol (PROVENTIL) (2.5 MG/3ML) 0.083% nebulizer solution Take 3 mLs (2.5 mg total) by nebulization every 6 (six) hours as needed for wheezing or  shortness of breath. 150 mL 1  . albuterol (VENTOLIN HFA) 108 (90 Base) MCG/ACT inhaler Inhale 2 puffs into the lungs every 6 (six) hours as needed for wheezing or shortness of breath. 8 g 0  . benzonatate (TESSALON) 100 MG capsule Take 1 capsule (100 mg total) by mouth 2 (two) times daily as needed for cough. 20 capsule 0  . ferrous sulfate 325 (65 FE) MG EC tablet Take 1 tablet (325 mg total) by mouth daily. 30 tablet 0  . HYDROcodone-homatropine (HYCODAN) 5-1.5 MG/5ML syrup Take 5 mLs by mouth every 8 (eight) hours as needed for cough. 120 mL 0  . pantoprazole (PROTONIX) 40 MG tablet Take 40 mg by mouth daily.     No current facility-administered medications for this visit.  REVIEW OF SYSTEMS:   Review of Systems  Constitutional: Positive for fatigue (50%). Negative for appetite change, chills, diaphoresis, fever and unexpected weight change.  Gastrointestinal: Positive for nausea (almost every AM). Negative for abdominal pain, blood in stool and constipation.  Genitourinary: Negative for hematuria and menstrual problem.   Neurological: Positive for numbness (cold sensation frequently).  Hematological: Negative for adenopathy.  All other systems reviewed and are negative.    PHYSICAL EXAMINATION: ECOG PERFORMANCE STATUS: 1 - Symptomatic but completely ambulatory  Vitals:   07/21/20 1323  BP: 126/70  Pulse: 95  Resp: 18  Temp: (!) 97.3 F (36.3 C)  SpO2: 100%   Filed Weights   07/21/20 1323  Weight: 210 lb 9.6 oz (95.5 kg)   Physical Exam Vitals reviewed.  Constitutional:      Appearance: Normal appearance. She is obese.  Cardiovascular:     Rate and Rhythm: Normal rate and regular rhythm.     Pulses: Normal pulses.     Heart sounds: Normal heart sounds.  Pulmonary:     Effort: Pulmonary effort is normal.     Breath sounds: Normal breath sounds.  Abdominal:     Palpations: Abdomen is soft. There is no hepatomegaly, splenomegaly or mass.     Tenderness: There is  no abdominal tenderness.     Hernia: No hernia is present.  Musculoskeletal:     Right lower leg: No edema.     Left lower leg: No edema.  Neurological:     General: No focal deficit present.     Mental Status: She is alert and oriented to person, place, and time.  Psychiatric:        Mood and Affect: Mood normal.        Behavior: Behavior normal.      LABORATORY DATA:  I have reviewed the data as listed Recent Results (from the past 2160 hour(s))  CBC with Differential     Status: Abnormal   Collection Time: 06/10/20  2:06 PM  Result Value Ref Range   WBC 8.2 3.8 - 10.8 Thousand/uL   RBC 4.35 3.80 - 5.10 Million/uL   Hemoglobin 11.0 (L) 11.7 - 15.5 g/dL   HCT 53.9 (L) 76.7 - 34.1 %   MCV 80.0 80.0 - 100.0 fL   MCH 25.3 (L) 27.0 - 33.0 pg   MCHC 31.6 (L) 32.0 - 36.0 g/dL   RDW 93.7 90.2 - 40.9 %   Platelets 242 140 - 400 Thousand/uL   MPV 11.9 7.5 - 12.5 fL   Neutro Abs 5,453 1,500 - 7,800 cells/uL   Lymphs Abs 1,984 850 - 3,900 cells/uL   Absolute Monocytes 689 200 - 950 cells/uL   Eosinophils Absolute 57 15 - 500 cells/uL   Basophils Absolute 16 0 - 200 cells/uL   Neutrophils Relative % 66.5 %   Total Lymphocyte 24.2 %   Monocytes Relative 8.4 %   Eosinophils Relative 0.7 %   Basophils Relative 0.2 %  Ferritin     Status: None   Collection Time: 06/10/20  2:06 PM  Result Value Ref Range   Ferritin 25 16 - 154 ng/mL  Novel Coronavirus, NAA ([facility])     Status: Abnormal   Collection Time: 06/30/20 12:00 AM   Specimen: Nasopharyngeal(NP) swabs in vial transport medium   Nasopharynge  Screenin  Result Value Ref Range   SARS-CoV-2, NAA Detected (A) Not Detected    Comment: Patients who have a positive COVID-19 test result may now have treatment options.  Treatment options are available for patients with mild to moderate symptoms and for hospitalized patients. Visit our website at [email or web address] for resources and information. This nucleic acid  amplification test was developed and its performance characteristics determined by [employer]. Nucleic acid amplification tests include RT-PCR and TMA. This test has not been FDA cleared or approved. This test has been authorized by FDA under an Emergency Use Authorization (EUA). This test is only authorized for the duration of time the declaration that circumstances exist justifying the authorization of the emergency use of in vitro diagnostic tests for detection of SARS-CoV-2 virus and/or diagnosis of COVID-19 infection under section 564(b)(1) of the Act, 21 U.S.C. 476LYY-5(K) (1), unless the authorization is terminated or revoked sooner. When diagnostic testing is negativ e, the possibility of a false negative result should be considered in the context of a patient's recent exposures and the presence of clinical signs and symptoms consistent with COVID-19. An individual without symptoms of COVID-19 and who is not shedding SARS-CoV-2 virus would expect to have a negative (not detected) result in this assay.   SARS-COV-2, NAA 2 DAY TAT     Status: None   Collection Time: 06/30/20 12:00 AM   Nasopharynge  Screenin  Result Value Ref Range   SARS-CoV-2, NAA 2 DAY TAT Performed   Specimen status report     Status: None   Collection Time: 06/30/20 12:00 AM  Result Value Ref Range   specimen status report Comment     Comment: Please note Please note The date and/or time of collection was not indicated on the requisition as required by state and federal law.  The date of receipt of the specimen was used as the collection date if not supplied.     RADIOGRAPHIC STUDIES: I have personally reviewed the radiological images as listed and agreed with the findings in the report. No results found.  ASSESSMENT:  1.  Iron deficiency anemia: -Labs on 06/10/2020 with hemoglobin 11 and ferritin of 25. -She has been taking prescription iron for the last 2 months and OTC iron for the  last 1 and half year without improvement in energy levels. -Complains of feeling tired.  No history of transfusion.  No ice pica. -She has regular menses, 5 out of every 28 days.  No bleeding per rectum reported. -EGD on 03/30/2020 with mild erosive reflux esophagitis, medium sized hiatal hernia.  Normal duodenum. -Colonoscopy on 03/30/2020 was normal.  Normal distal ileum.  2.  Social/family history: -She works as a Pension scheme manager. -Maternal first cousin had to have blood transfusions.  Maternal aunt had nasopharyngeal cancer.   PLAN:  1.  Iron deficiency anemia: -She did not show any improvement in ferritin after 1 and half year of being on oral iron therapy. -Clinically she feels tired. -Recommend repeating ferritin and iron panel, CBC today.  We will also check for other nutritional deficiencies. -I have recommended 2 infusions of Feraheme.  We discussed side effects including serious allergic reactions.  She has mild GI side effects from penicillin.  No premeds needed. -We will reevaluate her in 3 months with repeat CBC, ferritin and iron panel.  2.  GERD: -Likely from a hiatal hernia. -Continue Protonix.   All questions were answered. The patient knows to call the clinic with any problems, questions or concerns.   Doreatha Massed, MD 07/21/20 2:21 PM  [facility] [phone]   I, Drue Second, am acting as a scribe for Dr. Payton Mccallum.  Dillon Bjork  Ellin SabaKatragadda MD, have reviewed the above documentation for accuracy and completeness, and I agree with the above.

## 2020-07-21 NOTE — Patient Instructions (Addendum)
[facility] at [facility] Discharge Instructions  You were seen and examined today by Dr. Ellin Saba. Dr. Ellin Saba is a hematologist, meaning he specializes in blood disorders. Dr. Ellin Saba discussed your past medical history, family history of blood disorders/cancers, and the events that led to you being here today.  You were sent here because of anemia. Dr. Ellin Saba has recommended additional lab work. Dr. Ellin Saba is trying to identify if there is a definitive cause for your chronic anemia. Your iron has not responded well to oral iron supplements. Dr. Ellin Saba discussed iron infusions. These are given here by way of at least two infusions in the clinic - each infusion is a week apart.  Dr. Ellin Saba will see you back in about 3 months to see how you responded to IV iron and recheck your lab work.   Thank you for choosing [facility] at [facility] to provide your oncology and hematology care.  To afford each patient quality time with our provider, please arrive at least 15 minutes before your scheduled appointment time.   If you have a lab appointment with the Cancer Center please come in thru the Main Entrance and check in at the main information desk.  You need to re-schedule your appointment should you arrive 10 or more minutes late.  We strive to give you quality time with our providers, and arriving late affects you and other patients whose appointments are after yours.  Also, if you no show three or more times for appointments you may be dismissed from the clinic at the providers discretion.     Again, thank you for choosing [facility].  Our hope is that these requests will decrease the amount of time that you wait before being seen by our physicians.       _____________________________________________________________  Should you have questions after your visit to [facility], please contact our office at  [phone] and follow the prompts.  Our office hours are 8:00 a.m. and 4:30 p.m. Monday - Friday.  Please note that voicemails left after 4:00 p.m. may not be returned until the following business day.  We are closed weekends and major holidays.  You do have access to a nurse 24-7, just call the main number to the clinic [phone] and do not press any options, hold on the line and a nurse will answer the phone.    For prescription refill requests, have your pharmacy contact our office and allow 72 hours.    Due to Covid, you will need to wear a mask upon entering the hospital. If you do not have a mask, a mask will be given to you at the Main Entrance upon arrival. For doctor visits, patients may have 1 support person age 39 or older with them. For treatment visits, patients can not have anyone with them due to social distancing guidelines and our immunocompromised population.

## 2020-07-22 LAB — COPPER, SERUM: Copper: 150 ug/dL (ref 80–158)

## 2020-07-24 ENCOUNTER — Encounter (HOSPITAL_COMMUNITY): Payer: Self-pay

## 2020-07-25 DIAGNOSIS — D509 Iron deficiency anemia, unspecified: Secondary | ICD-10-CM | POA: Insufficient documentation

## 2020-07-25 LAB — PROTEIN ELECTROPHORESIS, SERUM
A/G Ratio: 1 (ref 0.7–1.7)
Albumin ELP: 3.6 g/dL (ref 2.9–4.4)
Alpha-1-Globulin: 0.3 g/dL (ref 0.0–0.4)
Alpha-2-Globulin: 0.7 g/dL (ref 0.4–1.0)
Beta Globulin: 1.1 g/dL (ref 0.7–1.3)
Gamma Globulin: 1.5 g/dL (ref 0.4–1.8)
Globulin, Total: 3.5 g/dL (ref 2.2–3.9)
Total Protein ELP: 7.1 g/dL (ref 6.0–8.5)

## 2020-07-26 ENCOUNTER — Inpatient Hospital Stay (HOSPITAL_COMMUNITY): Payer: BC Managed Care – PPO | Attending: Hematology

## 2020-07-26 ENCOUNTER — Other Ambulatory Visit: Payer: Self-pay

## 2020-07-26 ENCOUNTER — Encounter (HOSPITAL_COMMUNITY): Payer: Self-pay

## 2020-07-26 VITALS — BP 119/63 | HR 80 | Temp 97.0°F | Resp 17

## 2020-07-26 DIAGNOSIS — R110 Nausea: Secondary | ICD-10-CM | POA: Insufficient documentation

## 2020-07-26 DIAGNOSIS — K2100 Gastro-esophageal reflux disease with esophagitis, without bleeding: Secondary | ICD-10-CM | POA: Diagnosis not present

## 2020-07-26 DIAGNOSIS — D509 Iron deficiency anemia, unspecified: Secondary | ICD-10-CM | POA: Insufficient documentation

## 2020-07-26 DIAGNOSIS — R5383 Other fatigue: Secondary | ICD-10-CM | POA: Insufficient documentation

## 2020-07-26 DIAGNOSIS — K920 Hematemesis: Secondary | ICD-10-CM | POA: Insufficient documentation

## 2020-07-26 MED ORDER — ACETAMINOPHEN 325 MG PO TABS
ORAL_TABLET | ORAL | Status: AC
  Filled 2020-07-26: qty 2

## 2020-07-26 MED ORDER — LORATADINE 10 MG PO TABS
ORAL_TABLET | ORAL | Status: AC
  Filled 2020-07-26: qty 1

## 2020-07-26 MED ORDER — FAMOTIDINE 20 MG PO TABS
20.0000 mg | ORAL_TABLET | Freq: Once | ORAL | Status: AC
  Administered 2020-07-26: 20 mg via ORAL

## 2020-07-26 MED ORDER — SODIUM CHLORIDE 0.9 % IV SOLN
200.0000 mg | Freq: Once | INTRAVENOUS | Status: AC
  Administered 2020-07-26: 200 mg via INTRAVENOUS
  Filled 2020-07-26: qty 10

## 2020-07-26 MED ORDER — LORATADINE 10 MG PO TABS
10.0000 mg | ORAL_TABLET | Freq: Once | ORAL | Status: AC
  Administered 2020-07-26: 10 mg via ORAL

## 2020-07-26 MED ORDER — FAMOTIDINE 20 MG PO TABS
ORAL_TABLET | ORAL | Status: AC
  Filled 2020-07-26: qty 1

## 2020-07-26 MED ORDER — ACETAMINOPHEN 325 MG PO TABS
650.0000 mg | ORAL_TABLET | Freq: Once | ORAL | Status: AC
  Administered 2020-07-26: 650 mg via ORAL

## 2020-07-26 MED ORDER — SODIUM CHLORIDE 0.9 % IV SOLN: Freq: Once | INTRAVENOUS | Status: AC

## 2020-07-26 NOTE — Patient Instructions (Signed)
[facility] at [facility]  Discharge Instructions:  Ferumoxytol injection What is this medicine? FERUMOXYTOL is an iron complex. Iron is used to make healthy red blood cells, which carry oxygen and nutrients throughout the body. This medicine is used to treat iron deficiency anemia. This medicine may be used for other purposes; ask your health care provider or pharmacist if you have questions. COMMON BRAND NAME(S): Feraheme What should I tell my health care provider before I take this medicine? They need to know if you have any of these conditions:  anemia not caused by low iron levels  high levels of iron in the blood  magnetic resonance imaging (MRI) test scheduled  an unusual or allergic reaction to iron, other medicines, foods, dyes, or preservatives  pregnant or trying to get pregnant  breast-feeding How should I use this medicine? This medicine is for injection into a vein. It is given by a health care professional in a hospital or clinic setting. Talk to your pediatrician regarding the use of this medicine in children. Special care may be needed. Overdosage: If you think you have taken too much of this medicine contact a poison control center or emergency room at once. NOTE: This medicine is only for you. Do not share this medicine with others. What if I miss a dose? It is important not to miss your dose. Call your doctor or health care professional if you are unable to keep an appointment. What may interact with this medicine? This medicine may interact with the following medications:  other iron products This list may not describe all possible interactions. Give your health care provider a list of all the medicines, herbs, non-prescription drugs, or dietary supplements you use. Also tell them if you smoke, drink alcohol, or use illegal drugs. Some items may interact with your medicine. What should I watch for while using this medicine? Visit your  doctor or healthcare professional regularly. Tell your doctor or healthcare professional if your symptoms do not start to get better or if they get worse. You may need blood work done while you are taking this medicine. You may need to follow a special diet. Talk to your doctor. Foods that contain iron include: whole grains/cereals, dried fruits, beans, or peas, leafy green vegetables, and organ meats (liver, kidney). What side effects may I notice from receiving this medicine? Side effects that you should report to your doctor or health care professional as soon as possible:  allergic reactions like skin rash, itching or hives, swelling of the face, lips, or tongue  breathing problems  changes in blood pressure  feeling faint or lightheaded, falls  fever or chills  flushing, sweating, or hot feelings  swelling of the ankles or feet Side effects that usually do not require medical attention (report to your doctor or health care professional if they continue or are bothersome):  diarrhea  headache  nausea, vomiting  stomach pain This list may not describe all possible side effects. Call your doctor for medical advice about side effects. You may report side effects to FDA at 1-800-FDA-1088. Where should I keep my medicine? This drug is given in a hospital or clinic and will not be stored at home. NOTE: This sheet is a summary. It may not cover all possible information. If you have questions about this medicine, talk to your doctor, pharmacist, or health care provider.  2021 Elsevier/Gold Standard (2016-07-30 20:21:10)  _______________________________________________________________  Thank you for choosing [facility] at   [facility] to provide your oncology and hematology care.  To afford each patient quality time with our providers, please arrive at least 15 minutes before your scheduled appointment.  You need to re-schedule your appointment if you arrive 10 or  more minutes late.  We strive to give you quality time with our providers, and arriving late affects you and other patients whose appointments are after yours.  Also, if you no show three or more times for appointments you may be dismissed from the clinic.  Again, thank you for choosing [facility] at [facility]. Our hope is that these requests will allow you access to exceptional care and in a timely manner. _______________________________________________________________  If you have questions after your visit, please contact our office at [phone] between the hours of 8:30 a.m. and 5:00 p.m. Voicemails left after 4:30 p.m. will not be returned until the following business day. _______________________________________________________________  For prescription refill requests, have your pharmacy contact our office. _______________________________________________________________  Recommendations made by the consultant and any test results will be sent to your referring physician. _______________________________________________________________ 

## 2020-07-26 NOTE — Progress Notes (Addendum)
Martha [name] presents today for IV Venofer infusion per MDs orders in supportive therapy plan. Vitals have been reviewed and are stable and within parameters for infusion today. Premedications given per orders prior to infusion. Infusion given without incident or complaint. Patient observed for 30 minutes post infusion per orders. VSS upon completion of infusion, IV flushed and removed per protocol, see MAR and IV flowsheet for details. Discharged in satisfactory condition with follow up instructions.

## 2020-07-27 LAB — METHYLMALONIC ACID, SERUM: Methylmalonic Acid, Quantitative: 92 nmol/L (ref 0–378)

## 2020-07-29 ENCOUNTER — Other Ambulatory Visit: Payer: Self-pay

## 2020-07-29 ENCOUNTER — Inpatient Hospital Stay (HOSPITAL_COMMUNITY): Payer: BC Managed Care – PPO

## 2020-07-29 ENCOUNTER — Encounter (HOSPITAL_COMMUNITY): Payer: Self-pay

## 2020-07-29 VITALS — BP 110/76 | HR 70 | Temp 97.1°F | Resp 18

## 2020-07-29 DIAGNOSIS — D509 Iron deficiency anemia, unspecified: Secondary | ICD-10-CM | POA: Diagnosis not present

## 2020-07-29 MED ORDER — LORATADINE 10 MG PO TABS
ORAL_TABLET | ORAL | Status: AC
  Filled 2020-07-29: qty 1

## 2020-07-29 MED ORDER — LORATADINE 10 MG PO TABS
10.0000 mg | ORAL_TABLET | Freq: Once | ORAL | Status: AC
  Administered 2020-07-29: 10 mg via ORAL

## 2020-07-29 MED ORDER — ACETAMINOPHEN 325 MG PO TABS
ORAL_TABLET | ORAL | Status: AC
  Filled 2020-07-29: qty 2

## 2020-07-29 MED ORDER — SODIUM CHLORIDE 0.9 % IV SOLN: Freq: Once | INTRAVENOUS | Status: AC

## 2020-07-29 MED ORDER — ACETAMINOPHEN 325 MG PO TABS
650.0000 mg | ORAL_TABLET | Freq: Once | ORAL | Status: AC
  Administered 2020-07-29: 650 mg via ORAL

## 2020-07-29 MED ORDER — FAMOTIDINE 20 MG PO TABS
ORAL_TABLET | ORAL | Status: AC
  Filled 2020-07-29: qty 1

## 2020-07-29 MED ORDER — SODIUM CHLORIDE 0.9 % IV SOLN
200.0000 mg | Freq: Once | INTRAVENOUS | Status: AC
  Administered 2020-07-29: 200 mg via INTRAVENOUS
  Filled 2020-07-29: qty 200

## 2020-07-29 MED ORDER — FAMOTIDINE 20 MG PO TABS
20.0000 mg | ORAL_TABLET | Freq: Once | ORAL | Status: AC
  Administered 2020-07-29: 20 mg via ORAL

## 2020-07-29 NOTE — Progress Notes (Signed)
Patient here today for Venofer per MD orders.   She tolerated infusion without incidence.  Patient discharged ambulatory and in stable condition from clinic.  She will follow up next week as scheduled.

## 2020-08-02 ENCOUNTER — Inpatient Hospital Stay (HOSPITAL_COMMUNITY): Payer: BC Managed Care – PPO

## 2020-08-02 ENCOUNTER — Encounter (HOSPITAL_COMMUNITY): Payer: Self-pay

## 2020-08-02 ENCOUNTER — Other Ambulatory Visit: Payer: Self-pay

## 2020-08-02 VITALS — BP 118/65 | HR 84 | Temp 97.2°F | Resp 17

## 2020-08-02 DIAGNOSIS — D509 Iron deficiency anemia, unspecified: Secondary | ICD-10-CM | POA: Diagnosis not present

## 2020-08-02 MED ORDER — ACETAMINOPHEN 325 MG PO TABS
ORAL_TABLET | ORAL | Status: AC
  Filled 2020-08-02: qty 2

## 2020-08-02 MED ORDER — FAMOTIDINE 20 MG PO TABS
ORAL_TABLET | ORAL | Status: AC
  Filled 2020-08-02: qty 1

## 2020-08-02 MED ORDER — LORATADINE 10 MG PO TABS
10.0000 mg | ORAL_TABLET | Freq: Once | ORAL | Status: AC
  Administered 2020-08-02: 10 mg via ORAL

## 2020-08-02 MED ORDER — SODIUM CHLORIDE 0.9 % IV SOLN
200.0000 mg | Freq: Once | INTRAVENOUS | Status: AC
  Administered 2020-08-02: 200 mg via INTRAVENOUS
  Filled 2020-08-02: qty 200

## 2020-08-02 MED ORDER — FAMOTIDINE 20 MG PO TABS
20.0000 mg | ORAL_TABLET | Freq: Once | ORAL | Status: AC
  Administered 2020-08-02: 20 mg via ORAL

## 2020-08-02 MED ORDER — ACETAMINOPHEN 325 MG PO TABS
650.0000 mg | ORAL_TABLET | Freq: Once | ORAL | Status: AC
  Administered 2020-08-02: 650 mg via ORAL

## 2020-08-02 MED ORDER — SODIUM CHLORIDE 0.9 % IV SOLN: Freq: Once | INTRAVENOUS | Status: AC

## 2020-08-02 NOTE — Progress Notes (Signed)
Patient tolerated iron infusion with no complaints voiced.  Peripheral IV site clean and dry with good blood return noted before and after infusion.  Band aid applied.  VSS with discharge and left in satisfactory condition with no s/s of distress noted.   

## 2020-08-05 ENCOUNTER — Inpatient Hospital Stay (HOSPITAL_COMMUNITY): Payer: BC Managed Care – PPO

## 2020-08-05 ENCOUNTER — Encounter (HOSPITAL_COMMUNITY): Payer: Self-pay

## 2020-08-05 ENCOUNTER — Other Ambulatory Visit: Payer: Self-pay

## 2020-08-05 VITALS — BP 122/75 | HR 83 | Temp 97.0°F | Resp 18

## 2020-08-05 DIAGNOSIS — D509 Iron deficiency anemia, unspecified: Secondary | ICD-10-CM

## 2020-08-05 MED ORDER — FAMOTIDINE 20 MG PO TABS
ORAL_TABLET | ORAL | Status: AC
  Filled 2020-08-05: qty 1

## 2020-08-05 MED ORDER — SODIUM CHLORIDE 0.9 % IV SOLN: Freq: Once | INTRAVENOUS | Status: AC

## 2020-08-05 MED ORDER — LORATADINE 10 MG PO TABS
ORAL_TABLET | ORAL | Status: AC
  Filled 2020-08-05: qty 1

## 2020-08-05 MED ORDER — LORATADINE 10 MG PO TABS
10.0000 mg | ORAL_TABLET | Freq: Once | ORAL | Status: AC
  Administered 2020-08-05: 10 mg via ORAL

## 2020-08-05 MED ORDER — ACETAMINOPHEN 325 MG PO TABS
ORAL_TABLET | ORAL | Status: AC
  Filled 2020-08-05: qty 2

## 2020-08-05 MED ORDER — ACETAMINOPHEN 325 MG PO TABS
650.0000 mg | ORAL_TABLET | Freq: Once | ORAL | Status: AC
  Administered 2020-08-05: 650 mg via ORAL

## 2020-08-05 MED ORDER — SODIUM CHLORIDE 0.9 % IV SOLN
200.0000 mg | Freq: Once | INTRAVENOUS | Status: AC
  Administered 2020-08-05: 200 mg via INTRAVENOUS
  Filled 2020-08-05: qty 200

## 2020-08-05 MED ORDER — FAMOTIDINE 20 MG PO TABS
20.0000 mg | ORAL_TABLET | Freq: Once | ORAL | Status: AC
  Administered 2020-08-05: 20 mg via ORAL

## 2020-08-05 NOTE — Progress Notes (Signed)
Venofer given today per MD orders. Tolerated infusion without adverse affects. Vital signs stable. No complaints at this time. Discharged from clinic ambulatory in stable condition. Alert and oriented x 3. F/U with [facility] as scheduled.  

## 2020-08-08 ENCOUNTER — Inpatient Hospital Stay (HOSPITAL_COMMUNITY): Payer: BC Managed Care – PPO

## 2020-08-08 ENCOUNTER — Other Ambulatory Visit: Payer: Self-pay

## 2020-08-08 VITALS — BP 128/63 | HR 82 | Temp 97.0°F | Resp 16

## 2020-08-08 DIAGNOSIS — D509 Iron deficiency anemia, unspecified: Secondary | ICD-10-CM | POA: Diagnosis not present

## 2020-08-08 MED ORDER — FAMOTIDINE 20 MG PO TABS: 20.0000 mg | ORAL_TABLET | Freq: Once | ORAL | Status: DC

## 2020-08-08 MED ORDER — SODIUM CHLORIDE 0.9 % IV SOLN: Freq: Once | INTRAVENOUS | Status: AC

## 2020-08-08 MED ORDER — SODIUM CHLORIDE 0.9 % IV SOLN
200.0000 mg | Freq: Once | INTRAVENOUS | Status: AC
  Administered 2020-08-08: 200 mg via INTRAVENOUS
  Filled 2020-08-08: qty 200

## 2020-08-08 MED ORDER — ACETAMINOPHEN 325 MG PO TABS: 650.0000 mg | ORAL_TABLET | Freq: Once | ORAL | Status: DC

## 2020-08-08 MED ORDER — LORATADINE 10 MG PO TABS: 10.0000 mg | ORAL_TABLET | Freq: Once | ORAL | Status: DC

## 2020-08-08 NOTE — Patient Instructions (Signed)
[facility] at [facility]  Discharge Instructions:  IV Venofer received today. Follow up as previously scheduled.  Iron Sucrose injection What is this medicine? IRON SUCROSE (AHY ern SOO krohs) is an iron complex. Iron is used to make healthy red blood cells, which carry oxygen and nutrients throughout the body. This medicine is used to treat iron deficiency anemia in people with chronic kidney disease. This medicine may be used for other purposes; ask your health care provider or pharmacist if you have questions. COMMON BRAND NAME(S): Venofer What should I tell my health care provider before I take this medicine? They need to know if you have any of these conditions:  anemia not caused by low iron levels  heart disease  high levels of iron in the blood  kidney disease  liver disease  an unusual or allergic reaction to iron, other medicines, foods, dyes, or preservatives  pregnant or trying to get pregnant  breast-feeding How should I use this medicine? This medicine is for infusion into a vein. It is given by a health care professional in a hospital or clinic setting. Talk to your pediatrician regarding the use of this medicine in children. While this drug may be prescribed for children as young as 2 years for selected conditions, precautions do apply. Overdosage: If you think you have taken too much of this medicine contact a poison control center or emergency room at once. NOTE: This medicine is only for you. Do not share this medicine with others. What if I miss a dose? It is important not to miss your dose. Call your doctor or health care professional if you are unable to keep an appointment. What may interact with this medicine? Do not take this medicine with any of the following medications:  deferoxamine  dimercaprol  other iron products This medicine may also interact with the following medications:  chloramphenicol  deferasirox This  list may not describe all possible interactions. Give your health care provider a list of all the medicines, herbs, non-prescription drugs, or dietary supplements you use. Also tell them if you smoke, drink alcohol, or use illegal drugs. Some items may interact with your medicine. What should I watch for while using this medicine? Visit your doctor or healthcare professional regularly. Tell your doctor or healthcare professional if your symptoms do not start to get better or if they get worse. You may need blood work done while you are taking this medicine. You may need to follow a special diet. Talk to your doctor. Foods that contain iron include: whole grains/cereals, dried fruits, beans, or peas, leafy green vegetables, and organ meats (liver, kidney). What side effects may I notice from receiving this medicine? Side effects that you should report to your doctor or health care professional as soon as possible:  allergic reactions like skin rash, itching or hives, swelling of the face, lips, or tongue  breathing problems  changes in blood pressure  cough  fast, irregular heartbeat  feeling faint or lightheaded, falls  fever or chills  flushing, sweating, or hot feelings  joint or muscle aches/pains  seizures  swelling of the ankles or feet  unusually weak or tired Side effects that usually do not require medical attention (report to your doctor or health care professional if they continue or are bothersome):  diarrhea  feeling achy  headache  irritation at site where injected  nausea, vomiting  stomach upset  tiredness This list may not describe all possible side effects.  Call your doctor for medical advice about side effects. You may report side effects to FDA at 1-800-FDA-1088. Where should I keep my medicine? This drug is given in a hospital or clinic and will not be stored at home. NOTE: This sheet is a summary. It may not cover all possible information. If you  have questions about this medicine, talk to your doctor, pharmacist, or health care provider.  2021 Elsevier/Gold Standard (2011-03-22 17:14:35)  _______________________________________________________________  Thank you for choosing [facility] at [facility] to provide your oncology and hematology care.  To afford each patient quality time with our providers, please arrive at least 15 minutes before your scheduled appointment.  You need to re-schedule your appointment if you arrive 10 or more minutes late.  We strive to give you quality time with our providers, and arriving late affects you and other patients whose appointments are after yours.  Also, if you no show three or more times for appointments you may be dismissed from the clinic.  Again, thank you for choosing [facility] at [facility]. Our hope is that these requests will allow you access to exceptional care and in a timely manner. _______________________________________________________________  If you have questions after your visit, please contact our office at [phone] between the hours of 8:30 a.m. and 5:00 p.m. Voicemails left after 4:30 p.m. will not be returned until the following business day. _______________________________________________________________  For prescription refill requests, have your pharmacy contact our office. _______________________________________________________________  Recommendations made by the consultant and any test results will be sent to your referring physician. _______________________________________________________________

## 2020-08-08 NOTE — Progress Notes (Signed)
Martha [name] presents today for IV Venofer infusion per MDs orders in supportive therapy plan. Vitals have been reviewed and are stable and within parameters for infusion today. Pt states she took her premidications (tylenol, pepcid, and claritin) prior to arrival today. Infusion given without incident or complaint. Patient observed for 30 minutes post infusion per orders. VSS upon completion of infusion, IV flushed and removed per protocol, see MAR and IV flowsheet for details. Discharged in satisfactory condition with follow up instructions.

## 2020-08-10 ENCOUNTER — Ambulatory Visit (HOSPITAL_COMMUNITY): Payer: Self-pay

## 2020-08-18 ENCOUNTER — Ambulatory Visit: Payer: Self-pay | Admitting: Gastroenterology

## 2020-08-18 ENCOUNTER — Encounter: Payer: Self-pay | Admitting: Family Medicine

## 2020-08-22 ENCOUNTER — Other Ambulatory Visit: Payer: Self-pay

## 2020-08-22 ENCOUNTER — Telehealth (INDEPENDENT_AMBULATORY_CARE_PROVIDER_SITE_OTHER): Payer: BC Managed Care – PPO | Admitting: Nurse Practitioner

## 2020-08-22 ENCOUNTER — Encounter: Payer: Self-pay | Admitting: Nurse Practitioner

## 2020-08-22 DIAGNOSIS — J069 Acute upper respiratory infection, unspecified: Secondary | ICD-10-CM

## 2020-08-22 MED ORDER — NOREL AD 4-10-325 MG PO TABS: 1.0000 | ORAL_TABLET | ORAL | 1 refills | Status: DC | PRN

## 2020-08-22 MED ORDER — AZITHROMYCIN 250 MG PO TABS: ORAL_TABLET | ORAL | 0 refills | Status: DC

## 2020-08-22 NOTE — Progress Notes (Signed)
Acute Office Visit  Subjective:    Patient ID: Martha [name], female    DOB: Nov 03, 1980, 40 y.o.   MRN: [id number]  Chief Complaint  Patient presents with  . Sinusitis    Thick green mucus x 1 week.   . Cough    Dry hacking cough x 1 week.     HPI Patient is in today for sick visit. She has voice changes.  She has been using her inhaler.   Past Medical History:  Diagnosis Date  . Allergy   . Anemia    Phreesia 12/03/2019  . ANEMIA 08/09/2010   Qualifier: Diagnosis of  By: Lillia Mountain LPN, Brandi    . Breast feeding status of mother 07/07/2015  . Heartburn   . High cholesterol   . Multiparity 06/25/2015  . Postpartum care following vaginal delivery (12/31) 06/25/2015  . Status post tubal ligation at time of delivery, current hospitalization 06/27/2015  . Vomiting blood     Past Surgical History:  Procedure Laterality Date  . BIOPSY  03/30/2020   Procedure: BIOPSY;  Surgeon: Corbin Ade, MD;  [facility];  Service: Endoscopy;;  . CHOLECYSTECTOMY N/A    Phreesia 05/23/2020  . COLONOSCOPY N/A 03/30/2020   Procedure: COLONOSCOPY;  Surgeon: Corbin Ade, MD;  [facility]: [facility];  Service: Endoscopy;  Laterality: N/A;  9:00am  . cyst removed from right wrist     X3  . ESOPHAGOGASTRODUODENOSCOPY  09/2005   Dr. Jena Gauss: very small hiatal hernia  . ESOPHAGOGASTRODUODENOSCOPY  01/2010   Dr. Jena Gauss: small hiatal hernia  . ESOPHAGOGASTRODUODENOSCOPY N/A 03/30/2020   Procedure: ESOPHAGOGASTRODUODENOSCOPY (EGD);  Surgeon: Corbin Ade, MD;  [facility];  Service: Endoscopy;  Laterality: N/A;  . laser surgery on cervix     to remove polyp  . TUBAL LIGATION Bilateral 06/26/2015   Procedure: POST PARTUM TUBAL LIGATION;  Surgeon: Maxie Better, MD;  Location: [facility];  Service: Gynecology;  Laterality: Bilateral;    Family History  Problem Relation Age of Onset  . Asthma Mother   . Allergies Mother   . Cancer Maternal Aunt        facial  . Cancer  Maternal Grandmother   . Colon cancer Neg Hx   . Inflammatory bowel disease Neg Hx   . Celiac disease Neg Hx     Social History   Socioeconomic History  . Marital status: Married    Spouse name: [name]   . Number of children: 2  . Years of education: Not on file  . Highest education level: Bachelor's degree (e.g., BA, AB, BS)  Occupational History  . Occupation: Runner, broadcasting/film/video    Comment: special education  Tobacco Use  . Smoking status: Never Smoker  . Smokeless tobacco: Never Used  Substance and Sexual Activity  . Alcohol use: Yes    Comment: socially  . Drug use: No  . Sexual activity: Yes    Birth control/protection: Surgical  Other Topics Concern  . Not on file  Social History Narrative   Lives with Hunter-husband 10 years and children: Aspen 6 and Rhyland 4      Enjoys: cooking, teaching      Diet: eats all food groups- no pork   Caffeine: 1 cup of coffee, tea daily   Water: 4-5 daily         Wears seat belt   Does not use phone while driving   Psychologist, sport and exercise at home   [employer]  Social Determinants of Health   Financial Resource Strain: Low Risk   . Difficulty of Paying Living Expenses: Not hard at all  Food Insecurity: No Food Insecurity  . Worried About Programme researcher, broadcasting/film/video in the Last Year: Never true  . Ran Out of Food in the Last Year: Never true  Transportation Needs: No Transportation Needs  . Lack of Transportation (Medical): No  . Lack of Transportation (Non-Medical): No  Physical Activity: Insufficiently Active  . Days of Exercise per Week: 2 days  . Minutes of Exercise per Session: 30 min  Stress: No Stress Concern Present  . Feeling of Stress : Only a little  Social Connections: Moderately Integrated  . Frequency of Communication with Friends and Family: More than three times a week  . Frequency of Social Gatherings with Friends and Family: Once a week  . Attends Religious Services: More than 4 times per year  . Active  Member of Clubs or Organizations: No  . Attends Banker Meetings: Never  . Marital Status: Married  Catering manager Violence: Not At Risk  . Fear of Current or Ex-Partner: No  . Emotionally Abused: No  . Physically Abused: No  . Sexually Abused: No    Outpatient Medications Prior to Visit  Medication Sig Dispense Refill  . acetaminophen (TYLENOL) 500 MG tablet Take 500-1,000 mg by mouth every 6 (six) hours as needed (pain.).    Marland Kitchen albuterol (PROVENTIL) (2.5 MG/3ML) 0.083% nebulizer solution Take 3 mLs (2.5 mg total) by nebulization every 6 (six) hours as needed for wheezing or shortness of breath. 150 mL 1  . albuterol (VENTOLIN HFA) 108 (90 Base) MCG/ACT inhaler Inhale 2 puffs into the lungs every 6 (six) hours as needed for wheezing or shortness of breath. 8 g 0  . benzonatate (TESSALON) 100 MG capsule Take 1 capsule (100 mg total) by mouth 2 (two) times daily as needed for cough. 20 capsule 0  . ferrous sulfate 325 (65 FE) MG EC tablet Take 1 tablet (325 mg total) by mouth daily. 30 tablet 0  . HYDROcodone-homatropine (HYCODAN) 5-1.5 MG/5ML syrup Take 5 mLs by mouth every 8 (eight) hours as needed for cough. 120 mL 0  . pantoprazole (PROTONIX) 40 MG tablet Take 40 mg by mouth daily.    . Prenatal Vit-Fe Fumarate-FA (PRENATAL VITAMINS PO)     . ranitidine (ZANTAC) 150 MG capsule 1 cap(s)    . Doxylamine-Pyridoxine (DICLEGIS PO)      No facility-administered medications prior to visit.    Allergies  Allergen Reactions  . Penicillins Diarrhea and Nausea And Vomiting    Has patient had a PCN reaction causing immediate rash, facial/tongue/throat swelling, SOB or lightheadedness with hypotension: No Has patient had a PCN reaction causing severe rash involving mucus membranes or skin necrosis: No Has patient had a PCN reaction that required hospitalization No Has patient had a PCN reaction occurring within the last 10 years: Yes If all of the above answers are "NO", then may  proceed with Cephalosporin use.     Review of Systems  Constitutional: Negative for chills, fatigue and fever.  HENT: Positive for congestion, rhinorrhea, sinus pain and sneezing. Negative for sore throat.   Respiratory: Positive for cough.   Cardiovascular: Negative.        Objective:    Physical Exam  There were no vitals taken for this visit. Wt Readings from Last 3 Encounters:  07/21/20 210 lb 9.6 oz (95.5 kg)  05/23/20 200 lb (90.7  kg)  03/30/20 200 lb (90.7 kg)    Health Maintenance Due  Topic Date Due  . PAP SMEAR-Modifier  09/11/2015  . COVID-19 Vaccine (3 - Booster for Pfizer series) 02/24/2020    There are no preventive care reminders to display for this patient.   Lab Results  Component Value Date   TSH 1.574 07/21/2020   Lab Results  Component Value Date   WBC 8.8 07/21/2020   HGB 10.6 (L) 07/21/2020   HCT 35.1 (L) 07/21/2020   MCV 84.2 07/21/2020   PLT 224 07/21/2020   Lab Results  Component Value Date   NA 139 02/26/2020   K 4.3 02/26/2020   CO2 26 02/26/2020   GLUCOSE 77 02/26/2020   BUN 11 02/26/2020   CREATININE 0.90 02/26/2020   BILITOT 0.4 02/26/2020   AST 17 02/26/2020   ALT 14 02/26/2020   PROT 7.2 02/26/2020   CALCIUM 9.4 02/26/2020   Lab Results  Component Value Date   CHOL 173 10/21/2012   Lab Results  Component Value Date   HDL 48 10/21/2012   Lab Results  Component Value Date   LDLCALC 111 (H) 10/21/2012   Lab Results  Component Value Date   TRIG 68 10/21/2012   Lab Results  Component Value Date   CHOLHDL 3.6 10/21/2012   Lab Results  Component Value Date   HGBA1C 5.5 01/29/2020       Assessment & Plan:   Problem List Items Addressed This Visit      Respiratory   URI (upper respiratory infection)    -symptomatic x 1 week; allergic to [facility] -Rx. z-pack and norel      Relevant Medications   azithromycin (ZITHROMAX) 250 MG tablet       Meds ordered this encounter  Medications  . azithromycin  (ZITHROMAX) 250 MG tablet    Sig: Take as directed    Dispense:  6 tablet    Refill:  0    Please dispense as a z-pack  . Chlorphen-PE-Acetaminophen (NOREL AD) 4-10-325 MG TABS    Sig: Take 1 tablet by mouth every 4 (four) hours as needed (nasal congestion, cold symptoms).    Dispense:  20 tablet    Refill:  1   Date:  08/22/2020   Location of Patient: Home Location of Provider: Office Consent was obtain for visit to be over via telehealth. I verified that I am speaking with the correct person using two identifiers.  I connected with  [place] Shawnie Dapper on 08/22/20 via telephone and verified that I am speaking with the correct person using two identifiers.   I discussed the limitations of evaluation and management by telemedicine. The patient expressed understanding and agreed to proceed.  Time spent 8 minutes    Heather Roberts, NP

## 2020-08-22 NOTE — Assessment & Plan Note (Signed)
-  symptomatic x 1 week; allergic to [facility] -Rx. z-pack and norel

## 2020-08-31 ENCOUNTER — Encounter: Payer: Self-pay | Admitting: Gastroenterology

## 2020-08-31 ENCOUNTER — Other Ambulatory Visit: Payer: Self-pay

## 2020-08-31 ENCOUNTER — Ambulatory Visit (INDEPENDENT_AMBULATORY_CARE_PROVIDER_SITE_OTHER): Payer: BC Managed Care – PPO | Admitting: Gastroenterology

## 2020-08-31 VITALS — BP 120/81 | HR 80 | Temp 97.5°F | Ht 67.0 in | Wt 211.0 lb

## 2020-08-31 DIAGNOSIS — K2100 Gastro-esophageal reflux disease with esophagitis, without bleeding: Secondary | ICD-10-CM

## 2020-08-31 DIAGNOSIS — D509 Iron deficiency anemia, unspecified: Secondary | ICD-10-CM | POA: Diagnosis not present

## 2020-08-31 MED ORDER — PANTOPRAZOLE SODIUM 40 MG PO TBEC: 40.0000 mg | DELAYED_RELEASE_TABLET | Freq: Two times a day (BID) | ORAL | 5 refills | Status: DC

## 2020-08-31 NOTE — Progress Notes (Signed)
Referring Provider: Freddy Finner, NP Primary Care Physician:  Freddy Finner, NP Primary GI: Dr. Jena Gauss   Chief Complaint  Patient presents with  . Gastroesophageal Reflux  . Anemia    infusions    HPI:   Martha [name] is a 40 y.o. female presenting today with a history of GERD, IDA, here for follow-up after EGD/colonoscopy. EGD with erosive reflux esophagitis, medium hiatal hernia, normal duodenum, negative H.pylori. Colonoscopy unrevealing. Celiac serologies negative. Seeing Hematology for IDA. Recent Hgb 10.6 in Jan 2022. Ferritin 30. One day of heavy menses. May need capsule study if persistent or no improvement. Received round of IV iron infusions recently. Labs upcoming in April 2022.    Bad nocturnal reflux. Eats between 530 and 6, then goes to bed around 930 and 1030. Drinks ginger ale, no dark sodas. Avoids fried foods. Eats some fatty foods. Only one cup of coffee per day. Limited chocolate. Feels like liquid are all floating. Sometimes skips meals because of reflux being significant.   Past Medical History:  Diagnosis Date  . Allergy   . Anemia    Phreesia 12/03/2019  . ANEMIA 08/09/2010   Qualifier: Diagnosis of  By: Lillia Mountain LPN, Brandi    . Breast feeding status of mother 07/07/2015  . Heartburn   . High cholesterol   . Multiparity 06/25/2015  . Postpartum care following vaginal delivery (12/31) 06/25/2015  . Status post tubal ligation at time of delivery, current hospitalization 06/27/2015  . Vomiting blood     Past Surgical History:  Procedure Laterality Date  . BIOPSY  03/30/2020   Procedure: BIOPSY;  Surgeon: Corbin Ade, MD;  [facility]: [facility];  Service: Endoscopy;;  . CHOLECYSTECTOMY N/A    Phreesia 05/23/2020  . COLONOSCOPY N/A 03/30/2020   normal colon, normal TI.   . cyst removed from right wrist     X3  . ESOPHAGOGASTRODUODENOSCOPY  09/2005   Dr. Jena Gauss: very small hiatal hernia  . ESOPHAGOGASTRODUODENOSCOPY  01/2010   Dr. Jena Gauss:  small hiatal hernia  . ESOPHAGOGASTRODUODENOSCOPY N/A 03/30/2020   mild erosive reflux esophagitis, medium-sized hiatal hernia s/p biopsy, normal duodenum. Schatzki's ring. Negative H.pylori.   . laser surgery on cervix     to remove polyp  . TUBAL LIGATION Bilateral 06/26/2015   Procedure: POST PARTUM TUBAL LIGATION;  Surgeon: Maxie Better, MD;  Location: [facility];  Service: Gynecology;  Laterality: Bilateral;    Current Outpatient Medications  Medication Sig Dispense Refill  . acetaminophen (TYLENOL) 500 MG tablet Take 500-1,000 mg by mouth every 6 (six) hours as needed (pain.).    Marland Kitchen albuterol (PROVENTIL) (2.5 MG/3ML) 0.083% nebulizer solution Take 3 mLs (2.5 mg total) by nebulization every 6 (six) hours as needed for wheezing or shortness of breath. 150 mL 1  . albuterol (VENTOLIN HFA) 108 (90 Base) MCG/ACT inhaler Inhale 2 puffs into the lungs every 6 (six) hours as needed for wheezing or shortness of breath. 8 g 0  . benzonatate (TESSALON) 100 MG capsule Take 1 capsule (100 mg total) by mouth 2 (two) times daily as needed for cough. 20 capsule 0  . Multiple Vitamin (MULTIVITAMIN) tablet Take 1 tablet by mouth daily.    . pantoprazole (PROTONIX) 40 MG tablet Take 40 mg by mouth daily.    . pantoprazole (PROTONIX) 40 MG tablet Take 1 tablet (40 mg total) by mouth 2 (two) times daily before a meal. 60 tablet 5  . Vitamin D-Vitamin K (K2  PLUS D3 PO) Take by mouth daily.     No current facility-administered medications for this visit.    Allergies as of 08/31/2020 - Review Complete 08/31/2020  Allergen Reaction Noted  . Penicillins Diarrhea and Nausea And Vomiting 08/23/2010    Family History  Problem Relation Age of Onset  . Asthma Mother   . Allergies Mother   . Cancer Maternal Aunt        facial  . Cancer Maternal Grandmother   . Colon cancer Neg Hx   . Inflammatory bowel disease Neg Hx   . Celiac disease Neg Hx     Social History   Socioeconomic History  . Marital  status: Married    Spouse name: [name]   . Number of children: 2  . Years of education: Not on file  . Highest education level: Bachelor's degree (e.g., BA, AB, BS)  Occupational History  . Occupation: Runner, broadcasting/film/video    Comment: special education  Tobacco Use  . Smoking status: Never Smoker  . Smokeless tobacco: Never Used  Substance and Sexual Activity  . Alcohol use: Yes    Comment: socially  . Drug use: No  . Sexual activity: Yes    Birth control/protection: Surgical  Other Topics Concern  . Not on file  Social History Narrative   Lives with Hunter-husband 10 years and children: Aspen 6 and Rhyland 4      Enjoys: cooking, teaching      Diet: eats all food groups- no pork   Caffeine: 1 cup of coffee, tea daily   Water: 4-5 daily         Wears seat belt   Does not use phone while driving   Psychologist, sport and exercise at home   [employer]             Social Determinants of Health   Financial Resource Strain: Low Risk   . Difficulty of Paying Living Expenses: Not hard at all  Food Insecurity: No Food Insecurity  . Worried About Programme researcher, broadcasting/film/video in the Last Year: Never true  . Ran Out of Food in the Last Year: Never true  Transportation Needs: No Transportation Needs  . Lack of Transportation (Medical): No  . Lack of Transportation (Non-Medical): No  Physical Activity: Insufficiently Active  . Days of Exercise per Week: 2 days  . Minutes of Exercise per Session: 30 min  Stress: No Stress Concern Present  . Feeling of Stress : Only a little  Social Connections: Moderately Integrated  . Frequency of Communication with Friends and Family: More than three times a week  . Frequency of Social Gatherings with Friends and Family: Once a week  . Attends Religious Services: More than 4 times per year  . Active Member of Clubs or Organizations: No  . Attends Banker Meetings: Never  . Marital Status: Married    Review of Systems: Gen: Denies fever, chills,  anorexia. Denies fatigue, weakness, weight loss.  CV: Denies chest pain, palpitations, syncope, peripheral edema, and claudication. Resp: Denies dyspnea at rest, cough, wheezing, coughing up blood, and pleurisy. GI: see HPI Derm: Denies rash, itching, dry skin Psych: Denies depression, anxiety, memory loss, confusion. No homicidal or suicidal ideation.  Heme: Denies bruising, bleeding, and enlarged lymph nodes.  Physical Exam: BP 120/81   Pulse 80   Temp (!) 97.5 F (36.4 C) (Temporal)   Ht 5\' 7"  (1.702 m)   Wt 211 lb (95.7 kg)   LMP 08/28/2020 (Exact Date)  BMI 33.05 kg/m  General:   Alert and oriented. No distress noted. Pleasant and cooperative.  Head:  Normocephalic and atraumatic. Eyes:  Conjuctiva clear without scleral icterus. Mouth:  Mask in place Abdomen:  +BS, soft, non-tender and non-distended. No rebound or guarding. No HSM or masses noted. Msk:  Symmetrical without gross deformities. Normal posture. Extremities:  Without edema. Neurologic:  Alert and  oriented x4 Psych:  Alert and cooperative. Normal mood and affect.  ASSESSMENT: Martha [name] is a 40 y.o. female presenting today with a history of GERD, IDA, here for follow-up after EGD/colonoscopy. EGD with erosive reflux esophagitis, medium hiatal hernia, normal duodenum, negative H.pylori. Colonoscopy unrevealing. Celiac serologies negative. Seeing Hematology for IDA. Recent Hgb 10.6 in Jan 2022. Ferritin 30. One day of heavy menses. May need capsule study if persistent or no improvement. Received round of IV iron infusions recently. Labs upcoming in April 2022. Now with persistent refractory GERD.  GERD: she is attempting to employ all diet/behavior strategies, avoiding late night eating, trigger foods. Known medium hiatal hernia. Doubt we are dealing with underlying gastroparesis as she has no nausea, vomiting, and no significant risk factors. We discussed continuing Protonix daily, add Pepcid at night, and she may  bump up PPI to BID if no improvement. We discussed referral to surgeon to consider hiatal hernia repair, reflux surgery, etc. I discussed with her that they may request for additional evaluation prior to doing this; in order to not order things unnecessarily, we will start with a referral. Happy to pursue other evaluations if requested by surgeon.   IDA: followed by Hematology and recently completed round of  IV iron. We may need to pursue a capsule study to wrap up the evaluation. Celiac serologies negative. Menstrual losses could be playing a role. Will see her back in 3 months after labs completed by Hematology in future. Will pursue capsule if needed at that time.     PLAN:  Protonix daily, may increase to BID if needed. Pepcid prn at night General Surgery referral for consideration of hiatal hernia repair Continue Hematology follow-up Consider capsule study Return in 3 months for close follow-up   Gelene Mink, PhD, ANP-BC [facility]

## 2020-08-31 NOTE — Patient Instructions (Addendum)
Continue the dietary and behavior measures you are doing to help decrease reflux.  Continue Protonix daily, 30 minutes before breakfast. You can add over-the-counter Pepcid at bedtime to help with nighttime symptoms. If this is not helpful, I have sent in a prescription for twice a day Protonix.  I am referring you to a surgeon to consider hiatal hernia repair.  We will see you back in 3-4 months!   It was a pleasure to see you today. I want to create trusting relationships with patients to provide genuine, compassionate, and quality care. I value your feedback. If you receive a survey regarding your visit,  I greatly appreciate you taking time to fill this out.   Gelene Mink, PhD, ANP-BC [facility]    [employer] [phone] (pp. [phone]). [place], [place]: Elsevier.">  Gastroesophageal Reflux Disease, Adult Gastroesophageal reflux (GER) happens when acid from the stomach flows up into the tube that connects the mouth and the stomach (esophagus). Normally, food travels down the esophagus and stays in the stomach to be digested. However, when a person has GER, food and stomach acid sometimes move back up into the esophagus. If this becomes a more serious problem, the person may be diagnosed with a disease called gastroesophageal reflux disease (GERD). GERD occurs when the reflux:  Happens often.  Causes frequent or severe symptoms.  Causes problems such as damage to the esophagus. When stomach acid comes in contact with the esophagus, the acid may cause inflammation in the esophagus. Over time, GERD may create small holes (ulcers) in the lining of the esophagus. What are the causes? This condition is caused by a problem with the muscle between the esophagus and the stomach (lower esophageal sphincter, or LES). Normally, the LES muscle closes after food passes through the esophagus to the stomach. When the LES is weakened or abnormal, it does not close properly, and  that allows food and stomach acid to go back up into the esophagus. The LES can be weakened by certain dietary substances, medicines, and medical conditions, including:  Tobacco use.  Pregnancy.  Having a hiatal hernia.  Alcohol use.  Certain foods and beverages, such as coffee, chocolate, onions, and peppermint. What increases the risk? You are more likely to develop this condition if you:  Have an increased body weight.  Have a connective tissue disorder.  Take NSAIDs, such as ibuprofen. What are the signs or symptoms? Symptoms of this condition include:  Heartburn.  Difficult or painful swallowing and the feeling of having a lump in the throat.  A bitter taste in the mouth.  Bad breath and having a large amount of saliva.  Having an upset or bloated stomach and belching.  Chest pain. Different conditions can cause chest pain. Make sure you see your health care provider if you experience chest pain.  Shortness of breath or wheezing.  Ongoing (chronic) cough or a nighttime cough.  Wearing away of tooth enamel.  Weight loss. How is this diagnosed? This condition may be diagnosed based on a medical history and a physical exam. To determine if you have mild or severe GERD, your health care provider may also monitor how you respond to treatment. You may also have tests, including:  A test to examine your stomach and esophagus with a small camera (endoscopy).  A test that measures the acidity level in your esophagus.  A test that measures how much pressure is on your esophagus.  A barium swallow or modified barium swallow test to  show the shape, size, and functioning of your esophagus. How is this treated? Treatment for this condition may vary depending on how severe your symptoms are. Your health care provider may recommend:  Changes to your diet.  Medicine.  Surgery. The goal of treatment is to help relieve your symptoms and to prevent complications. Follow  these instructions at home: Eating and drinking  Follow a diet as recommended by your health care provider. This may involve avoiding foods and drinks such as: ? Coffee and tea, with or without caffeine. ? Drinks that contain alcohol. ? Energy drinks and sports drinks. ? Carbonated drinks or sodas. ? Chocolate and cocoa. ? Peppermint and mint flavorings. ? Garlic and onions. ? Horseradish. ? Spicy and acidic foods, including peppers, chili powder, curry powder, vinegar, hot sauces, and barbecue sauce. ? Citrus fruit juices and citrus fruits, such as oranges, lemons, and limes. ? Tomato-based foods, such as red sauce, chili, salsa, and pizza with red sauce. ? Fried and fatty foods, such as donuts, french fries, potato chips, and high-fat dressings. ? High-fat meats, such as hot dogs and fatty cuts of red and white meats, such as rib eye steak, sausage, ham, and bacon. ? High-fat dairy items, such as whole milk, butter, and cream cheese.  Eat small, frequent meals instead of large meals.  Avoid drinking large amounts of liquid with your meals.  Avoid eating meals during the 2-3 hours before bedtime.  Avoid lying down right after you eat.  Do not exercise right after you eat.   Lifestyle  Do not use any products that contain nicotine or tobacco. These products include cigarettes, chewing tobacco, and vaping devices, such as e-cigarettes. If you need help quitting, ask your health care provider.  Try to reduce your stress by using methods such as yoga or meditation. If you need help reducing stress, ask your health care provider.  If you are overweight, reduce your weight to an amount that is healthy for you. Ask your health care provider for guidance about a safe weight loss goal.   General instructions  Pay attention to any changes in your symptoms.  Take over-the-counter and prescription medicines only as told by your health care provider. Do not take aspirin, ibuprofen, or  other NSAIDs unless your health care provider told you to take these medicines.  Wear loose-fitting clothing. Do not wear anything tight around your waist that causes pressure on your abdomen.  Raise (elevate) the head of your bed about 6 inches (15 cm). You can use a wedge to do this.  Avoid bending over if this makes your symptoms worse.  Keep all follow-up visits. This is important. Contact a health care provider if:  You have: ? New symptoms. ? Unexplained weight loss. ? Difficulty swallowing or it hurts to swallow. ? Wheezing or a persistent cough. ? A hoarse voice.  Your symptoms do not improve with treatment. Get help right away if:  You have sudden pain in your arms, neck, jaw, teeth, or back.  You suddenly feel sweaty, dizzy, or light-headed.  You have chest pain or shortness of breath.  You vomit and the vomit is green, yellow, or black, or it looks like blood or coffee grounds.  You faint.  You have stool that is red, bloody, or black.  You cannot swallow, drink, or eat. These symptoms may represent a serious problem that is an emergency. Do not wait to see if the symptoms will go away. Get medical help right away.  Call your local emergency services (911 in the [place] Do not drive yourself to the hospital. Summary  Gastroesophageal reflux happens when acid from the stomach flows up into the esophagus. GERD is a disease in which the reflux happens often, causes frequent or severe symptoms, or causes problems such as damage to the esophagus.  Treatment for this condition may vary depending on how severe your symptoms are. Your health care provider may recommend diet and lifestyle changes, medicine, or surgery.  Contact a health care provider if you have new or worsening symptoms.  Take over-the-counter and prescription medicines only as told by your health care provider. Do not take aspirin, ibuprofen, or other NSAIDs unless your health care provider told you to do  so.  Keep all follow-up visits as told by your health care provider. This is important. This information is not intended to replace advice given to you by your health care provider. Make sure you discuss any questions you have with your health care provider. Document Revised: 12/21/2019 Document Reviewed: 12/21/2019 Elsevier Patient Education  2021 [employer]  Food Choices for Gastroesophageal Reflux Disease, Adult When you have gastroesophageal reflux disease (GERD), the foods you eat and your eating habits are very important. Choosing the right foods can help ease the discomfort of GERD. Consider working with a dietitian to help you make healthy food choices. What are tips for following this plan? Reading food labels  Look for foods that are low in saturated fat. Foods that have less than 5% of daily value (DV) of fat and 0 g of trans fats may help with your symptoms. Cooking  Cook foods using methods other than frying. This may include baking, steaming, grilling, or broiling. These are all methods that do not need a lot of fat for cooking.  To add flavor, try to use herbs that are low in spice and acidity. Meal planning  Choose healthy foods that are low in fat, such as fruits, vegetables, whole grains, low-fat dairy products, lean meats, fish, and poultry.  Eat frequent, small meals instead of three large meals each day. Eat your meals slowly, in a relaxed setting. Avoid bending over or lying down until 2-3 hours after eating.  Limit high-fat foods such as fatty meats or fried foods.  Limit your intake of fatty foods, such as oils, butter, and shortening.  Avoid the following as told by your health care provider: ? Foods that cause symptoms. These may be different for different people. Keep a food diary to keep track of foods that cause symptoms. ? Alcohol. ? Drinking large amounts of liquid with meals. ? Eating meals during the 2-3 hours before bed.   Lifestyle  Maintain a  healthy weight. Ask your health care provider what weight is healthy for you. If you need to lose weight, work with your health care provider to do so safely.  Exercise for at least 30 minutes on 5 or more days each week, or as told by your health care provider.  Avoid wearing clothes that fit tightly around your waist and chest.  Do not use any products that contain nicotine or tobacco. These products include cigarettes, chewing tobacco, and vaping devices, such as e-cigarettes. If you need help quitting, ask your health care provider.  Sleep with the head of your bed raised. Use a wedge under the mattress or blocks under the bed frame to raise the head of the bed.  Chew sugar-free gum after mealtimes. What foods should I eat? Eat  a healthy, well-balanced diet of fruits, vegetables, whole grains, low-fat dairy products, lean meats, fish, and poultry. Each person is different. Foods that may trigger symptoms in one person may not trigger any symptoms in another person. Work with your health care provider to identify foods that are safe for you. The items listed above may not be a complete list of recommended foods and beverages. Contact a dietitian for more information.   What foods should I avoid? Limiting some of these foods may help manage the symptoms of GERD. Everyone is different. Consult a dietitian or your health care provider to help you identify the exact foods to avoid, if any. Fruits Any fruits prepared with added fat. Any fruits that cause symptoms. For some people this may include citrus fruits, such as oranges, grapefruit, pineapple, and lemons. Vegetables Deep-fried vegetables. [place] fries. Any vegetables prepared with added fat. Any vegetables that cause symptoms. For some people, this may include tomatoes and tomato products, chili peppers, onions and garlic, and horseradish. Grains Pastries or quick breads with added fat. Meats and other proteins High-fat meats, such as  fatty beef or pork, hot dogs, ribs, ham, sausage, salami, and bacon. Fried meat or protein, including fried fish and fried chicken. Nuts and nut butters, in large amounts. Dairy Whole milk and chocolate milk. Sour cream. Cream. Ice cream. Cream cheese. Milkshakes. Fats and oils Butter. Margarine. Shortening. Ghee. Beverages Coffee and tea, with or without caffeine. Carbonated beverages. Sodas. Energy drinks. Fruit juice made with acidic fruits, such as orange or grapefruit. Tomato juice. Alcoholic drinks. Sweets and desserts Chocolate and cocoa. Donuts. Seasonings and condiments Pepper. Peppermint and spearmint. Added salt. Any condiments, herbs, or seasonings that cause symptoms. For some people, this may include curry, hot sauce, or vinegar-based salad dressings. The items listed above may not be a complete list of foods and beverages to avoid. Contact a dietitian for more information. Questions to ask your health care provider Diet and lifestyle changes are usually the first steps that are taken to manage symptoms of GERD. If diet and lifestyle changes do not improve your symptoms, talk with your health care provider about taking medicines. Where to find more information  [employer]: aboutgerd.org Summary  When you have gastroesophageal reflux disease (GERD), food and lifestyle choices may be very helpful in easing the discomfort of GERD.  Eat frequent, small meals instead of three large meals each day. Eat your meals slowly, in a relaxed setting. Avoid bending over or lying down until 2-3 hours after eating.  Limit high-fat foods such as fatty meats or fried foods. This information is not intended to replace advice given to you by your health care provider. Make sure you discuss any questions you have with your health care provider. Document Revised: 12/21/2019 Document Reviewed: 12/21/2019 Elsevier Patient Education  2021 [employer].

## 2020-09-06 ENCOUNTER — Other Ambulatory Visit: Payer: Self-pay | Admitting: *Deleted

## 2020-09-06 DIAGNOSIS — K449 Diaphragmatic hernia without obstruction or gangrene: Secondary | ICD-10-CM

## 2020-09-06 NOTE — Progress Notes (Signed)
Cc'ed to pcp °

## 2020-10-05 ENCOUNTER — Other Ambulatory Visit: Payer: Self-pay | Admitting: General Surgery

## 2020-10-05 DIAGNOSIS — K449 Diaphragmatic hernia without obstruction or gangrene: Secondary | ICD-10-CM

## 2020-10-17 ENCOUNTER — Inpatient Hospital Stay (HOSPITAL_COMMUNITY): Payer: BC Managed Care – PPO | Attending: Hematology

## 2020-10-17 ENCOUNTER — Other Ambulatory Visit: Payer: Self-pay

## 2020-10-17 DIAGNOSIS — R5383 Other fatigue: Secondary | ICD-10-CM | POA: Diagnosis not present

## 2020-10-17 DIAGNOSIS — D509 Iron deficiency anemia, unspecified: Secondary | ICD-10-CM | POA: Insufficient documentation

## 2020-10-17 DIAGNOSIS — R110 Nausea: Secondary | ICD-10-CM | POA: Insufficient documentation

## 2020-10-17 DIAGNOSIS — K2100 Gastro-esophageal reflux disease with esophagitis, without bleeding: Secondary | ICD-10-CM | POA: Insufficient documentation

## 2020-10-17 DIAGNOSIS — D500 Iron deficiency anemia secondary to blood loss (chronic): Secondary | ICD-10-CM

## 2020-10-17 LAB — CBC WITH DIFFERENTIAL/PLATELET
Abs Immature Granulocytes: 0.04 10*3/uL (ref 0.00–0.07)
Basophils Absolute: 0 10*3/uL (ref 0.0–0.1)
Basophils Relative: 0 %
Eosinophils Absolute: 0 10*3/uL (ref 0.0–0.5)
Eosinophils Relative: 0 %
HCT: 39 % (ref 36.0–46.0)
Hemoglobin: 12.2 g/dL (ref 12.0–15.0)
Immature Granulocytes: 0 %
Lymphocytes Relative: 27 %
Lymphs Abs: 2.5 10*3/uL (ref 0.7–4.0)
MCH: 26.8 pg (ref 26.0–34.0)
MCHC: 31.3 g/dL (ref 30.0–36.0)
MCV: 85.5 fL (ref 80.0–100.0)
Monocytes Absolute: 0.8 10*3/uL (ref 0.1–1.0)
Monocytes Relative: 8 %
Neutro Abs: 6.1 10*3/uL (ref 1.7–7.7)
Neutrophils Relative %: 65 %
Platelets: 230 10*3/uL (ref 150–400)
RBC: 4.56 MIL/uL (ref 3.87–5.11)
RDW: 14.4 % (ref 11.5–15.5)
WBC: 9.5 10*3/uL (ref 4.0–10.5)
nRBC: 0 % (ref 0.0–0.2)

## 2020-10-17 LAB — IRON AND TIBC
Iron: 59 ug/dL (ref 28–170)
Saturation Ratios: 18 % (ref 10.4–31.8)
TIBC: 336 ug/dL (ref 250–450)
UIBC: 277 ug/dL

## 2020-10-17 LAB — FERRITIN: Ferritin: 174 ng/mL (ref 11–307)

## 2020-10-24 ENCOUNTER — Encounter (HOSPITAL_COMMUNITY): Payer: Self-pay | Admitting: Hematology

## 2020-10-24 ENCOUNTER — Other Ambulatory Visit: Payer: Self-pay

## 2020-10-24 ENCOUNTER — Inpatient Hospital Stay (HOSPITAL_COMMUNITY): Payer: BC Managed Care – PPO | Attending: Hematology | Admitting: Hematology

## 2020-10-24 VITALS — BP 126/83 | HR 84 | Temp 97.0°F | Resp 18 | Wt 209.4 lb

## 2020-10-24 DIAGNOSIS — Z79899 Other long term (current) drug therapy: Secondary | ICD-10-CM | POA: Diagnosis not present

## 2020-10-24 DIAGNOSIS — K219 Gastro-esophageal reflux disease without esophagitis: Secondary | ICD-10-CM | POA: Diagnosis not present

## 2020-10-24 DIAGNOSIS — D500 Iron deficiency anemia secondary to blood loss (chronic): Secondary | ICD-10-CM

## 2020-10-24 DIAGNOSIS — K449 Diaphragmatic hernia without obstruction or gangrene: Secondary | ICD-10-CM | POA: Diagnosis not present

## 2020-10-24 DIAGNOSIS — Z808 Family history of malignant neoplasm of other organs or systems: Secondary | ICD-10-CM | POA: Diagnosis not present

## 2020-10-24 DIAGNOSIS — D509 Iron deficiency anemia, unspecified: Secondary | ICD-10-CM | POA: Insufficient documentation

## 2020-10-24 NOTE — Progress Notes (Signed)
[facility] [address] , [place] [address]   CLINIC:  Medical Oncology/Hematology  PCP:  Freddy FinnerMills, Martha [name], Martha [name] [address] / [place] [address]  [phone]  REASON FOR VISIT:  Follow-up for IDA  PRIOR THERAPY: Oral iron tablets  CURRENT THERAPY: Intermittent Venofer last on 08/08/2020  INTERVAL HISTORY:  [name], a 40 y.o. female, returns for routine follow-up for her IDA. Martha [name] was last seen on 07/21/2020.  Today she reports feeling well. She complains of having reflux during the night and in the mornings and has only been taking Protonix once daily; she is scheduled to undergo a barium swallow on 05/09 to evaluate the size of her hiatal hernia. She denies having any palpitations or SOB. She denies having heavy bleeding during her menses.   REVIEW OF SYSTEMS:  Review of Systems  Constitutional: Positive for fatigue (75%). Negative for appetite change.  Respiratory: Negative for shortness of breath.   Cardiovascular: Negative for palpitations.  Gastrointestinal: Positive for nausea (reflux in AM).  All other systems reviewed and are negative.   PAST MEDICAL/SURGICAL HISTORY:  Past Medical History:  Diagnosis Date  . Allergy   . Anemia    Phreesia 12/03/2019  . ANEMIA 08/09/2010   Qualifier: Diagnosis of  By: Lillia MountainHudy LPN, Brandi    . Breast feeding status of mother 07/07/2015  . Heartburn   . High cholesterol   . Multiparity 06/25/2015  . Postpartum care following vaginal delivery (12/31) 06/25/2015  . Status post tubal ligation at time of delivery, current hospitalization 06/27/2015  . Vomiting blood    Past Surgical History:  Procedure Laterality Date  . BIOPSY  03/30/2020   Procedure: BIOPSY;  Surgeon: Corbin Adeourk, Robert M, MD;  [facility];  Service: Endoscopy;;  . CHOLECYSTECTOMY N/A    Phreesia 05/23/2020  . COLONOSCOPY N/A 03/30/2020   normal colon, normal TI.   . cyst removed from right wrist     X3  .  ESOPHAGOGASTRODUODENOSCOPY  09/2005   Dr. Jena Gaussourk: very small hiatal hernia  . ESOPHAGOGASTRODUODENOSCOPY  01/2010   Dr. Jena Gaussrourk: small hiatal hernia  . ESOPHAGOGASTRODUODENOSCOPY N/A 03/30/2020   mild erosive reflux esophagitis, medium-sized hiatal hernia s/p biopsy, normal duodenum. Schatzki's ring. Negative H.pylori.   . laser surgery on cervix     to remove polyp  . TUBAL LIGATION Bilateral 06/26/2015   Procedure: POST PARTUM TUBAL LIGATION;  Surgeon: Maxie BetterSheronette Cousins, MD;  Location: [facility];  Service: Gynecology;  Laterality: Bilateral;    SOCIAL HISTORY:  Social History   Socioeconomic History  . Marital status: Married    Spouse name: Martha [name]   . Number of children: 2  . Years of education: Not on file  . Highest education level: Bachelor's degree (e.g., BA, AB, BS)  Occupational History  . Occupation: Runner, broadcasting/film/videoteacher    Comment: special education  Tobacco Use  . Smoking status: Never Smoker  . Smokeless tobacco: Never Used  Substance and Sexual Activity  . Alcohol use: Yes    Comment: socially  . Drug use: No  . Sexual activity: Yes    Birth control/protection: Surgical  Other Topics Concern  . Not on file  Social History Narrative   Lives with Martha [name] 10 years and children: Martha [name] 6 and Martha [name] 4      Enjoys: cooking, teaching      Diet: eats all food groups- no pork   Caffeine: 1 cup of coffee, tea daily   Water: 4-5  daily         Wears seat belt   Does not use phone while driving   Smoke detectors at home   [employer]             Social Determinants of Health   Financial Resource Strain: Low Risk   . Difficulty of Paying Living Expenses: Not hard at all  Food Insecurity: No Food Insecurity  . Worried About Programme researcher, broadcasting/film/video in the Last Year: Never true  . Ran Out of Food in the Last Year: Never true  Transportation Needs: No Transportation Needs  . Lack of Transportation (Medical): No  . Lack of Transportation (Non-Medical): No  Physical  Activity: Insufficiently Active  . Days of Exercise per Week: 2 days  . Minutes of Exercise per Session: 30 min  Stress: No Stress Concern Present  . Feeling of Stress : Only a little  Social Connections: Moderately Integrated  . Frequency of Communication with Friends and Family: More than three times a week  . Frequency of Social Gatherings with Friends and Family: Once a week  . Attends Religious Services: More than 4 times per year  . Active Member of Clubs or Organizations: No  . Attends Banker Meetings: Never  . Marital Status: Married  Catering manager Violence: Not At Risk  . Fear of Current or Ex-Partner: No  . Emotionally Abused: No  . Physically Abused: No  . Sexually Abused: No    FAMILY HISTORY:  Family History  Problem Relation Age of Onset  . Asthma Mother   . Allergies Mother   . Cancer Maternal Aunt        facial  . Cancer Maternal Grandmother   . Colon cancer Neg Hx   . Inflammatory bowel disease Neg Hx   . Celiac disease Neg Hx     CURRENT MEDICATIONS:  Current Outpatient Medications  Medication Sig Dispense Refill  . acetaminophen (TYLENOL) 500 MG tablet Take 500-1,000 mg by mouth every 6 (six) hours as needed (pain.).    Marland Kitchen albuterol (PROVENTIL) (2.5 MG/3ML) 0.083% nebulizer solution Take 3 mLs (2.5 mg total) by nebulization every 6 (six) hours as needed for wheezing or shortness of breath. 150 mL 1  . albuterol (VENTOLIN HFA) 108 (90 Base) MCG/ACT inhaler Inhale 2 puffs into the lungs every 6 (six) hours as needed for wheezing or shortness of breath. 8 g 0  . benzonatate (TESSALON) 100 MG capsule Take 1 capsule (100 mg total) by mouth 2 (two) times daily as needed for cough. 20 capsule 0  . Multiple Vitamin (MULTIVITAMIN) tablet Take 1 tablet by mouth daily.    . pantoprazole (PROTONIX) 40 MG tablet Take 40 mg by mouth daily.    . Vitamin D-Vitamin K (K2 PLUS D3 PO) Take by mouth daily.     No current facility-administered medications for  this visit.    ALLERGIES:  Allergies  Allergen Reactions  . Penicillins Diarrhea and Nausea And Vomiting    Has patient had a PCN reaction causing immediate rash, facial/tongue/throat swelling, SOB or lightheadedness with hypotension: No Has patient had a PCN reaction causing severe rash involving mucus membranes or skin necrosis: No Has patient had a PCN reaction that required hospitalization No Has patient had a PCN reaction occurring within the last 10 years: Yes If all of the above answers are "NO", then may proceed with Cephalosporin use.     PHYSICAL EXAM:  Performance status (ECOG): 1 - Symptomatic but completely  ambulatory  Vitals:   10/24/20 1512  BP: 126/83  Pulse: 84  Resp: 18  Temp: (!) 97 F (36.1 C)  SpO2: 100%   Wt Readings from Last 3 Encounters:  10/24/20 209 lb 6.4 oz (95 kg)  08/31/20 211 lb (95.7 kg)  07/21/20 210 lb 9.6 oz (95.5 kg)   Physical Exam Vitals reviewed.  Constitutional:      Appearance: Normal appearance. She is obese.  Cardiovascular:     Rate and Rhythm: Normal rate and regular rhythm.     Pulses: Normal pulses.     Heart sounds: Normal heart sounds.  Pulmonary:     Effort: Pulmonary effort is normal.     Breath sounds: Normal breath sounds.  Neurological:     General: No focal deficit present.     Mental Status: She is alert and oriented to person, place, and time.  Psychiatric:        Mood and Affect: Mood normal.        Behavior: Behavior normal.     LABORATORY DATA:  I have reviewed the labs as listed.  CBC Latest Ref Rng & Units 10/17/2020 07/21/2020 06/10/2020  WBC 4.0 - 10.5 K/uL 9.5 8.8 8.2  Hemoglobin 12.0 - 15.0 g/dL 48.5 10.6(L) 11.0(L)  Hematocrit 36.0 - 46.0 % 39.0 35.1(L) 34.8(L)  Platelets 150 - 400 K/uL 230 224 242   CMP Latest Ref Rng & Units 02/26/2020 01/30/2011 07/20/2009  Glucose 65 - 99 mg/dL 77 84 84  BUN 7 - 25 mg/dL 11 8 7   Creatinine 0.50 - 1.10 mg/dL 4.62 7.03  Sodium 135 - 146 mmol/L 139 138  140  Potassium 3.5 - 5.3 mmol/L 4.3 4.6 3.9  Chloride 98 - 110 mmol/L 106 105 108  CO2 20 - 32 mmol/L 26 24 26   Calcium 8.6 - 10.2 mg/dL 9.4 9.5 9.0  Total Protein 6.1 - 8.1 g/dL 7.2 - -  Total Bilirubin 0.2 - 1.2 mg/dL 0.4 - -  AST 10 - 30 U/L 17 - -  ALT 6 - 29 U/L 14 - -      Component Value Date/Time   RBC 4.56 10/17/2020 1424   MCV 85.5 10/17/2020 1424   MCH 26.8 10/17/2020 1424   MCHC 31.3 10/17/2020 1424   RDW 14.4 10/17/2020 1424   LYMPHSABS 2.5 10/17/2020 1424   MONOABS 0.8 10/17/2020 1424   EOSABS 0.0 10/17/2020 1424   BASOSABS 0.0 10/17/2020 1424   Lab Results  Component Value Date   TIBC 336 10/17/2020   TIBC 362 07/21/2020   TIBC 367 02/26/2020   FERRITIN 174 10/17/2020   FERRITIN 30 07/21/2020   FERRITIN 25 06/10/2020   IRONPCTSAT 18 10/17/2020   IRONPCTSAT 7 (L) 07/21/2020   IRONPCTSAT 13 (L) 02/26/2020    DIAGNOSTIC IMAGING:  I have independently reviewed the scans and discussed with the patient. No results found.   ASSESSMENT:  1.  Iron deficiency anemia: -Labs on 06/10/2020 with hemoglobin 11 and ferritin of 25. -She has been taking prescription iron for the last 2 months and OTC iron for the last 1 and half year without improvement in energy levels. -Complains of feeling tired.  No history of transfusion.  No ice pica. -She has regular menses, 5 out of every 28 days.  No bleeding per rectum reported. -EGD on 03/30/2020 with mild erosive reflux esophagitis, medium sized hiatal hernia.  Normal duodenum. -Colonoscopy on 03/30/2020 was normal.  Normal distal ileum.  2.  Social/family history: -She works  as a Pension scheme manager. -Maternal first cousin had to have blood transfusions.  Maternal aunt had nasopharyngeal cancer.   PLAN:  1.  Iron deficiency anemia: -She did not show any improvement after taking oral iron previously. - She finished 5 infusions of Venofer on 08/08/2020. - She reported improvement in her energy levels. - We  reviewed her labs from 10/17/2020 which showed improvement in ferritin to 174 from 30 previously.  Hemoglobin also improved to 12.2 from 10.6. - Labs from 07/21/2020 showed normal B12, methylmalonic acid, copper and folic acid levels.  SPEP was also negative. - She is undergoing work-up for hiatal hernia and possible surgery. - She may restart oral iron therapy once she discontinues Protonix. - RTC 6 months for follow-up with repeat labs.  2.  GERD: -This is likely from hiatal hernia.  She is taking Protonix daily.  Orders placed this encounter:  No orders of the defined types were placed in this encounter.    Doreatha Massed, MD [facility] [phone]   I, Drue Second, am acting as a scribe for Dr. Payton Mccallum.  I, Doreatha Massed MD, have reviewed the above documentation for accuracy and completeness, and I agree with the above.

## 2020-10-24 NOTE — Patient Instructions (Signed)
[facility] at [facility] Discharge Instructions  You were seen today by Dr. Ellin Saba. He went over your recent results. Keep your appointment on May 9th to have your hiatal hernia evaluated. You may restart taking iron tablets if you are able to come off the Protonix. Dr. Ellin Saba will see you back in 6 months for labs and follow up.   Thank you for choosing [facility] at [facility] to provide your oncology and hematology care.  To afford each patient quality time with our provider, please arrive at least 15 minutes before your scheduled appointment time.   If you have a lab appointment with the Cancer Center please come in thru the Main Entrance and check in at the main information desk  You need to re-schedule your appointment should you arrive 10 or more minutes late.  We strive to give you quality time with our providers, and arriving late affects you and other patients whose appointments are after yours.  Also, if you no show three or more times for appointments you may be dismissed from the clinic at the providers discretion.     Again, thank you for choosing [facility].  Our hope is that these requests will decrease the amount of time that you wait before being seen by our physicians.       _____________________________________________________________  Should you have questions after your visit to [facility], please contact our office at [phone] between the hours of 8:00 a.m. and 4:30 p.m.  Voicemails left after 4:00 p.m. will not be returned until the following business day.  For prescription refill requests, have your pharmacy contact our office and allow 72 hours.    Cancer Center Support Programs:   > Cancer Support Group  2nd Tuesday of the month 1pm-2pm, Journey Room

## 2020-10-25 ENCOUNTER — Encounter: Payer: Self-pay | Admitting: Gastroenterology

## 2020-10-31 ENCOUNTER — Ambulatory Visit
Admission: RE | Admit: 2020-10-31 | Discharge: 2020-10-31 | Disposition: A | Discharge status: Home / Self Care | Payer: BC Managed Care – PPO | Source: Ambulatory Visit | Attending: General Surgery | Admitting: General Surgery

## 2020-10-31 DIAGNOSIS — K449 Diaphragmatic hernia without obstruction or gangrene: Secondary | ICD-10-CM

## 2020-11-24 ENCOUNTER — Encounter (HOSPITAL_COMMUNITY): Payer: Self-pay | Admitting: Hematology

## 2020-12-20 ENCOUNTER — Ambulatory Visit: Payer: BC Managed Care – PPO | Admitting: Gastroenterology

## 2021-02-06 ENCOUNTER — Encounter (HOSPITAL_COMMUNITY): Payer: Self-pay | Admitting: Hematology

## 2021-02-28 ENCOUNTER — Encounter: Payer: Self-pay | Admitting: Gastroenterology

## 2021-02-28 ENCOUNTER — Ambulatory Visit: Payer: BC Managed Care – PPO | Admitting: Gastroenterology

## 2021-03-22 ENCOUNTER — Other Ambulatory Visit: Payer: Self-pay

## 2021-03-22 ENCOUNTER — Ambulatory Visit: Payer: BC Managed Care – PPO

## 2021-03-22 ENCOUNTER — Encounter: Payer: Self-pay | Admitting: Family Medicine

## 2021-03-22 VITALS — BP 120/80

## 2021-03-22 DIAGNOSIS — J069 Acute upper respiratory infection, unspecified: Secondary | ICD-10-CM

## 2021-03-22 NOTE — Progress Notes (Signed)
Bp checked per patient request

## 2021-03-24 LAB — NOVEL CORONAVIRUS, NAA: SARS-CoV-2, NAA: NOT DETECTED

## 2021-03-24 LAB — SARS-COV-2, NAA 2 DAY TAT

## 2021-03-24 NOTE — Progress Notes (Signed)
COVID neg

## 2021-03-28 ENCOUNTER — Ambulatory Visit: Payer: BC Managed Care – PPO | Admitting: Nurse Practitioner

## 2021-04-18 ENCOUNTER — Inpatient Hospital Stay (HOSPITAL_COMMUNITY): Payer: BC Managed Care – PPO | Attending: Hematology

## 2021-04-18 ENCOUNTER — Other Ambulatory Visit: Payer: Self-pay

## 2021-04-18 DIAGNOSIS — D509 Iron deficiency anemia, unspecified: Secondary | ICD-10-CM | POA: Insufficient documentation

## 2021-04-18 DIAGNOSIS — D500 Iron deficiency anemia secondary to blood loss (chronic): Secondary | ICD-10-CM

## 2021-04-18 LAB — CBC WITH DIFFERENTIAL/PLATELET
Abs Immature Granulocytes: 0.01 10*3/uL (ref 0.00–0.07)
Basophils Absolute: 0 10*3/uL (ref 0.0–0.1)
Basophils Relative: 0 %
Eosinophils Absolute: 0.1 10*3/uL (ref 0.0–0.5)
Eosinophils Relative: 1 %
HCT: 36.8 % (ref 36.0–46.0)
Hemoglobin: 11.6 g/dL — ABNORMAL LOW (ref 12.0–15.0)
Immature Granulocytes: 0 %
Lymphocytes Relative: 27 %
Lymphs Abs: 2.1 10*3/uL (ref 0.7–4.0)
MCH: 27.2 pg (ref 26.0–34.0)
MCHC: 31.5 g/dL (ref 30.0–36.0)
MCV: 86.2 fL (ref 80.0–100.0)
Monocytes Absolute: 0.6 10*3/uL (ref 0.1–1.0)
Monocytes Relative: 7 %
Neutro Abs: 4.9 10*3/uL (ref 1.7–7.7)
Neutrophils Relative %: 65 %
Platelets: 231 10*3/uL (ref 150–400)
RBC: 4.27 MIL/uL (ref 3.87–5.11)
RDW: 13.4 % (ref 11.5–15.5)
WBC: 7.6 10*3/uL (ref 4.0–10.5)
nRBC: 0 % (ref 0.0–0.2)

## 2021-04-18 LAB — FERRITIN: Ferritin: 83 ng/mL (ref 11–307)

## 2021-04-18 LAB — IRON AND TIBC
Iron: 33 ug/dL (ref 28–170)
Saturation Ratios: 10 % — ABNORMAL LOW (ref 10.4–31.8)
TIBC: 337 ug/dL (ref 250–450)
UIBC: 304 ug/dL

## 2021-04-19 ENCOUNTER — Other Ambulatory Visit (HOSPITAL_COMMUNITY): Payer: BC Managed Care – PPO

## 2021-04-26 ENCOUNTER — Ambulatory Visit (HOSPITAL_COMMUNITY): Payer: BC Managed Care – PPO | Admitting: Physician Assistant

## 2021-04-26 NOTE — Progress Notes (Signed)
[facility] [address], [place] [address]   CLINIC:  Medical Oncology/Hematology  PCP:  Heather Roberts, NP [address]  [address] [place] [address] [phone]   REASON FOR VISIT:  Follow-up for iron deficiency anemia  PRIOR THERAPY: Oral iron tablets  CURRENT THERAPY: Intermittent Venofer, last on 08/08/2020  INTERVAL HISTORY:  [name] 40 y.o. female returns for routine follow-up of her iron deficiency anemia.  She was last seen by Dr. Ellin Saba on 10/24/2020  At today's visit, she reports feeling fair.  No recent hospitalizations, surgeries, or changes in baseline health status.  She reports that she initially felt great after getting IV iron in February 2022, but for the past 1 to 2 months she has been feeling increasingly fatigued and has had some lightheaded episodes and occasional palpitations.  No chest pain, dyspnea on exertion, pica, or syncopal episodes.  She reports that her menstrual periods last for about 7 days, with 4 days of moderate to heavy bleeding (ultra sized tampon lasts 2 to 3 hours, passes clots).  She denies any other abnormal blood loss such as epistaxis, hematemesis, hematochezia, or melena.  She reports that she decided not to undergo hiatal hernia surgery, but is only been taking Protonix about twice a week.  She has 70% energy and 100% appetite. She endorses that she is maintaining a stable weight.   REVIEW OF SYSTEMS:  Review of Systems  Constitutional:  Positive for fatigue. Negative for appetite change, chills, diaphoresis, fever and unexpected weight change.  HENT:   Negative for lump/mass and nosebleeds.   Eyes:  Negative for eye problems.  Respiratory:  Negative for cough, hemoptysis and shortness of breath.   Cardiovascular:  Positive for palpitations. Negative for chest pain and leg swelling.  Gastrointestinal:  Negative for abdominal pain, blood in stool, constipation, diarrhea, nausea and vomiting.   Genitourinary:  Negative for hematuria.   Skin: Negative.   Neurological:  Positive for headaches and light-headedness. Negative for dizziness.  Hematological:  Does not bruise/bleed easily.     PAST MEDICAL/SURGICAL HISTORY:  Past Medical History:  Diagnosis Date   Allergy    Anemia    Phreesia 12/03/2019   ANEMIA 08/09/2010   Qualifier: Diagnosis of  By: Lillia Mountain LPN, Brandi     Breast feeding status of mother 07/07/2015   Heartburn    High cholesterol    Multiparity 06/25/2015   Postpartum care following vaginal delivery (12/31) 06/25/2015   Status post tubal ligation at time of delivery, current hospitalization 06/27/2015   Vomiting blood    Past Surgical History:  Procedure Laterality Date   BIOPSY  03/30/2020   Procedure: BIOPSY;  Surgeon: Corbin Ade, MD;  [facility];  Service: Endoscopy;;   CHOLECYSTECTOMY N/A    Phreesia 05/23/2020   COLONOSCOPY N/A 03/30/2020   normal colon, normal TI.    cyst removed from right wrist     X3   ESOPHAGOGASTRODUODENOSCOPY  09/2005   Dr. Jena Gauss: very small hiatal hernia   ESOPHAGOGASTRODUODENOSCOPY  01/2010   Dr. Jena Gauss: small hiatal hernia   ESOPHAGOGASTRODUODENOSCOPY N/A 03/30/2020   mild erosive reflux esophagitis, medium-sized hiatal hernia s/p biopsy, normal duodenum. Schatzki's ring. Negative H.pylori.    laser surgery on cervix     to remove polyp   TUBAL LIGATION Bilateral 06/26/2015   Procedure: POST PARTUM TUBAL LIGATION;  Surgeon: Maxie Better, MD;  Location: [facility];  Service: Gynecology;  Laterality: Bilateral;  SOCIAL HISTORY:  Social History   Socioeconomic History   Marital status: Married    Spouse name: Yong Channel    Number of children: 2   Years of education: Not on file   Highest education level: Bachelor's degree (e.g., BA, AB, BS)  Occupational History   Occupation: Pharmacist, hospital    Comment: special education  Tobacco Use   Smoking status: Never   Smokeless tobacco: Never  Substance and Sexual  Activity   Alcohol use: Yes    Comment: socially   Drug use: No   Sexual activity: Yes    Birth control/protection: Surgical  Other Topics Concern   Not on file  Social History Narrative   Lives with Hunter-husband 10 years and children: Aspen 6 and Rhyland 4      Enjoys: cooking, teaching      Diet: eats all food groups- no pork   Caffeine: 1 cup of coffee, tea daily   Water: 4-5 daily         Wears seat belt   Does not use phone while driving   Oceanographer at home   [employer]             Social Determinants of Health   Financial Resource Strain: Not on file  Food Insecurity: Not on file  Transportation Needs: Not on file  Physical Activity: Not on file  Stress: Not on file  Social Connections: Not on file  Intimate Partner Violence: Not on file    FAMILY HISTORY:  Family History  Problem Relation Age of Onset   Asthma Mother    Allergies Mother    Cancer Maternal Aunt        facial   Cancer Maternal Grandmother    Colon cancer Neg Hx    Inflammatory bowel disease Neg Hx    Celiac disease Neg Hx     CURRENT MEDICATIONS:  Outpatient Encounter Medications as of 04/27/2021  Medication Sig   acetaminophen (TYLENOL) 500 MG tablet Take 500-1,000 mg by mouth every 6 (six) hours as needed (pain.).   albuterol (PROVENTIL) (2.5 MG/3ML) 0.083% nebulizer solution Take 3 mLs (2.5 mg total) by nebulization every 6 (six) hours as needed for wheezing or shortness of breath.   albuterol (VENTOLIN HFA) 108 (90 Base) MCG/ACT inhaler Inhale 2 puffs into the lungs every 6 (six) hours as needed for wheezing or shortness of breath.   benzonatate (TESSALON) 100 MG capsule Take 1 capsule (100 mg total) by mouth 2 (two) times daily as needed for cough.   Multiple Vitamin (MULTIVITAMIN) tablet Take 1 tablet by mouth daily.   pantoprazole (PROTONIX) 40 MG tablet Take 40 mg by mouth daily.   Vitamin D-Vitamin K (K2 PLUS D3 PO) Take by mouth daily.   No facility-administered  encounter medications on file as of 04/27/2021.    ALLERGIES:  Allergies  Allergen Reactions   Penicillins Diarrhea and Nausea And Vomiting    Has patient had a PCN reaction causing immediate rash, facial/tongue/throat swelling, SOB or lightheadedness with hypotension: No Has patient had a PCN reaction causing severe rash involving mucus membranes or skin necrosis: No Has patient had a PCN reaction that required hospitalization No Has patient had a PCN reaction occurring within the last 10 years: Yes If all of the above answers are "NO", then may proceed with Cephalosporin use.      PHYSICAL EXAM:  ECOG PERFORMANCE STATUS: 1 - Symptomatic but completely ambulatory  There were no vitals filed for this visit. There  were no vitals filed for this visit. Physical Exam Constitutional:      Appearance: Normal appearance. She is obese.  HENT:     Head: Normocephalic and atraumatic.     Mouth/Throat:     Mouth: Mucous membranes are moist.  Eyes:     Extraocular Movements: Extraocular movements intact.     Pupils: Pupils are equal, round, and reactive to light.  Cardiovascular:     Rate and Rhythm: Normal rate and regular rhythm.     Pulses: Normal pulses.     Heart sounds: Normal heart sounds.  Pulmonary:     Effort: Pulmonary effort is normal.     Breath sounds: Normal breath sounds.  Abdominal:     General: Bowel sounds are normal.     Palpations: Abdomen is soft.     Tenderness: There is no abdominal tenderness.  Musculoskeletal:        General: No swelling.     Right lower leg: No edema.     Left lower leg: No edema.  Lymphadenopathy:     Cervical: No cervical adenopathy.  Skin:    General: Skin is warm and dry.  Neurological:     General: No focal deficit present.     Mental Status: She is alert and oriented to person, place, and time.  Psychiatric:        Mood and Affect: Mood normal.        Behavior: Behavior normal.     LABORATORY DATA:  I have reviewed the  labs as listed.  CBC    Component Value Date/Time   WBC 7.6 04/18/2021 1541   RBC 4.27 04/18/2021 1541   HGB 11.6 (L) 04/18/2021 1541   HCT 36.8 04/18/2021 1541   PLT 231 04/18/2021 1541   MCV 86.2 04/18/2021 1541   MCH 27.2 04/18/2021 1541   MCHC 31.5 04/18/2021 1541   RDW 13.4 04/18/2021 1541   LYMPHSABS 2.1 04/18/2021 1541   MONOABS 0.6 04/18/2021 1541   EOSABS 0.1 04/18/2021 1541   BASOSABS 0.0 04/18/2021 1541   CMP Latest Ref Rng & Units 02/26/2020 01/30/2011 07/20/2009  Glucose 65 - 99 mg/dL 77 84 84  BUN 7 - 25 mg/dL 11 8 7   Creatinine 0.50 - 1.10 mg/dL 0.90 0.90 0.75  Sodium 135 - 146 mmol/L 139 138 140  Potassium 3.5 - 5.3 mmol/L 4.3 4.6 3.9  Chloride 98 - 110 mmol/L 106 105 108  CO2 20 - 32 mmol/L 26 24 26   Calcium 8.6 - 10.2 mg/dL 9.4 9.5 9.0  Total Protein 6.1 - 8.1 g/dL 7.2 - -  Total Bilirubin 0.2 - 1.2 mg/dL 0.4 - -  AST 10 - 30 U/L 17 - -  ALT 6 - 29 U/L 14 - -    DIAGNOSTIC IMAGING:  I have independently reviewed the relevant imaging and discussed with the patient.  ASSESSMENT & PLAN: 1.  Iron deficiency anemia: - Most likely etiology is normal blood loss from menses as well as malabsorption in the setting of chronic PPI use - Initial labs on 06/10/2020 with hemoglobin 11 and ferritin of 25. - Additional work-up from 07/21/2020 showed normal B12, methylmalonic acid, copper and folic acid levels.  SPEP was also negative. - She took oral iron for over 1 year without improvement in her levels - No history of transfusion. - EGD on 03/30/2020 with mild erosive reflux esophagitis, medium sized hiatal hernia.  Normal duodenum. - Colonoscopy on 03/30/2020 was normal.  Normal distal ileum. - She  received 5 infusions of IV Venofer (total 1000 mg), last on 08/08/2020 - She reported improvement in her energy levels after IV iron infusions - Complains of feeling fatigued with lightheadedness and palpitations.   - She has moderate to heavy menses, 7 out of every 28 days, 4  of which are moderately heavy..  No bleeding per rectum reported.   - Most recent labs (04/18/2021): Hgb 11.6, ferritin 83, iron saturation 10% - PLAN: Recommend additional IV iron with Venofer 300 mg x3 - Since she has cut back on Protonix to only twice weekly, recommend that she try taking oral iron supplement daily again. - Repeat labs and RTC in 6 months.    2.  GERD: - This is likely from hiatal hernia.  She is taking Protonix twice weekly.  3.  Social/family history: - She works as a Chief Technology Officer. - Maternal first cousin had to have blood transfusions.  Maternal aunt had nasopharyngeal cancer.    PLAN SUMMARY & DISPOSITION: -IV Venofer (300 mg x 3) - Repeat labs and RTC in 6 months  All questions were answered. The patient knows to call the clinic with any problems, questions or concerns.  Medical decision making: Low  Time spent on visit: I spent 20 minutes counseling the patient face to face. The total time spent in the appointment was 30 minutes and more than 50% was on counseling.   Harriett Rush, PA-C  04/27/2021 3:46 PM

## 2021-04-27 ENCOUNTER — Other Ambulatory Visit: Payer: Self-pay

## 2021-04-27 ENCOUNTER — Inpatient Hospital Stay (HOSPITAL_COMMUNITY): Payer: BC Managed Care – PPO | Attending: Physician Assistant | Admitting: Physician Assistant

## 2021-04-27 VITALS — BP 126/77 | HR 70 | Temp 97.6°F | Resp 18 | Wt 205.0 lb

## 2021-04-27 DIAGNOSIS — D509 Iron deficiency anemia, unspecified: Secondary | ICD-10-CM | POA: Diagnosis not present

## 2021-04-27 DIAGNOSIS — D500 Iron deficiency anemia secondary to blood loss (chronic): Secondary | ICD-10-CM | POA: Diagnosis not present

## 2021-04-27 NOTE — Patient Instructions (Signed)
[facility] at [facility] Discharge Instructions  You were seen today by Rojelio Brenner PA-C for your iron deficiency anemia.  As we discussed, the cause of your iron deficiency anemia is likely a combination of blood loss from your menstrual periods as well as poor absorption of iron due to antacid use.  Due to your symptomatic iron deficiency anemia, I recommend iron repletion with IV iron (Venofer) x1000 mg in divided doses.  You should also start taking iron tablet again, since you are not taking Protonix as often.  Take your iron tablet daily along with vitamin C and a glass of orange juice to help your body absorb it better.  If you have any constipation as a result of taking iron supplement, you can take Colace 100 mg daily (over-the-counter stool softener).  We will recheck your labs and see you for follow-up visit in 6 months, or sooner if needed based on your symptoms.  LABS: Return in 6 months for repeat labs  TREATMENT: - IV iron x1000 mg in divided doses - Restart iron pill (ferrous sulfate) daily  FOLLOW-UP APPOINTMENT: Office visit in 6 months, after labs   Thank you for choosing [facility] at [facility] to provide your oncology and hematology care.  To afford each patient quality time with our provider, please arrive at least 15 minutes before your scheduled appointment time.   If you have a lab appointment with the Cancer Center please come in thru the Main Entrance and check in at the main information desk.  You need to re-schedule your appointment should you arrive 10 or more minutes late.  We strive to give you quality time with our providers, and arriving late affects you and other patients whose appointments are after yours.  Also, if you no show three or more times for appointments you may be dismissed from the clinic at the providers discretion.     Again, thank you for choosing [facility].  Our hope is  that these requests will decrease the amount of time that you wait before being seen by our physicians.       _____________________________________________________________  Should you have questions after your visit to [facility], please contact our office at [phone] and follow the prompts.  Our office hours are 8:00 a.m. and 4:30 p.m. Monday - Friday.  Please note that voicemails left after 4:00 p.m. may not be returned until the following business day.  We are closed weekends and major holidays.  You do have access to a nurse 24-7, just call the main number to the clinic [phone] and do not press any options, hold on the line and a nurse will answer the phone.    For prescription refill requests, have your pharmacy contact our office and allow 72 hours.    Due to Covid, you will need to wear a mask upon entering the hospital. If you do not have a mask, a mask will be given to you at the Main Entrance upon arrival. For doctor visits, patients may have 1 support person age 49 or older with them. For treatment visits, patients can not have anyone with them due to social distancing guidelines and our immunocompromised population.

## 2021-05-03 ENCOUNTER — Other Ambulatory Visit: Payer: Self-pay

## 2021-05-03 ENCOUNTER — Inpatient Hospital Stay (HOSPITAL_COMMUNITY): Payer: BC Managed Care – PPO

## 2021-05-03 VITALS — BP 114/73 | HR 78 | Temp 97.0°F | Resp 18

## 2021-05-03 DIAGNOSIS — D509 Iron deficiency anemia, unspecified: Secondary | ICD-10-CM

## 2021-05-03 MED ORDER — SODIUM CHLORIDE 0.9 % IV SOLN
300.0000 mg | Freq: Once | INTRAVENOUS | Status: AC
  Administered 2021-05-03: 300 mg via INTRAVENOUS
  Filled 2021-05-03: qty 300

## 2021-05-03 MED ORDER — LORATADINE 10 MG PO TABS
10.0000 mg | ORAL_TABLET | Freq: Once | ORAL | Status: AC
  Administered 2021-05-03: 10 mg via ORAL
  Filled 2021-05-03: qty 1

## 2021-05-03 MED ORDER — ACETAMINOPHEN 325 MG PO TABS
650.0000 mg | ORAL_TABLET | Freq: Once | ORAL | Status: AC
  Administered 2021-05-03: 650 mg via ORAL
  Filled 2021-05-03: qty 2

## 2021-05-03 MED ORDER — SODIUM CHLORIDE 0.9 % IV SOLN: Freq: Once | INTRAVENOUS | Status: AC

## 2021-05-03 NOTE — Progress Notes (Signed)
Pt presents today for Venofer IV iron per provider's order. Vital signs stable and pt voiced no new complaints at this time.  Peripheral IV started with good blood return pre and post infusion.  Venofer IV iron infusion given today per MD orders. Tolerated infusion without adverse affects. Vital signs stable. No complaints at this time. Discharged from clinic ambulatory in stable condition. Alert and oriented x 3. F/U with [facility] as scheduled.

## 2021-05-03 NOTE — Patient Instructions (Signed)
[facility]  Discharge Instructions: Thank you for choosing [facility] to provide your oncology and hematology care.  If you have a lab appointment with the Cancer Center, please come in thru the Main Entrance and check in at the main information desk.  Wear comfortable clothing and clothing appropriate for easy access to any Portacath or PICC line.   We strive to give you quality time with your provider. You may need to reschedule your appointment if you arrive late (15 or more minutes).  Arriving late affects you and other patients whose appointments are after yours.  Also, if you miss three or more appointments without notifying the office, you may be dismissed from the clinic at the provider's discretion.      For prescription refill requests, have your pharmacy contact our office and allow 72 hours for refills to be completed.    Today you received Venofer Iv iron infusion    BELOW ARE SYMPTOMS THAT SHOULD BE REPORTED IMMEDIATELY: *FEVER GREATER THAN 100.4 F (38 C) OR HIGHER *CHILLS OR SWEATING *NAUSEA AND VOMITING THAT IS NOT CONTROLLED WITH YOUR NAUSEA MEDICATION *UNUSUAL SHORTNESS OF BREATH *UNUSUAL BRUISING OR BLEEDING *URINARY PROBLEMS (pain or burning when urinating, or frequent urination) *BOWEL PROBLEMS (unusual diarrhea, constipation, pain near the anus) TENDERNESS IN MOUTH AND THROAT WITH OR WITHOUT PRESENCE OF ULCERS (sore throat, sores in mouth, or a toothache) UNUSUAL RASH, SWELLING OR PAIN  UNUSUAL VAGINAL DISCHARGE OR ITCHING   Items with * indicate a potential emergency and should be followed up as soon as possible or go to the Emergency Department if any problems should occur.  Please show the CHEMOTHERAPY ALERT CARD or IMMUNOTHERAPY ALERT CARD at check-in to the Emergency Department and triage nurse.  Should you have questions after your visit or need to cancel or reschedule your appointment, please contact [facility]  [phone]  and follow the prompts.  Office hours are 8:00 a.m. to 4:30 p.m. Monday - Friday. Please note that voicemails left after 4:00 p.m. may not be returned until the following business day.  We are closed weekends and major holidays. You have access to a nurse at all times for urgent questions. Please call the main number to the clinic [phone] and follow the prompts.  For any non-urgent questions, you may also contact your provider using MyChart. We now offer e-Visits for anyone 73 and older to request care online for non-urgent symptoms. For details visit [email or web address].   Also download the MyChart app! Go to the app store, search "MyChart", open the app, select [place], and log in with your MyChart username and password.  Due to Covid, a mask is required upon entering the hospital/clinic. If you do not have a mask, one will be given to you upon arrival. For doctor visits, patients may have 1 support person aged 25 or older with them. For treatment visits, patients cannot have anyone with them due to current Covid guidelines and our immunocompromised population.

## 2021-05-04 ENCOUNTER — Ambulatory Visit: Payer: BC Managed Care – PPO | Admitting: Nurse Practitioner

## 2021-05-12 ENCOUNTER — Inpatient Hospital Stay (HOSPITAL_COMMUNITY): Payer: BC Managed Care – PPO

## 2021-05-12 ENCOUNTER — Other Ambulatory Visit: Payer: Self-pay

## 2021-05-12 VITALS — BP 113/77 | HR 77 | Temp 98.1°F | Resp 18

## 2021-05-12 DIAGNOSIS — D509 Iron deficiency anemia, unspecified: Secondary | ICD-10-CM | POA: Diagnosis not present

## 2021-05-12 MED ORDER — SODIUM CHLORIDE 0.9 % IV SOLN
300.0000 mg | Freq: Once | INTRAVENOUS | Status: AC
  Administered 2021-05-12: 300 mg via INTRAVENOUS
  Filled 2021-05-12: qty 300

## 2021-05-12 MED ORDER — SODIUM CHLORIDE 0.9 % IV SOLN
Freq: Once | INTRAVENOUS | Status: AC
Start: 2021-05-12 — End: 2021-05-12

## 2021-05-12 NOTE — Patient Instructions (Signed)
[place] [facility]  Discharge Instructions: Thank you for choosing [facility] to provide your oncology and hematology care.  If you have a lab appointment with the Cancer Center, please come in thru the Main Entrance and check in at the main information desk.  Wear comfortable clothing and clothing appropriate for easy access to any Portacath or PICC line.   We strive to give you quality time with your provider. You may need to reschedule your appointment if you arrive late (15 or more minutes).  Arriving late affects you and other patients whose appointments are after yours.  Also, if you miss three or more appointments without notifying the office, you may be dismissed from the clinic at the provider's discretion.      For prescription refill requests, have your pharmacy contact our office and allow 72 hours for refills to be completed.        To help prevent nausea and vomiting after your treatment, we encourage you to take your nausea medication as directed.  BELOW ARE SYMPTOMS THAT SHOULD BE REPORTED IMMEDIATELY: *FEVER GREATER THAN 100.4 F (38 C) OR HIGHER *CHILLS OR SWEATING *NAUSEA AND VOMITING THAT IS NOT CONTROLLED WITH YOUR NAUSEA MEDICATION *UNUSUAL SHORTNESS OF BREATH *UNUSUAL BRUISING OR BLEEDING *URINARY PROBLEMS (pain or burning when urinating, or frequent urination) *BOWEL PROBLEMS (unusual diarrhea, constipation, pain near the anus) TENDERNESS IN MOUTH AND THROAT WITH OR WITHOUT PRESENCE OF ULCERS (sore throat, sores in mouth, or a toothache) UNUSUAL RASH, SWELLING OR PAIN  UNUSUAL VAGINAL DISCHARGE OR ITCHING   Items with * indicate a potential emergency and should be followed up as soon as possible or go to the Emergency Department if any problems should occur.  Please show the CHEMOTHERAPY ALERT CARD or IMMUNOTHERAPY ALERT CARD at check-in to the Emergency Department and triage nurse.  Should you have questions after your visit or need to cancel  or reschedule your appointment, please contact [facility] [phone]  and follow the prompts.  Office hours are 8:00 a.m. to 4:30 p.m. Monday - Friday. Please note that voicemails left after 4:00 p.m. may not be returned until the following business day.  We are closed weekends and major holidays. You have access to a nurse at all times for urgent questions. Please call the main number to the clinic [phone] and follow the prompts.  For any non-urgent questions, you may also contact your provider using MyChart. We now offer e-Visits for anyone 18 and older to request care online for non-urgent symptoms. For details visit [email or web address].   Also download the MyChart app! Go to the app store, search "MyChart", open the app, select [place], and log in with your MyChart username and password.  Due to Covid, a mask is required upon entering the hospital/clinic. If you do not have a mask, one will be given to you upon arrival. For doctor visits, patients may have 1 support person aged 18 or older with them. For treatment visits, patients cannot have anyone with them due to current Covid guidelines and our immunocompromised population.  

## 2021-05-12 NOTE — Progress Notes (Signed)
Patient presents today for iron infusion.  Patient is in satisfactory condition with no complaints voiced.  Vital signs are stable.  Patient took pre-medications at home prior to visit at 1000.  We will proceed with treatment per MD orders.   Patient tolerated treatment well with no complaints voiced.  Patient left ambulatory in stable condition.  Vital signs stable at discharge.  Follow up as scheduled.

## 2021-05-22 ENCOUNTER — Other Ambulatory Visit: Payer: Self-pay

## 2021-05-22 ENCOUNTER — Inpatient Hospital Stay (HOSPITAL_COMMUNITY): Payer: BC Managed Care – PPO

## 2021-05-22 VITALS — BP 121/81 | HR 66 | Temp 97.1°F | Resp 18

## 2021-05-22 DIAGNOSIS — D509 Iron deficiency anemia, unspecified: Secondary | ICD-10-CM | POA: Diagnosis not present

## 2021-05-22 MED ORDER — SODIUM CHLORIDE 0.9 % IV SOLN: Freq: Once | INTRAVENOUS | Status: AC

## 2021-05-22 MED ORDER — SODIUM CHLORIDE 0.9 % IV SOLN
300.0000 mg | Freq: Once | INTRAVENOUS | Status: AC
  Administered 2021-05-22: 09:00:00 300 mg via INTRAVENOUS
  Filled 2021-05-22: qty 15

## 2021-05-22 NOTE — Patient Instructions (Signed)
[facility] [facility]  Discharge Instructions: Thank you for choosing [facility] to provide your oncology and hematology care.  If you have a lab appointment with the Cancer Center, please come in thru the Main Entrance and check in at the main information desk.  Wear comfortable clothing and clothing appropriate for easy access to any Portacath or PICC line.   We strive to give you quality time with your provider. You may need to reschedule your appointment if you arrive late (15 or more minutes).  Arriving late affects you and other patients whose appointments are after yours.  Also, if you miss three or more appointments without notifying the office, you may be dismissed from the clinic at the provider's discretion.      For prescription refill requests, have your pharmacy contact our office and allow 72 hours for refills to be completed.        To help prevent nausea and vomiting after your treatment, we encourage you to take your nausea medication as directed.  BELOW ARE SYMPTOMS THAT SHOULD BE REPORTED IMMEDIATELY: *FEVER GREATER THAN 100.4 F (38 C) OR HIGHER *CHILLS OR SWEATING *NAUSEA AND VOMITING THAT IS NOT CONTROLLED WITH YOUR NAUSEA MEDICATION *UNUSUAL SHORTNESS OF BREATH *UNUSUAL BRUISING OR BLEEDING *URINARY PROBLEMS (pain or burning when urinating, or frequent urination) *BOWEL PROBLEMS (unusual diarrhea, constipation, pain near the anus) TENDERNESS IN MOUTH AND THROAT WITH OR WITHOUT PRESENCE OF ULCERS (sore throat, sores in mouth, or a toothache) UNUSUAL RASH, SWELLING OR PAIN  UNUSUAL VAGINAL DISCHARGE OR ITCHING   Items with * indicate a potential emergency and should be followed up as soon as possible or go to the Emergency Department if any problems should occur.  Please show the CHEMOTHERAPY ALERT CARD or IMMUNOTHERAPY ALERT CARD at check-in to the Emergency Department and triage nurse.  Should you have questions after your visit or need to cancel  or reschedule your appointment, please contact [facility] [phone]  and follow the prompts.  Office hours are 8:00 a.m. to 4:30 p.m. Monday - Friday. Please note that voicemails left after 4:00 p.m. may not be returned until the following business day.  We are closed weekends and major holidays. You have access to a nurse at all times for urgent questions. Please call the main number to the clinic [phone] and follow the prompts.  For any non-urgent questions, you may also contact your provider using MyChart. We now offer e-Visits for anyone 18 and older to request care online for non-urgent symptoms. For details visit [email or web address].   Also download the MyChart app! Go to the app store, search "MyChart", open the app, select [place], and log in with your MyChart username and password.  Due to Covid, a mask is required upon entering the hospital/clinic. If you do not have a mask, one will be given to you upon arrival. For doctor visits, patients may have 1 support person aged 18 or older with them. For treatment visits, patients cannot have anyone with them due to current Covid guidelines and our immunocompromised population.  

## 2021-05-22 NOTE — Progress Notes (Signed)
Patient presents today for iron infusion.  Patient is in satisfactory condition with no complaints voiced.  Vital signs are stable.  Pre-medications were taken at home prior to visit at 0730.  We will proceed with treatment per MD orders.   Patient tolerated treatment well with no complaints voiced.  Patient left ambulatory in stable condition.  Vital signs stable at discharge.  Follow up as scheduled.

## 2021-06-05 ENCOUNTER — Ambulatory Visit: Payer: BC Managed Care – PPO | Admitting: Nurse Practitioner

## 2021-06-14 ENCOUNTER — Other Ambulatory Visit: Payer: Self-pay

## 2021-06-14 ENCOUNTER — Ambulatory Visit: Payer: BC Managed Care – PPO | Admitting: Nurse Practitioner

## 2021-06-14 ENCOUNTER — Encounter: Payer: Self-pay | Admitting: Nurse Practitioner

## 2021-06-14 VITALS — BP 128/81 | HR 83 | Ht 66.5 in | Wt 202.0 lb

## 2021-06-14 DIAGNOSIS — Z0000 Encounter for general adult medical examination without abnormal findings: Secondary | ICD-10-CM

## 2021-06-14 DIAGNOSIS — Z23 Encounter for immunization: Secondary | ICD-10-CM

## 2021-06-14 DIAGNOSIS — R42 Dizziness and giddiness: Secondary | ICD-10-CM

## 2021-06-14 DIAGNOSIS — Z0001 Encounter for general adult medical examination with abnormal findings: Secondary | ICD-10-CM | POA: Insufficient documentation

## 2021-06-14 DIAGNOSIS — G930 Cerebral cysts: Secondary | ICD-10-CM

## 2021-06-14 NOTE — Assessment & Plan Note (Signed)
Gets iron infusion for anemia, feelings of lightheadedness might be from her anemia. Denies CP, palpitations.

## 2021-06-14 NOTE — Assessment & Plan Note (Signed)
Patient educated on CDC recommendation for the vaccine. Verbal consent was obtained from the patient, vaccine administered by nurse, no sign of adverse reactions noted at this time. Patient education on arm soreness and use of tylenol or ibuprofen (if safe) for this patient  was discussed. Patient educated on the signs and symptoms of adverse effect and advise to contact the office if they occur °

## 2021-06-14 NOTE — Patient Instructions (Addendum)
Pleas get your labs done today Get your COVID vaccine booster at your pharmacy.    It is important that you exercise regularly at least 30 minutes 5 times a week.  Think about what you will eat, plan ahead. Choose " clean, green, fresh or frozen" over canned, processed or packaged foods which are more sugary, salty and fatty. 70 to 75% of food eaten should be vegetables and fruit. Three meals at set times with snacks allowed between meals, but they must be fruit or vegetables. Aim to eat over a 12 hour period , example 7 am to 7 pm, and STOP after  your last meal of the day. Drink water,generally about 64 ounces per day, no other drink is as healthy. Fruit juice is best enjoyed in a healthy way, by EATING the fruit.  Thanks for choosing [facility], we consider it a privelige to serve you.

## 2021-06-14 NOTE — Assessment & Plan Note (Signed)
Annual exam as documented.  ?Counseling done include healthy lifestyle involving committing to 150 minutes of exercise per week, heart healthy diet, and attaining healthy weight. The importance of adequate sleep also discussed.  ?Regular use of seat belt and home safety were also discussed . ?Changes in health habits are decided on by patient with goals and time frames set for achieving them. ?Immunization and cancer screening  needs are specifically addressed at this visit.   ?

## 2021-06-14 NOTE — Progress Notes (Signed)
Established Patient Office Visit  Subjective:  Patient ID: Martha [name], female    DOB: 1980-07-16  Age: 40 y.o. MRN: [id number]  CC:  Chief Complaint  Patient presents with   Follow-up    HPI Precilla SAFIYYAH [name] presents for annual exam. Last PAP was Dec 2021, exam was normal per pt.   Needs TDAP vaccine, Flu vaccine, COVID booster.  Got Iron infusion twice goes to the cancer center, next appointment in March, 2023, sometimes feels light headed.   Past medical history and family medical history updated.   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   Past Medical History:  Diagnosis Date   Allergy    Anemia    Phreesia 12/03/2019   ANEMIA 08/09/2010   Qualifier: Diagnosis of  By: Lillia Mountain LPN, Brandi     Asthma    Breast feeding status of mother 07/07/2015   Heartburn    High cholesterol    Multiparity 06/25/2015   Postpartum care following vaginal delivery (12/31) 06/25/2015   Status post tubal ligation at time of delivery, current hospitalization 06/27/2015   Vomiting blood     Past Surgical History:  Procedure Laterality Date   BIOPSY  03/30/2020   Procedure: BIOPSY;  Surgeon: Corbin Ade, MD;  Location: [facility];  Service: Endoscopy;;   COLONOSCOPY N/A 03/30/2020   normal colon, normal TI.    cyst removed from right wrist     X3   ESOPHAGOGASTRODUODENOSCOPY  09/2005   Dr. Jena Gauss: very small hiatal hernia   ESOPHAGOGASTRODUODENOSCOPY  01/2010   Dr. Jena Gauss: small hiatal hernia   ESOPHAGOGASTRODUODENOSCOPY N/A 03/30/2020   mild erosive reflux esophagitis, medium-sized hiatal hernia s/p biopsy, normal duodenum. Schatzki's ring. Negative H.pylori.    laser surgery on cervix     to remove polyp   TUBAL LIGATION Bilateral 06/26/2015   Procedure: POST PARTUM TUBAL LIGATION;  Surgeon: Maxie Better, MD;  Location: [facility];  Service: Gynecology;  Laterality: Bilateral;    Family History   Problem Relation Age of Onset   Asthma Mother    Allergies Mother    Diabetes Mother        prediabetes   Myasthenia gravis Father    Bladder Cancer Father        diagnosed at 85   Hyperlipidemia Father    Migraines Sister    Cancer Maternal Aunt        facial   Diabetes Paternal Uncle    Cancer Maternal Grandmother        stomach   Heart disease Paternal Grandmother    Heart disease Paternal Grandfather    Colon cancer Neg Hx    Inflammatory bowel disease Neg Hx    Celiac disease Neg Hx    Breast cancer Neg Hx    Lung cancer Neg Hx     Social History   Socioeconomic History   Marital status: Married    Spouse name: [name]    Number of children: 2   Years of education: Not on file   Highest education level: Bachelor's degree (e.g., BA, AB, BS)  Occupational History   Occupation: Runner, broadcasting/film/video    Comment: special education  Tobacco Use   Smoking status: Never   Smokeless tobacco: Never  Substance and Sexual Activity   Alcohol use: Yes    Comment: socially   Drug use: No   Sexual activity: Yes  Birth control/protection: Surgical  Other Topics Concern   Not on file  Social History Narrative   Lives with Hunter-husband 11 years and children: Aspen 7 and Rhyland 5      Enjoys: cooking, teaching      Diet: eats all food groups- no pork   Caffeine: 1 cup of coffee, tea daily   Water: 4-5 daily         Wears seat belt   Does not use phone while driving   Psychologist, sport and exercise at home   [employer]             Social Determinants of Health   Financial Resource Strain: Not on file  Food Insecurity: Not on file  Transportation Needs: Not on file  Physical Activity: Not on file  Stress: Not on file  Social Connections: Not on file  Intimate Partner Violence: Not on file    Outpatient Medications Prior to Visit  Medication Sig Dispense Refill   acetaminophen (TYLENOL) 500 MG tablet Take 500-1,000 mg by mouth every 6 (six) hours as needed (pain.).      albuterol (PROVENTIL) (2.5 MG/3ML) 0.083% nebulizer solution Take 3 mLs (2.5 mg total) by nebulization every 6 (six) hours as needed for wheezing or shortness of breath. 150 mL 1   albuterol (VENTOLIN HFA) 108 (90 Base) MCG/ACT inhaler Inhale 2 puffs into the lungs every 6 (six) hours as needed for wheezing or shortness of breath. 8 g 0   Multiple Vitamin (MULTIVITAMIN) tablet Take 1 tablet by mouth daily.     pantoprazole (PROTONIX) 40 MG tablet Take 40 mg by mouth daily.     Vitamin D-Vitamin K (K2 PLUS D3 PO) Take by mouth daily.     benzonatate (TESSALON) 100 MG capsule Take 1 capsule (100 mg total) by mouth 2 (two) times daily as needed for cough. (Patient not taking: Reported on 06/14/2021) 20 capsule 0   No facility-administered medications prior to visit.    Allergies  Allergen Reactions   Penicillins Diarrhea and Nausea And Vomiting    Has patient had a PCN reaction causing immediate rash, facial/tongue/throat swelling, SOB or lightheadedness with hypotension: No Has patient had a PCN reaction causing severe rash involving mucus membranes or skin necrosis: No Has patient had a PCN reaction that required hospitalization No Has patient had a PCN reaction occurring within the last 10 years: Yes If all of the above answers are "NO", then may proceed with Cephalosporin use.     ROS Review of Systems  Constitutional: Negative.   HENT: Negative.    Eyes: Negative.   Respiratory: Negative.    Cardiovascular: Negative.   Gastrointestinal: Negative.   Endocrine: Negative.   Genitourinary: Negative.   Musculoskeletal: Negative.   Skin: Negative.   Allergic/Immunologic: Negative.   Neurological:  Positive for light-headedness.  Hematological:  Bruises/bleeds easily.  Psychiatric/Behavioral: Negative.       Objective:    Physical Exam Vitals and nursing note reviewed. Exam conducted with a chaperone present.  Constitutional:      General: She is not in acute distress.     Appearance: Normal appearance. She is obese. She is not ill-appearing, toxic-appearing or diaphoretic.  HENT:     Head: Normocephalic and atraumatic.     Right Ear: Tympanic membrane, ear canal and external ear normal. There is no impacted cerumen.     Left Ear: Tympanic membrane, ear canal and external ear normal. There is no impacted cerumen.     Nose: No congestion  or rhinorrhea.     Mouth/Throat:     Mouth: Mucous membranes are moist.     Pharynx: No oropharyngeal exudate or posterior oropharyngeal erythema.  Eyes:     General:        Right eye: No discharge.        Left eye: No discharge.     Extraocular Movements: Extraocular movements intact.     Conjunctiva/sclera: Conjunctivae normal.  Neck:     Vascular: No carotid bruit.  Cardiovascular:     Rate and Rhythm: Normal rate and regular rhythm.     Pulses: Normal pulses.     Heart sounds: Normal heart sounds. No murmur heard.   No friction rub. No gallop.  Pulmonary:     Effort: No respiratory distress.     Breath sounds: No stridor. No wheezing, rhonchi or rales.  Chest:     Chest wall: No mass, lacerations, deformity, swelling, tenderness or edema.  Breasts:    Tanner Score is 5.     Right: Normal. No swelling, bleeding, inverted nipple, mass, nipple discharge or skin change.     Left: Normal. No swelling, bleeding, inverted nipple, mass, nipple discharge or skin change.  Abdominal:     General: Abdomen is flat. There is no distension.     Palpations: Abdomen is soft. There is no mass.     Tenderness: There is no abdominal tenderness. There is no right CVA tenderness, left CVA tenderness, guarding or rebound.     Hernia: No hernia is present.  Musculoskeletal:        General: No swelling, tenderness, deformity or signs of injury.     Cervical back: Normal range of motion and neck supple. No rigidity or tenderness.     Right lower leg: No edema.     Left lower leg: No edema.  Lymphadenopathy:     Cervical: No cervical  adenopathy.     Upper Body:     Right upper body: No supraclavicular, axillary or pectoral adenopathy.     Left upper body: No supraclavicular, axillary or pectoral adenopathy.  Skin:    General: Skin is warm and dry.     Capillary Refill: Capillary refill takes 2 to 3 seconds.     Coloration: Skin is not jaundiced or pale.     Findings: No bruising, erythema, lesion or rash.  Neurological:     Mental Status: She is alert and oriented to person, place, and time. Mental status is at baseline.     Cranial Nerves: No cranial nerve deficit.     Sensory: No sensory deficit.     Motor: No weakness.     Coordination: Coordination normal.     Gait: Gait normal.  Psychiatric:        Mood and Affect: Mood normal.        Behavior: Behavior normal.        Thought Content: Thought content normal.        Judgment: Judgment normal.    BP 128/81 (BP Location: Right Arm, Patient Position: Sitting, Cuff Size: Normal)    Pulse 83    Ht 5' 6.5" (1.689 m)    Wt 202 lb (91.6 kg)    LMP 05/24/2021 (Exact Date)    SpO2 98%    BMI 32.12 kg/m  Wt Readings from Last 3 Encounters:  06/14/21 202 lb (91.6 kg)  04/27/21 205 lb 0.4 oz (93 kg)  10/24/20 209 lb 6.4 oz (95 kg)     Health  Maintenance Due  Topic Date Due   Pneumococcal Vaccine 90-51 Years old (1 - PCV) Never done   Hepatitis C Screening  Never done   PAP SMEAR-Modifier  09/11/2015   COVID-19 Vaccine (3 - Booster for Pfizer series) 10/19/2019   TETANUS/TDAP  01/29/2021    There are no preventive care reminders to display for this patient.  Lab Results  Component Value Date   TSH 1.574 07/21/2020   Lab Results  Component Value Date   WBC 7.6 04/18/2021   HGB 11.6 (L) 04/18/2021   HCT 36.8 04/18/2021   MCV 86.2 04/18/2021   PLT 231 04/18/2021   Lab Results  Component Value Date   NA 139 02/26/2020   K 4.3 02/26/2020   CO2 26 02/26/2020   GLUCOSE 77 02/26/2020   BUN 11 02/26/2020   CREATININE 0.90 02/26/2020   BILITOT 0.4  02/26/2020   AST 17 02/26/2020   ALT 14 02/26/2020   PROT 7.2 02/26/2020   CALCIUM 9.4 02/26/2020   Lab Results  Component Value Date   CHOL 173 10/21/2012   Lab Results  Component Value Date   HDL 48 10/21/2012   Lab Results  Component Value Date   LDLCALC 111 (H) 10/21/2012   Lab Results  Component Value Date   TRIG 68 10/21/2012   Lab Results  Component Value Date   CHOLHDL 3.6 10/21/2012   Lab Results  Component Value Date   HGBA1C 5.5 01/29/2020      Assessment & Plan:   Problem List Items Addressed This Visit   None   No orders of the defined types were placed in this encounter.   Follow-up: No follow-ups on file.    Donell Beers, FNP

## 2021-06-14 NOTE — Assessment & Plan Note (Signed)
referral to neurology. No complaints during this visit.

## 2021-06-15 ENCOUNTER — Other Ambulatory Visit: Payer: Self-pay | Admitting: Nurse Practitioner

## 2021-06-15 ENCOUNTER — Ambulatory Visit: Payer: BC Managed Care – PPO | Admitting: Nurse Practitioner

## 2021-06-15 ENCOUNTER — Other Ambulatory Visit: Payer: Self-pay | Admitting: *Deleted

## 2021-06-15 DIAGNOSIS — E559 Vitamin D deficiency, unspecified: Secondary | ICD-10-CM

## 2021-06-15 DIAGNOSIS — G930 Cerebral cysts: Secondary | ICD-10-CM

## 2021-06-15 LAB — HEMOGLOBIN A1C
Est. average glucose Bld gHb Est-mCnc: 105 mg/dL
Hgb A1c MFr Bld: 5.3 % (ref 4.8–5.6)

## 2021-06-15 LAB — CMP14+EGFR
ALT: 21 IU/L (ref 0–32)
AST: 17 IU/L (ref 0–40)
Albumin/Globulin Ratio: 1.7 (ref 1.2–2.2)
Albumin: 4.5 g/dL (ref 3.8–4.8)
Alkaline Phosphatase: 104 IU/L (ref 44–121)
BUN/Creatinine Ratio: 9 (ref 9–23)
BUN: 8 mg/dL (ref 6–20)
Bilirubin Total: <0.2 mg/dL (ref 0.0–1.2)
CO2: 25 mmol/L (ref 20–29)
Calcium: 9.5 mg/dL (ref 8.7–10.2)
Chloride: 104 mmol/L (ref 96–106)
Creatinine, Ser: 0.88 mg/dL (ref 0.57–1.00)
Globulin, Total: 2.7 g/dL (ref 1.5–4.5)
Glucose: 80 mg/dL (ref 70–99)
Potassium: 4.9 mmol/L (ref 3.5–5.2)
Sodium: 140 mmol/L (ref 134–144)
Total Protein: 7.2 g/dL (ref 6.0–8.5)
eGFR: 86 mL/min/{1.73_m2} (ref >59)

## 2021-06-15 LAB — LIPID PANEL
Chol/HDL Ratio: 3.3 ratio (ref 0.0–4.4)
Cholesterol, Total: 183 mg/dL (ref 100–199)
HDL: 56 mg/dL (ref >39)
LDL Chol Calc (NIH): 113 mg/dL — ABNORMAL HIGH (ref 0–99)
Triglycerides: 76 mg/dL (ref 0–149)
VLDL Cholesterol Cal: 14 mg/dL (ref 5–40)

## 2021-06-15 LAB — TSH: TSH: 1.69 u[IU]/mL (ref 0.450–4.500)

## 2021-06-15 LAB — VITAMIN D 25 HYDROXY (VIT D DEFICIENCY, FRACTURES): Vit D, 25-Hydroxy: 27.7 ng/mL — ABNORMAL LOW (ref 30.0–100.0)

## 2021-06-15 MED ORDER — VITAMIN D3 25 MCG (1000 UT) PO CAPS: 1000.0000 [IU] | ORAL_CAPSULE | Freq: Every day | ORAL | 3 refills | Status: DC

## 2021-06-28 ENCOUNTER — Other Ambulatory Visit: Payer: Self-pay | Admitting: Neurosurgery

## 2021-06-28 DIAGNOSIS — G96198 Other disorders of meninges, not elsewhere classified: Secondary | ICD-10-CM

## 2021-07-13 ENCOUNTER — Encounter: Payer: Self-pay | Admitting: Nurse Practitioner

## 2021-07-18 ENCOUNTER — Encounter: Payer: Self-pay | Admitting: Nurse Practitioner

## 2021-07-18 ENCOUNTER — Ambulatory Visit (INDEPENDENT_AMBULATORY_CARE_PROVIDER_SITE_OTHER): Payer: BC Managed Care – PPO | Admitting: Nurse Practitioner

## 2021-07-18 ENCOUNTER — Other Ambulatory Visit: Payer: Self-pay

## 2021-07-18 DIAGNOSIS — R519 Headache, unspecified: Secondary | ICD-10-CM | POA: Insufficient documentation

## 2021-07-18 DIAGNOSIS — R7303 Prediabetes: Secondary | ICD-10-CM

## 2021-07-18 DIAGNOSIS — G44209 Tension-type headache, unspecified, not intractable: Secondary | ICD-10-CM

## 2021-07-18 DIAGNOSIS — E785 Hyperlipidemia, unspecified: Secondary | ICD-10-CM | POA: Diagnosis not present

## 2021-07-18 NOTE — Assessment & Plan Note (Signed)
HA.  Patient told to continue Tylenol as needed for her headache.  Patient told to call back to the office if her headache Gets worse or recurring more frequently nausea or vomiting changes in her vision.  Patient told to get adequate rest And sleep

## 2021-07-18 NOTE — Addendum Note (Signed)
Addended by: Cindra Presume D on: 07/18/2021 09:26 AM   Modules accepted: Orders

## 2021-07-18 NOTE — Addendum Note (Signed)
Addended by: Cindra Presume D on: 07/18/2021 09:19 AM   Modules accepted: Orders

## 2021-07-18 NOTE — Progress Notes (Signed)
Virtual Visit via Telephone Note  I connected with Martha [name] @ on 07/18/21 at  8.14am by telephone and verified that I am speaking with the correct person using two identifiers.  I spent 5 minutes talking to the patient and her chart.   Location: Patient: home Provider: office   I discussed the limitations, risks, security and privacy concerns of performing an evaluation and management service by telephone and the availability of in person appointments. I also discussed with the patient that there may be a patient responsible charge related to this service. The patient expressed understanding and agreed to proceed.   History of Present Illness: Patient complains of headache, she is stated headache started 5 days ago she developed pressure in her forehead sensitive to noise.  She has used Excedrin and Tylenol and allergy.  They did help her symptoms.  Today her headache is a 2/10.  Patient stated that she feels so much better.  patient denies, nausea,vomiting changes in her vision body aches, fever, chills, bodyaches, chest pain, shortness of breath.    Observations/Objective:   Assessment and Plan: HA.  Patient told to continue Tylenol as needed for her headache.  Patient told to call back to the office if her headache  Gets worse or recurring more frequently nausea or vomiting changes in her vision.  Patient told to get adequate rest And sleep. Follow Up Instructions:    I discussed the assessment and treatment plan with the patient. The patient was provided an opportunity to ask questions and all were answered. The patient agreed with the plan and demonstrated an understanding of the instructions.   The patient was advised to call back or seek an in-person evaluation if the symptoms worsen or if the condition fails to improve as anticipated.

## 2021-07-22 ENCOUNTER — Other Ambulatory Visit: Payer: Self-pay

## 2021-07-22 ENCOUNTER — Ambulatory Visit
Admission: RE | Admit: 2021-07-22 | Discharge: 2021-07-22 | Disposition: A | Discharge status: Home / Self Care | Payer: BC Managed Care – PPO | Source: Ambulatory Visit | Attending: Neurosurgery | Admitting: Neurosurgery

## 2021-07-22 DIAGNOSIS — G96198 Other disorders of meninges, not elsewhere classified: Secondary | ICD-10-CM

## 2021-07-22 MED ORDER — GADOBENATE DIMEGLUMINE 529 MG/ML IV SOLN
19.0000 mL | Freq: Once | INTRAVENOUS | Status: AC | PRN
  Administered 2021-07-22: 19 mL via INTRAVENOUS

## 2021-09-21 ENCOUNTER — Inpatient Hospital Stay (HOSPITAL_COMMUNITY): Payer: BC Managed Care – PPO | Attending: Hematology

## 2021-09-21 ENCOUNTER — Other Ambulatory Visit (HOSPITAL_COMMUNITY): Payer: Self-pay

## 2021-09-21 DIAGNOSIS — D509 Iron deficiency anemia, unspecified: Secondary | ICD-10-CM | POA: Insufficient documentation

## 2021-09-21 DIAGNOSIS — D500 Iron deficiency anemia secondary to blood loss (chronic): Secondary | ICD-10-CM

## 2021-09-21 LAB — CBC
HCT: 36.8 % (ref 36.0–46.0)
Hemoglobin: 11.6 g/dL — ABNORMAL LOW (ref 12.0–15.0)
MCH: 27.6 pg (ref 26.0–34.0)
MCHC: 31.5 g/dL (ref 30.0–36.0)
MCV: 87.6 fL (ref 80.0–100.0)
Platelets: 203 10*3/uL (ref 150–400)
RBC: 4.2 MIL/uL (ref 3.87–5.11)
RDW: 13.1 % (ref 11.5–15.5)
WBC: 10 10*3/uL (ref 4.0–10.5)
nRBC: 0 % (ref 0.0–0.2)

## 2021-09-21 LAB — IRON AND TIBC
Iron: 32 ug/dL (ref 28–170)
Saturation Ratios: 11 % (ref 10.4–31.8)
TIBC: 298 ug/dL (ref 250–450)
UIBC: 266 ug/dL

## 2021-09-21 LAB — FERRITIN: Ferritin: 202 ng/mL (ref 11–307)

## 2021-09-25 ENCOUNTER — Telehealth: Payer: Self-pay

## 2021-09-25 ENCOUNTER — Telehealth (HOSPITAL_COMMUNITY): Payer: Self-pay | Admitting: *Deleted

## 2021-09-25 ENCOUNTER — Encounter (HOSPITAL_COMMUNITY): Payer: Self-pay

## 2021-09-25 ENCOUNTER — Encounter: Payer: Self-pay | Admitting: Nurse Practitioner

## 2021-09-25 ENCOUNTER — Other Ambulatory Visit: Payer: Self-pay

## 2021-09-25 DIAGNOSIS — K920 Hematemesis: Secondary | ICD-10-CM

## 2021-09-25 NOTE — Addendum Note (Signed)
Addended by: Gelene Mink on: 09/25/2021 04:08 PM ? ? Modules accepted: Orders ? ?

## 2021-09-25 NOTE — Telephone Encounter (Signed)
CBC released for the pt ?

## 2021-09-25 NOTE — Telephone Encounter (Signed)
Returned the call from message left on my phone and pt was available to ans. LMOVM to return call ?

## 2021-09-25 NOTE — Telephone Encounter (Signed)
Martha [name], ? ?Pt returned call and she advised me that she had vomited last night and this morning about 7 am. She has vomited 5 times and the last 2 times it was blood. She states she had a headache before it started. She thinks it may be due to low iron. But she needed your advise. No vomiting since 7 am this morning. She can be reached at [phone] ?

## 2021-09-25 NOTE — Telephone Encounter (Signed)
Spoke to patient regarding bloody emesis.  She advised that both events produced about a quarter of a cup of blood.  Given her history, she should likely be seen in the Emergency Room, however GI office had attempted to return her call and I advised to call them back.  If she does not reach anyone, she should seek medical attention.  Verbalized understanding. ?

## 2021-09-25 NOTE — Telephone Encounter (Signed)
I sent MyChart message. I also contacted patient and spoke on the phone. No further hematemesis. Believes she may had vomited 1/4 cup of blood after multiple episodes of vomiting. Able to eat lunch (rice bowl with chicken).  ? ?No abdominal pain. Feeling better. She is to go to the ED if further episodes of melena. Likely MW tear.  ? ?Dena: please have her complete CBC tomorrow. She is aware to go to [place]. Need to have it released.  ? ?Stacey: needs office visit.  ?

## 2021-09-25 NOTE — Telephone Encounter (Signed)
error 

## 2021-09-26 ENCOUNTER — Encounter: Payer: Self-pay | Admitting: Internal Medicine

## 2021-09-27 LAB — CBC WITH DIFFERENTIAL/PLATELET
Basophils Absolute: 0 10*3/uL (ref 0.0–0.2)
Basos: 0 %
EOS (ABSOLUTE): 0.1 10*3/uL (ref 0.0–0.4)
Eos: 1 %
Hematocrit: 34.3 % (ref 34.0–46.6)
Hemoglobin: 11.3 g/dL (ref 11.1–15.9)
Immature Grans (Abs): 0.1 10*3/uL (ref 0.0–0.1)
Immature Granulocytes: 1 %
Lymphocytes Absolute: 2.5 10*3/uL (ref 0.7–3.1)
Lymphs: 29 %
MCH: 27.2 pg (ref 26.6–33.0)
MCHC: 32.9 g/dL (ref 31.5–35.7)
MCV: 83 fL (ref 79–97)
Monocytes Absolute: 0.7 10*3/uL (ref 0.1–0.9)
Monocytes: 8 %
Neutrophils Absolute: 5.2 10*3/uL (ref 1.4–7.0)
Neutrophils: 61 %
Platelets: 219 10*3/uL (ref 150–450)
RBC: 4.15 x10E6/uL (ref 3.77–5.28)
RDW: 12.4 % (ref 11.7–15.4)
WBC: 8.5 10*3/uL (ref 3.4–10.8)

## 2021-10-26 ENCOUNTER — Inpatient Hospital Stay (HOSPITAL_COMMUNITY): Payer: BC Managed Care – PPO | Attending: Hematology

## 2021-10-26 DIAGNOSIS — D500 Iron deficiency anemia secondary to blood loss (chronic): Secondary | ICD-10-CM | POA: Diagnosis present

## 2021-10-26 DIAGNOSIS — N920 Excessive and frequent menstruation with regular cycle: Secondary | ICD-10-CM | POA: Insufficient documentation

## 2021-10-26 LAB — CBC WITH DIFFERENTIAL/PLATELET
Abs Immature Granulocytes: 0.04 10*3/uL (ref 0.00–0.07)
Basophils Absolute: 0 10*3/uL (ref 0.0–0.1)
Basophils Relative: 0 %
Eosinophils Absolute: 0.1 10*3/uL (ref 0.0–0.5)
Eosinophils Relative: 1 %
HCT: 37.2 % (ref 36.0–46.0)
Hemoglobin: 11.4 g/dL — ABNORMAL LOW (ref 12.0–15.0)
Immature Granulocytes: 0 %
Lymphocytes Relative: 27 %
Lymphs Abs: 2.6 10*3/uL (ref 0.7–4.0)
MCH: 26.6 pg (ref 26.0–34.0)
MCHC: 30.6 g/dL (ref 30.0–36.0)
MCV: 86.7 fL (ref 80.0–100.0)
Monocytes Absolute: 0.8 10*3/uL (ref 0.1–1.0)
Monocytes Relative: 8 %
Neutro Abs: 6.3 10*3/uL (ref 1.7–7.7)
Neutrophils Relative %: 64 %
Platelets: 245 10*3/uL (ref 150–400)
RBC: 4.29 MIL/uL (ref 3.87–5.11)
RDW: 13.2 % (ref 11.5–15.5)
WBC: 9.8 10*3/uL (ref 4.0–10.5)
nRBC: 0 % (ref 0.0–0.2)

## 2021-10-26 LAB — IRON AND TIBC
Iron: 44 ug/dL (ref 28–170)
Saturation Ratios: 14 % (ref 10.4–31.8)
TIBC: 308 ug/dL (ref 250–450)
UIBC: 264 ug/dL

## 2021-10-26 LAB — FERRITIN: Ferritin: 194 ng/mL (ref 11–307)

## 2021-11-02 ENCOUNTER — Ambulatory Visit (HOSPITAL_COMMUNITY): Payer: BC Managed Care – PPO | Admitting: Physician Assistant

## 2021-11-16 ENCOUNTER — Ambulatory Visit (HOSPITAL_COMMUNITY): Payer: BC Managed Care – PPO | Admitting: Physician Assistant

## 2021-11-21 NOTE — Progress Notes (Unsigned)
Virtual Visit via Telephone Note [facility]  I connected with Darlen Shawnie [name]  on 11/22/21 at  9:53 AM by telephone and verified that I am speaking with the correct person using two identifiers.  Location: Patient: Home Provider: [facility]   I discussed the limitations, risks, security and privacy concerns of performing an evaluation and management service by telephone and the availability of in person appointments. I also discussed with the patient that there may be a patient responsible charge related to this service. The patient expressed understanding and agreed to proceed.  REASON FOR VISIT:  Follow-up for iron deficiency anemia   PRIOR THERAPY: Oral iron tablets   CURRENT THERAPY: Intermittent Venofer, last on 05/22/2021   INTERVAL HISTORY:  [name] 41 y.o. female returns for routine follow-up of her iron deficiency anemia.  She was last seen by Rojelio Brenner PA-C on 04/27/2021.   She had a single episode of hematemesis in April 2023, which was addressed with her GI provider.  She has not had any recurrent hematemesis since that time.  No melena, hematochezia, or epistaxis. She continues to have moderately heavy menstrual periods lasting 7 days, and following with gynecology.   She reports some increased fatigue with energy about 50%.  She reports that she feels "sleepy and foggy," and thinks that this is coming from difficulty sleeping at night.  She denies any chest pain, dyspnea on exertion, pica, or syncopal episodes.  She does have some lightheadedness with standing. She reports 50% energy and 100% appetite.  She is maintaining a stable weight at this time.    OBSERVATIONS/OBJECTIVE: Review of Systems  Constitutional:  Positive for malaise/fatigue. Negative for chills, diaphoresis, fever and weight loss.  Respiratory:  Negative for cough, hemoptysis and shortness of breath.   Cardiovascular:  Negative for chest pain and palpitations.   Gastrointestinal:  Positive for constipation. Negative for abdominal pain, blood in stool, melena, nausea and vomiting.  Neurological:  Positive for dizziness (with standing). Negative for headaches.  Endo/Heme/Allergies:  Bruises/bleeds easily.  Psychiatric/Behavioral:  The patient has insomnia.     PHYSICAL EXAM (per limitations of virtual telephone visit): The patient is alert and oriented x 3, exhibiting adequate mentation, good mood, and ability to speak in full sentences and execute sound judgement.   ASSESSMENT & PLAN: 1.  Iron deficiency anemia: - Most likely etiology is normal blood loss from menses as well as malabsorption in the setting of chronic PPI use - Initial labs on 06/10/2020 with hemoglobin 11 and ferritin of 25. - Additional work-up from 07/21/2020 showed normal B12, methylmalonic acid, copper and folic acid levels.  SPEP was also negative. - She took oral iron for over 1 year without improvement in her levels - No history of transfusion. - EGD on 03/30/2020 with mild erosive reflux esophagitis, medium sized hiatal hernia.  Normal duodenum. - Colonoscopy on 03/30/2020 was normal.  Normal distal ileum. - Most recent IV Venofer (1000 mg) November 2022 - She reported improvement in her energy levels after IV iron infusions   - Complains of feeling tired secondary to not sleeping at night - She has moderate to heavy menses, 7 out of every 28 days, 4 of which are moderately heavy.  Single episode of hematemesis in April 2023, no recurrence.  No bleeding per rectum reported.     - Most recent labs (10/26/2021): Hgb 11.4/MCV 86.7, ferritin 194, iron saturation 14%. - PLAN: No indication for IV iron at this time -  Repeat labs and RTC in 6 months for phone visit   2.  GERD: - This is likely from hiatal hernia.  She is taking Protonix twice weekly.   3.  Social/family history: - She works as a Pension scheme manager. - Maternal first cousin had to have blood transfusions.   Maternal aunt had nasopharyngeal cancer.    I discussed the assessment and treatment plan with the patient. The patient was provided an opportunity to ask questions and all were answered. The patient agreed with the plan and demonstrated an understanding of the instructions.   The patient was advised to call back or seek an in-person evaluation if the symptoms worsen or if the condition fails to improve as anticipated.  I provided 13 minutes of non-face-to-face time during this encounter.   Carnella Guadalajara, PA-C 11/22/21 10:16 AM

## 2021-11-22 ENCOUNTER — Encounter (HOSPITAL_COMMUNITY): Payer: Self-pay | Admitting: Physician Assistant

## 2021-11-22 ENCOUNTER — Ambulatory Visit: Payer: BC Managed Care – PPO | Admitting: Gastroenterology

## 2021-11-22 ENCOUNTER — Inpatient Hospital Stay (HOSPITAL_BASED_OUTPATIENT_CLINIC_OR_DEPARTMENT_OTHER): Payer: BC Managed Care – PPO | Admitting: Physician Assistant

## 2021-11-22 ENCOUNTER — Encounter: Payer: Self-pay | Admitting: Internal Medicine

## 2021-11-22 DIAGNOSIS — N920 Excessive and frequent menstruation with regular cycle: Secondary | ICD-10-CM | POA: Diagnosis not present

## 2021-11-22 DIAGNOSIS — D500 Iron deficiency anemia secondary to blood loss (chronic): Secondary | ICD-10-CM | POA: Diagnosis not present

## 2022-01-16 NOTE — H&P (Signed)
Martha [name] is an 41 y.o. [phone] s/p BTL who is admitted for Hysteroscopy D&C with Myosure Polypectomy and possible Mirena IUD placement for AUB.  Patient has had heavy menses since after child-bearing that began after 2015. Reports periods last x 7 days and is using super plus tampons overnight. She has required iron infusions for iron deficiency anemia due to chronic blood loss. She has previously used COCs but the estrogen in them made her feel ill. She also used Depo Provera in the past. Patient is considering Mirena IUD at the time of procedure and will decide on the day of procedure.  Work-up: Colposcopy (08/03/2021): Cervical biopsy at 2 o'clock and 5 o'clock: CIN I  ECC: CIN I, benign cervical glandular mucosa Pap smear (05/2021): ASCUS/HRHPV positive  SHG (12/19/2021): Uterus 9.01 x 4.28 x 4.68cm Endometrial thickness 0.94cm Right ovary 2.97cm  Left ovary 3.67cm  Anteverted uterus - no uterine anomalies seen Endometrial thickness with questionable hyperechoic mass on the posterior wall - 1.0 x 0.6cm, no blood flow noted. Right ovary wnl - follicles 0000000. Left ovary - complex cyst 1.7 x 1.5 x 1.3cm, avascular. No adnexal masses seen. SHG: Following saline infusion, hyperechoic mass seen on the posterior wall 1.5 x 0.9 x 0.6cm. Endometrial walls thin and smooth - anterior wall 0.2cm, posterior wall 0.2cm; total EM thickness 0.4cm.  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    Past Medical History:  Diagnosis Date   Allergy    Anemia    Phreesia 12/03/2019   ANEMIA 08/09/2010   Qualifier: Diagnosis of  By: Cori Razor LPN, Brandi     Asthma    Breast feeding status of mother 07/07/2015   Heartburn    High cholesterol    Multiparity 06/25/2015   Postpartum care following vaginal delivery (12/31) 06/25/2015   Status post tubal ligation at time of delivery, current hospitalization 06/27/2015   Vomiting blood     Past Surgical History:  Procedure Laterality Date   BIOPSY  03/30/2020   Procedure: BIOPSY;  Surgeon: Daneil Dolin, MD;  Location: [facility];  Service: Endoscopy;;   COLONOSCOPY N/A 03/30/2020   normal colon, normal TI.    cyst removed from right wrist     X3   ESOPHAGOGASTRODUODENOSCOPY  09/2005   Dr. Gala Romney: very small hiatal hernia   ESOPHAGOGASTRODUODENOSCOPY  01/2010   Dr. Gala Romney: small hiatal hernia   ESOPHAGOGASTRODUODENOSCOPY N/A 03/30/2020   mild erosive reflux esophagitis, medium-sized hiatal hernia s/p biopsy, normal duodenum. Schatzki's ring. Negative H.pylori.    laser surgery on cervix     to remove polyp   TUBAL  LIGATION Bilateral 06/26/2015   Procedure: POST PARTUM TUBAL LIGATION;  Surgeon: Maxie Better, MD;  Location: [facility];  Service: Gynecology;  Laterality: Bilateral;    Family History  Problem Relation Age of Onset   Asthma Mother    Allergies Mother    Diabetes Mother        prediabetes   Myasthenia gravis Father    Bladder Cancer Father        diagnosed at 36   Hyperlipidemia Father    Migraines Sister    Cancer Maternal  Aunt        facial   Diabetes Paternal Uncle    Cancer Maternal Grandmother        stomach   Heart disease Paternal Grandmother    Heart disease Paternal Grandfather    Colon cancer Neg Hx    Inflammatory bowel disease Neg Hx    Celiac disease Neg Hx    Breast cancer Neg Hx    Lung cancer Neg Hx     Social History:  reports that she has never smoked. She has never used smokeless tobacco. She reports current alcohol use. She reports that she does not use drugs.  ALLERGIES/MEDS:  Allergies:  Allergies  Allergen Reactions   Penicillins Diarrhea and Nausea And Vomiting    Has patient had a PCN reaction causing immediate rash, facial/tongue/throat swelling, SOB or lightheadedness with hypotension: No Has patient had a PCN reaction causing severe rash involving mucus membranes or skin necrosis: No Has patient had a PCN reaction that required hospitalization No Has patient had a PCN reaction occurring within the last 10 years: Yes If all of the above answers are "NO", then may proceed with Cephalosporin use.     No medications prior to admission.     Review of Systems  Constitutional: Negative.   HENT: Negative.    Eyes: Negative.   Respiratory: Negative.    Cardiovascular: Negative.   Gastrointestinal: Negative.   Genitourinary: Negative.   Musculoskeletal: Negative.   Skin: Negative.   Neurological: Negative.   Endo/Heme/Allergies: Negative.   Psychiatric/Behavioral: Negative.      currently breastfeeding. Gen:  NAD, pleasant and cooperative Cardio:  RRR Pulm:  CTAB, no wheezes/rales/rhonchi Abd:  Soft, non-distended, non-tender throughout, no rebound/guarding Ext:  No bilateral LE edema, no bilateral calf tenderness Pelvic: Labia - unremarkable, vagina - pink moist mucosa, no lesions or abnormal discharge, cervix - no discharge or lesions or CMT  No results found for this or any previous visit (from the past 24 hour(s)).  No results  found.   ASSESSMENT/PLAN: Martha [name] is a 41 y.o. [phone] s/p BTL who is admitted for Hysteroscopy D&C with Myosure Polypectomy and possible Mirena IUD placement for AUB.  - Admit to [facility] - Admit labs (CBC, T&S, COVID screen per protocol) - Diet:  Per anesthesia - IVF:  Per anesthesia - Antibiotics: None - Mirena IUD ordered - patient to decide on day of surgery - D/C home same-day  Consents: I discussed with the patient that this surgery is performed to look inside the uterus and remove the uterine lining.  Prior to surgery, the risks and benefits of the surgery, as well as alternative treatments, have been discussed.  The risks include, but are not limited to bleeding, including the need for a blood transfusion, infection, damage to organs and tissues, including uterine perforation, requiring additional surgery, postoperative pain, short-term and long-term, failure of the procedure to control symptoms, need for hysterectomy to control bleeding, fluid  overload, which could create electrolyte abnormalities and the need to stop the procedure before completion, inability to safely complete the procedure, deep vein thrombosis and/or pulmonary embolism, painful intercourse, complications the course of which cannot be predicted or prevented, and death.  Patient was consented to placement of intrauterine device. The patient is aware there is a risk of expulsion, malposition, and migration. Patient was consented for blood products.  The patient is aware that bleeding may result in the need for a blood transfusion which includes risk of transmission of HIV (1:2 million), Hepatitis C (1:2 million), and Hepatitis B (1:200 thousand) and transfusion reaction.  Patient voiced understanding of the above risks as well as understanding of indications for blood transfusion.  [name], DO [phone] (office)

## 2022-01-23 ENCOUNTER — Encounter (HOSPITAL_BASED_OUTPATIENT_CLINIC_OR_DEPARTMENT_OTHER): Payer: Self-pay | Admitting: Obstetrics and Gynecology

## 2022-01-24 ENCOUNTER — Encounter (HOSPITAL_BASED_OUTPATIENT_CLINIC_OR_DEPARTMENT_OTHER): Payer: Self-pay | Admitting: Obstetrics and Gynecology

## 2022-01-24 NOTE — Progress Notes (Signed)
Spoke w/ via phone for pre-op interview--- pt Lab needs dos----   urine preg (per anes)/  pre-op orders pending            Lab results------ no COVID test -----patient states asymptomatic no test needed Arrive at ------- 1115 on 01-31-2022 NPO after MN NO Solid Food.  Clear liquids from MN until--- 1015 Med rec completed Medications to take morning of surgery ----- protonix Diabetic medication ----- n/a Patient instructed no nail polish to be worn day of surgery Patient instructed to bring photo id and insurance card day of surgery Patient aware to have Driver (ride ) / caregiver  for 24 hours after surgery --husband, hunter Patient Special Instructions ----- n/a Pre-Op special Istructions ----- sent inbox message to dr Connye Burkitt , requested orders Patient verbalized understanding of instructions that were given at this phone interview. Patient denies shortness of breath, chest pain, fever, cough at this phone interview.

## 2022-01-30 ENCOUNTER — Encounter (HOSPITAL_COMMUNITY): Payer: Self-pay | Admitting: Hematology

## 2022-01-31 ENCOUNTER — Ambulatory Visit (HOSPITAL_BASED_OUTPATIENT_CLINIC_OR_DEPARTMENT_OTHER): Payer: BC Managed Care – PPO | Admitting: Certified Registered Nurse Anesthetist

## 2022-01-31 ENCOUNTER — Ambulatory Visit (HOSPITAL_BASED_OUTPATIENT_CLINIC_OR_DEPARTMENT_OTHER)
Admission: RE | Admit: 2022-01-31 | Discharge: 2022-01-31 | Disposition: A | Discharge status: Home / Self Care | Payer: BC Managed Care – PPO | Source: Ambulatory Visit | Attending: Obstetrics and Gynecology | Admitting: Obstetrics and Gynecology

## 2022-01-31 ENCOUNTER — Other Ambulatory Visit: Payer: Self-pay

## 2022-01-31 ENCOUNTER — Encounter (HOSPITAL_BASED_OUTPATIENT_CLINIC_OR_DEPARTMENT_OTHER): Payer: Self-pay | Admitting: Obstetrics and Gynecology

## 2022-01-31 ENCOUNTER — Encounter (HOSPITAL_BASED_OUTPATIENT_CLINIC_OR_DEPARTMENT_OTHER)
Admission: RE | Disposition: A | Discharge status: Home / Self Care | Payer: Self-pay | Source: Ambulatory Visit | Attending: Obstetrics and Gynecology

## 2022-01-31 DIAGNOSIS — Z6833 Body mass index (BMI) 33.0-33.9, adult: Secondary | ICD-10-CM | POA: Insufficient documentation

## 2022-01-31 DIAGNOSIS — D509 Iron deficiency anemia, unspecified: Secondary | ICD-10-CM | POA: Insufficient documentation

## 2022-01-31 DIAGNOSIS — E669 Obesity, unspecified: Secondary | ICD-10-CM | POA: Insufficient documentation

## 2022-01-31 DIAGNOSIS — N939 Abnormal uterine and vaginal bleeding, unspecified: Secondary | ICD-10-CM | POA: Diagnosis present

## 2022-01-31 DIAGNOSIS — Z01818 Encounter for other preprocedural examination: Secondary | ICD-10-CM

## 2022-01-31 DIAGNOSIS — N840 Polyp of corpus uteri: Secondary | ICD-10-CM | POA: Diagnosis not present

## 2022-01-31 DIAGNOSIS — N9489 Other specified conditions associated with female genital organs and menstrual cycle: Secondary | ICD-10-CM

## 2022-01-31 DIAGNOSIS — D759 Disease of blood and blood-forming organs, unspecified: Secondary | ICD-10-CM | POA: Insufficient documentation

## 2022-01-31 HISTORY — PX: DILATATION & CURETTAGE/HYSTEROSCOPY WITH MYOSURE: SHX6511

## 2022-01-31 HISTORY — DX: Prediabetes: R73.03

## 2022-01-31 HISTORY — DX: Gastro-esophageal reflux disease without esophagitis: K21.9

## 2022-01-31 HISTORY — DX: Abnormal uterine and vaginal bleeding, unspecified: N93.9

## 2022-01-31 HISTORY — DX: Hyperlipidemia, unspecified: E78.5

## 2022-01-31 HISTORY — DX: Diaphragmatic hernia without obstruction or gangrene: K44.9

## 2022-01-31 HISTORY — DX: Presence of spectacles and contact lenses: Z97.3

## 2022-01-31 HISTORY — DX: Iron deficiency anemia secondary to blood loss (chronic): D50.0

## 2022-01-31 LAB — CBC
HCT: 38.8 % (ref 36.0–46.0)
HCT: 38 % (ref 36.0–46.0)
Hemoglobin: 12 g/dL (ref 12.0–15.0)
Hemoglobin: 11.3 g/dL — ABNORMAL LOW (ref 12.0–15.0)
MCH: 26.4 pg (ref 26.0–34.0)
MCH: 26.8 pg (ref 26.0–34.0)
MCHC: 30.9 g/dL (ref 30.0–36.0)
MCHC: 29.7 g/dL — ABNORMAL LOW (ref 30.0–36.0)
MCV: 85.5 fL (ref 80.0–100.0)
MCV: 90 fL (ref 80.0–100.0)
Platelets: 184 10*3/uL (ref 150–400)
Platelets: 192 10*3/uL (ref 150–400)
RBC: 4.54 MIL/uL (ref 3.87–5.11)
RBC: 4.22 MIL/uL (ref 3.87–5.11)
RDW: 13.4 % (ref 11.5–15.5)
RDW: 13.2 % (ref 11.5–15.5)
WBC: 7.1 10*3/uL (ref 4.0–10.5)
WBC: 9.1 10*3/uL (ref 4.0–10.5)
nRBC: 0 % (ref 0.0–0.2)
nRBC: 0 % (ref 0.0–0.2)

## 2022-01-31 LAB — POCT PREGNANCY, URINE: Preg Test, Ur: NEGATIVE

## 2022-01-31 LAB — TYPE AND SCREEN
ABO/RH(D): O POS
Antibody Screen: NEGATIVE

## 2022-01-31 LAB — ABO/RH: ABO/RH(D): O POS

## 2022-01-31 SURGERY — DILATATION & CURETTAGE/HYSTEROSCOPY WITH MYOSURE
Anesthesia: General | Site: Uterus

## 2022-01-31 MED ORDER — FENTANYL CITRATE (PF) 100 MCG/2ML IJ SOLN
INTRAMUSCULAR | Status: AC
  Filled 2022-01-31: qty 2

## 2022-01-31 MED ORDER — DEXAMETHASONE SODIUM PHOSPHATE 10 MG/ML IJ SOLN
INTRAMUSCULAR | Status: DC | PRN
  Administered 2022-01-31: 10 mg via INTRAVENOUS

## 2022-01-31 MED ORDER — FENTANYL CITRATE (PF) 100 MCG/2ML IJ SOLN
INTRAMUSCULAR | Status: DC | PRN
Start: 2022-01-31 — End: 2022-01-31
  Administered 2022-01-31 (×2): 50 ug via INTRAVENOUS

## 2022-01-31 MED ORDER — LIDOCAINE HCL (PF) 2 % IJ SOLN
INTRAMUSCULAR | Status: AC
  Filled 2022-01-31: qty 5

## 2022-01-31 MED ORDER — AMISULPRIDE (ANTIEMETIC) 5 MG/2ML IV SOLN
10.0000 mg | Freq: Once | INTRAVENOUS | Status: DC | PRN
Start: 2022-01-31 — End: 2022-01-31

## 2022-01-31 MED ORDER — HYDROMORPHONE HCL 1 MG/ML IJ SOLN
INTRAMUSCULAR | Status: AC
  Filled 2022-01-31: qty 1

## 2022-01-31 MED ORDER — GLYCOPYRROLATE PF 0.2 MG/ML IJ SOSY
PREFILLED_SYRINGE | INTRAMUSCULAR | Status: AC
  Filled 2022-01-31: qty 1

## 2022-01-31 MED ORDER — IBUPROFEN 800 MG PO TABS: 800.0000 mg | ORAL_TABLET | Freq: Three times a day (TID) | ORAL | 1 refills | Status: DC | PRN

## 2022-01-31 MED ORDER — PROMETHAZINE HCL 25 MG/ML IJ SOLN: 6.2500 mg | INTRAMUSCULAR | Status: DC | PRN

## 2022-01-31 MED ORDER — LACTATED RINGERS IV SOLN: INTRAVENOUS | Status: DC

## 2022-01-31 MED ORDER — HYDROMORPHONE HCL 1 MG/ML IJ SOLN
0.2500 mg | INTRAMUSCULAR | Status: DC | PRN
  Administered 2022-01-31 (×2): 0.25 mg via INTRAVENOUS

## 2022-01-31 MED ORDER — KETOROLAC TROMETHAMINE 30 MG/ML IJ SOLN
INTRAMUSCULAR | Status: AC
  Filled 2022-01-31: qty 2

## 2022-01-31 MED ORDER — MEPERIDINE HCL 25 MG/ML IJ SOLN: 6.2500 mg | INTRAMUSCULAR | Status: DC | PRN

## 2022-01-31 MED ORDER — PROPOFOL 10 MG/ML IV BOLUS
INTRAVENOUS | Status: DC | PRN
  Administered 2022-01-31: 200 mg via INTRAVENOUS

## 2022-01-31 MED ORDER — ONDANSETRON HCL 4 MG/2ML IJ SOLN
INTRAMUSCULAR | Status: AC
  Filled 2022-01-31: qty 2

## 2022-01-31 MED ORDER — LEVONORGESTREL 20 MCG/DAY IU IUD: 1.0000 | INTRAUTERINE_SYSTEM | INTRAUTERINE | Status: DC

## 2022-01-31 MED ORDER — OXYCODONE HCL 5 MG PO TABS: 5.0000 mg | ORAL_TABLET | Freq: Once | ORAL | Status: DC | PRN

## 2022-01-31 MED ORDER — MIDAZOLAM HCL 5 MG/5ML IJ SOLN
INTRAMUSCULAR | Status: DC | PRN
  Administered 2022-01-31: 2 mg via INTRAVENOUS

## 2022-01-31 MED ORDER — SILVER NITRATE-POT NITRATE 75-25 % EX MISC
CUTANEOUS | Status: DC | PRN
  Administered 2022-01-31: 2

## 2022-01-31 MED ORDER — LIDOCAINE 2% (20 MG/ML) 5 ML SYRINGE
INTRAMUSCULAR | Status: DC | PRN
  Administered 2022-01-31: 60 mg via INTRAVENOUS

## 2022-01-31 MED ORDER — OXYCODONE HCL 5 MG/5ML PO SOLN: 5.0000 mg | Freq: Once | ORAL | Status: DC | PRN

## 2022-01-31 MED ORDER — MIDAZOLAM HCL 2 MG/2ML IJ SOLN
INTRAMUSCULAR | Status: AC
  Filled 2022-01-31: qty 2

## 2022-01-31 MED ORDER — GLYCOPYRROLATE PF 0.2 MG/ML IJ SOSY
PREFILLED_SYRINGE | INTRAMUSCULAR | Status: DC | PRN
  Administered 2022-01-31: .2 mg via INTRAVENOUS

## 2022-01-31 MED ORDER — SODIUM CHLORIDE 0.9 % IR SOLN
Status: DC | PRN
  Administered 2022-01-31: 3000 mL

## 2022-01-31 MED ORDER — KETOROLAC TROMETHAMINE 30 MG/ML IJ SOLN
INTRAMUSCULAR | Status: DC | PRN
  Administered 2022-01-31: 30 mg via INTRAVENOUS

## 2022-01-31 MED ORDER — ONDANSETRON HCL 4 MG/2ML IJ SOLN
INTRAMUSCULAR | Status: DC | PRN
  Administered 2022-01-31: 4 mg via INTRAVENOUS

## 2022-01-31 MED ORDER — DEXAMETHASONE SODIUM PHOSPHATE 10 MG/ML IJ SOLN
INTRAMUSCULAR | Status: AC
  Filled 2022-01-31: qty 1

## 2022-01-31 MED ORDER — PROPOFOL 10 MG/ML IV BOLUS
INTRAVENOUS | Status: AC
  Filled 2022-01-31: qty 20

## 2022-01-31 SURGICAL SUPPLY — 19 items
CATH ROBINSON RED A/P 16FR (CATHETERS) ×3 IMPLANT
DEVICE MYOSURE LITE (MISCELLANEOUS) ×2 IMPLANT
DEVICE MYOSURE REACH (MISCELLANEOUS) IMPLANT
DILATOR CANAL MILEX (MISCELLANEOUS) IMPLANT
DRSG TELFA 3X8 NADH (GAUZE/BANDAGES/DRESSINGS) ×3 IMPLANT
GAUZE 4X4 16PLY ~~LOC~~+RFID DBL (SPONGE) ×3 IMPLANT
GLOVE BIO SURGEON STRL SZ 6.5 (GLOVE) ×3 IMPLANT
GLOVE BIOGEL M 6.5 STRL (GLOVE) ×3 IMPLANT
GLOVE BIOGEL PI IND STRL 7.0 (GLOVE) ×2 IMPLANT
GLOVE BIOGEL PI INDICATOR 7.0 (GLOVE) ×1
GOWN STRL REUS W/TWL LRG LVL3 (GOWN DISPOSABLE) ×3 IMPLANT
KIT PROCEDURE FLUENT (KITS) ×3 IMPLANT
KIT TURNOVER CYSTO (KITS) ×3 IMPLANT
PACK VAGINAL MINOR WOMEN LF (CUSTOM PROCEDURE TRAY) ×3 IMPLANT
PAD DRESSING TELFA 3X8 NADH (GAUZE/BANDAGES/DRESSINGS) ×1 IMPLANT
PAD OB MATERNITY 4.3X12.25 (PERSONAL CARE ITEMS) ×3 IMPLANT
SEAL ROD LENS SCOPE MYOSURE (ABLATOR) ×3 IMPLANT
TOWEL OR 17X26 10 PK STRL BLUE (TOWEL DISPOSABLE) ×6 IMPLANT
UNDERPAD 30X36 HEAVY ABSORB (UNDERPADS AND DIAPERS) ×3 IMPLANT

## 2022-01-31 NOTE — Discharge Instructions (Addendum)
No ibuprofen, Advil, Aleve, Motrin, ketorolac, meloxicam, naproxen, or other NSAIDS until after 7:00pm today if needed for pain.     DISCHARGE INSTRUCTIONS: HYSTEROSCOPY / ENDOMETRIAL ABLATION The following instructions have been prepared to help you care for yourself upon your return home.    Personal hygiene:  Use sanitary pads for vaginal drainage, not tampons.  Shower the day after your procedure.  NO tub baths, pools or Jacuzzis for 2-3 weeks.  Wipe front to back after using the bathroom.  Activity and limitations:  Do NOT drive or operate any equipment for 24 hours. The effects of anesthesia are still present and drowsiness may result.  Do NOT rest in bed all day.  Walking is encouraged.  Walk up and down stairs slowly.  You may resume your normal activity in one to two days or as indicated by your physician. Sexual activity: NO intercourse for at least 2 weeks after the procedure, or as indicated by your Doctor.  Diet: Eat a light meal as desired this evening. You may resume your usual diet tomorrow.  Return to Work: You may resume your work activities in one to two days or as indicated by Therapist, sports.  What to expect after your surgery: Expect to have vaginal bleeding/discharge for 2-3 days and spotting for up to 10 days. It is not unusual to have soreness for up to 1-2 weeks. You may have a slight burning sensation when you urinate for the first day. Mild cramps may continue for a couple of days. You may have a regular period in 2-6 weeks.  Call your doctor for any of the following:  Excessive vaginal bleeding or clotting, saturating and changing one pad every hour.  Inability to urinate 6 hours after discharge from hospital.  Pain not relieved by pain medication.  Fever of 100.4 F or greater.  Unusual vaginal discharge or odor.       Post Anesthesia Home Care Instructions  Activity: Get plenty of rest for the remainder of the day. A responsible individual  must stay with you for 24 hours following the procedure.  For the next 24 hours, DO NOT: -Drive a car -Advertising copywriter -Drink alcoholic beverages -Take any medication unless instructed by your physician -Make any legal decisions or sign important papers.  Meals: Start with liquid foods such as gelatin or soup. Progress to regular foods as tolerated. Avoid greasy, spicy, heavy foods. If nausea and/or vomiting occur, drink only clear liquids until the nausea and/or vomiting subsides. Call your physician if vomiting continues.  Special Instructions/Symptoms: Your throat may feel dry or sore from the anesthesia or the breathing tube placed in your throat during surgery. If this causes discomfort, gargle with warm salt water. The discomfort should disappear within 24 hours.

## 2022-01-31 NOTE — Anesthesia Procedure Notes (Signed)
Procedure Name: LMA Insertion Date/Time: 01/31/2022 12:44 PM  Performed by: Bishop Limbo, CRNAPre-anesthesia Checklist: Patient identified, Emergency Drugs available, Suction available and Patient being monitored Patient Re-evaluated:Patient Re-evaluated prior to induction Oxygen Delivery Method: Circle System Utilized Preoxygenation: Pre-oxygenation with 100% oxygen Induction Type: IV induction Ventilation: Mask ventilation without difficulty LMA: LMA inserted LMA Size: 4.0 Number of attempts: 1 Placement Confirmation: positive ETCO2 Tube secured with: Tape Dental Injury: Teeth and Oropharynx as per pre-operative assessment

## 2022-01-31 NOTE — Anesthesia Preprocedure Evaluation (Signed)
Anesthesia Evaluation  Patient identified by MRN, date of birth, ID band Patient awake    Reviewed: Allergy & Precautions, NPO status , Patient's Chart, lab work & pertinent test results  Airway Mallampati: II  TM Distance: >3 FB Neck ROM: Full    Dental  (+) Teeth Intact   Pulmonary asthma ,    breath sounds clear to auscultation       Cardiovascular negative cardio ROS   Rhythm:Regular Rate:Normal     Neuro/Psych  Headaches, negative psych ROS   GI/Hepatic Neg liver ROS, GERD  ,  Endo/Other  negative endocrine ROS  Renal/GU negative Renal ROS  negative genitourinary   Musculoskeletal  (+) Arthritis , Osteoarthritis,    Abdominal (+) + obese,   Peds  Hematology  (+) Blood dyscrasia, anemia ,   Anesthesia Other Findings   Reproductive/Obstetrics negative OB ROS                             Lab Results  Component Value Date   WBC 7.1 01/31/2022   HGB 12.0 01/31/2022   HCT 38.8 01/31/2022   MCV 85.5 01/31/2022   PLT 184 01/31/2022   No results found for: "INR", "PROTIME"   Anesthesia Physical  Anesthesia Plan  ASA: II  Anesthesia Plan: General   Post-op Pain Management: Minimal or no pain anticipated   Induction: Intravenous  PONV Risk Score and Plan: 3 and Ondansetron, Dexamethasone, Midazolam and Treatment may vary due to age or medical condition  Airway Management Planned: LMA  Additional Equipment:   Intra-op Plan:   Post-operative Plan: Extubation in OR  Informed Consent: I have reviewed the patients History and Physical, chart, labs and discussed the procedure including the risks, benefits and alternatives for the proposed anesthesia with the patient or authorized representative who has indicated his/her understanding and acceptance.       Plan Discussed with: CRNA  Anesthesia Plan Comments:         Anesthesia Quick Evaluation

## 2022-01-31 NOTE — Transfer of Care (Signed)
Immediate Anesthesia Transfer of Care Note  Patient: Martha [name]  Procedure(s) Performed: DILATATION & CURETTAGE/HYSTEROSCOPY WITH MYOSURE (Uterus)  Patient Location: PACU  Anesthesia Type:General  Level of Consciousness: awake, alert , oriented and patient cooperative  Airway & Oxygen Therapy: Patient Spontanous Breathing  Post-op Assessment: Report given to RN and Post -op Vital signs reviewed and stable  Post vital signs: Reviewed and stable  Last Vitals:  Vitals Value Taken Time  BP 117/64 01/31/22 1308  Temp    Pulse 105 01/31/22 1310  Resp 20 01/31/22 1310  SpO2 100 % 01/31/22 1310  Vitals shown include unvalidated device data.  Last Pain:  Vitals:   01/31/22 1158  TempSrc: Oral  PainSc: 0-No pain      Patients Stated Pain Goal: 4 (01/31/22 1158)  Complications: No notable events documented.

## 2022-01-31 NOTE — Progress Notes (Signed)
Patient seen in the PACU. Discussed with patient (and her husband via telephone) that IUD was not placed due to partial uterine perforation. We discussed that this will heal with time and that IUD can be placed outpatient at a later date. Discussed findings of uterine polyp which was removed. Patient verbalized understanding. CBC to be obtained prior to discharge.  [name], DO

## 2022-01-31 NOTE — Op Note (Addendum)
Pre Op Dx:   1. Abnormal uterine bleeding 2. Endometrial mass  Post Op Dx:   1. Abnormal uterine bleeding 2. Endometrial polyp 3. Partial uterine perforation  Procedure:   1. Hysteroscopy with Myosure resection of endometrial polyp   Surgeon:  Dr. [name] Assistants:  None Anesthesia:  General   EBL:  10cc  IVF:  See anesthesia documentation UOP:  Voided prior to arrival to OR Fluid Deficit:  745cc   Drains:  None Specimen removed:  Endometrial polyp and sample of endometrium - sent to pathology Device(s) implanted: None Case Type:  Clean-contaminated Findings:  Normal-appearing cervix. Endometrium appeared normal. Bilateral tubal ostia visualized clearly. Partial uterine perforation noted at the fundus after dilation with Pratt dilator which was visualized to be hemostatic. The defect was noted to be smooth and it was clear that it was not a full-thickness perforation. Small broad-based polyp on posterior endometrium was noted well away from the fundus. Complications: None  Indications:  41 y.o. [id number] s/p BTL with AUB and endometrial mass on SHG suspected to be an endometrial polyp. Desired Mirena IUD insertion for heavy menses; however, unable to place due to partial uterine perforation.  Description of each procedure:  After informed consent was obtained the patient was taken to the operating room in the dorsal supine position.  After administration of general anesthesia, the patient was placed in the dorsal lithotomy position and prepped and draped in the usual sterile fashion. A pre-operative time-out was completed.  The anterior lip of the cervix was grasped with a single-tooth tenaculum and the cervix was serially dilated to accommodate the hysteroscope.  The hysteroscope was advanced and the findings as above was noted. A partial uterine perforation was noted at the fundus and appeared hemostatic. The Myosure Lite was used to resect the posterior endometrial polyp and  sample the endometrium under good visualization well away from the fundus. Due to the partial uterine perforation, decision was made to defer placing the IUD until a later time once this defect has healed. The single-tooth tenaculum was removed and its sites were made hemostatic with silver nitrate.  Adequate hemostasis was noted.  The patient was awakened and extubated and appeared to have tolerated the procedure well.  All counts were correct.  Disposition:  PACU  [name], DO

## 2022-01-31 NOTE — Anesthesia Postprocedure Evaluation (Signed)
Anesthesia Post Note  Patient: Martha [name]  Procedure(s) Performed: DILATATION & CURETTAGE/HYSTEROSCOPY WITH MYOSURE (Uterus)     Patient location during evaluation: PACU Anesthesia Type: General Level of consciousness: awake and alert Pain management: pain level controlled Vital Signs Assessment: post-procedure vital signs reviewed and stable Respiratory status: spontaneous breathing, nonlabored ventilation and respiratory function stable Cardiovascular status: blood pressure returned to baseline and stable Postop Assessment: no apparent nausea or vomiting Anesthetic complications: no   No notable events documented.  Last Vitals:  Vitals:   01/31/22 1345 01/31/22 1400  BP: 118/73 120/70  Pulse: 73 81  Resp: 13 13  Temp: (!) 36.3 C   SpO2: 100% 100%    Last Pain:  Vitals:   01/31/22 1400  TempSrc:   PainSc: 2                  Lowella Curb

## 2022-01-31 NOTE — Interval H&P Note (Signed)
History and Physical Interval Note:  01/31/2022 12:27 PM  Martha [name]  has presented today for surgery, with the diagnosis of N93.9 AUB.  The various methods of treatment have been discussed with the patient and family. After consideration of risks, benefits and other options for treatment, the patient has consented to  Procedure(s): DILATATION & CURETTAGE/HYSTEROSCOPY WITH MYOSURE (N/A) INTRAUTERINE DEVICE (IUD) INSERTION (N/A) as a surgical intervention.  The patient's history has been reviewed, patient examined, no change in status, stable for surgery.  I have reviewed the patient's chart and labs.  Questions were answered to the patient's satisfaction.     [name]

## 2022-02-01 ENCOUNTER — Encounter (HOSPITAL_BASED_OUTPATIENT_CLINIC_OR_DEPARTMENT_OTHER): Payer: Self-pay | Admitting: Obstetrics and Gynecology

## 2022-02-01 LAB — SURGICAL PATHOLOGY

## 2022-04-26 ENCOUNTER — Encounter (HOSPITAL_COMMUNITY): Payer: Self-pay | Admitting: Hematology

## 2022-04-26 ENCOUNTER — Telehealth: Payer: BC Managed Care – PPO | Admitting: Physician Assistant

## 2022-04-26 DIAGNOSIS — R079 Chest pain, unspecified: Secondary | ICD-10-CM

## 2022-04-26 DIAGNOSIS — J069 Acute upper respiratory infection, unspecified: Secondary | ICD-10-CM

## 2022-04-26 NOTE — Progress Notes (Signed)
Because of chest pain and difficulty catching breath, I feel your condition warrants further evaluation and I recommend that you be seen in a face to face visit.   NOTE: There will be NO CHARGE for this eVisit   If you are having a true medical emergency please call 911.      For an urgent face to face visit, [facility] has seven urgent care centers for your convenience:     [place] [place] [facility] [place] Get Driving Directions [phone] [place], [place] [address]    [place] [facility]) Get Driving Directions [phone] [place], [place] [address]  [place] [facility] [place] ([place]) Get Driving Directions [phone] [address] [place],  [place]  [address]  [place] [facility] - at [address] Get Driving Directions  [phone] [address] [place],  [place] [address]   [facility] Get Driving Directions [phone] [address] [place], [place], [place] [address]   [facility] Get Driving Directions  [phone] [address] [place], [place] [address]   [facility] [place] Get Driving Directions [phone] [address] [place],  [address]  Your MyChart E-visit questionnaire answers were reviewed by a board certified advanced clinical practitioner to complete your personal care plan based on your specific symptoms.  Thank you for using e-Visits.

## 2022-04-27 ENCOUNTER — Encounter: Payer: Self-pay | Admitting: Internal Medicine

## 2022-04-27 ENCOUNTER — Ambulatory Visit: Payer: BC Managed Care – PPO | Admitting: Internal Medicine

## 2022-04-27 ENCOUNTER — Ambulatory Visit (INDEPENDENT_AMBULATORY_CARE_PROVIDER_SITE_OTHER): Payer: BC Managed Care – PPO | Admitting: Internal Medicine

## 2022-04-27 VITALS — BP 110/73 | HR 90 | Ht 66.5 in | Wt 211.4 lb

## 2022-04-27 DIAGNOSIS — J208 Acute bronchitis due to other specified organisms: Secondary | ICD-10-CM

## 2022-04-27 MED ORDER — NEBULIZER MASK ADULT MISC: 1.0000 | 0 refills | Status: AC | PRN

## 2022-04-27 MED ORDER — DEXTROMETHORPHAN HBR 15 MG/5ML PO SYRP: 10.0000 mL | ORAL_SOLUTION | Freq: Four times a day (QID) | ORAL | 0 refills | Status: DC | PRN

## 2022-04-27 NOTE — Assessment & Plan Note (Signed)
Presenting today for evaluation of a 3-day history of nonproductive cough and shortness of breath.  Her symptoms today seem most consistent with acute viral bronchitis.  She has been using albuterol inhaler for as needed use, which is effective during the day.  I have prescribed dextromethorphan for symptom relief at night.  I have also recommended that she drink hot tea with honey for symptom relief.  I recommended that she return to care if she develops additional symptoms such as fever/chills, sputum production, or significant sinus congestion.  Otherwise, we will plan for routine follow-up in late December.

## 2022-04-27 NOTE — Patient Instructions (Signed)
It was a pleasure to see you today.  Thank you for giving [place] the opportunity to be involved in your care.  Below is a brief recap of your visit and next steps.  We will plan to see you again in late December.  Summary I have prescribed dextromethorphan today for cough relief and ordered an adult mask for your nebulizer Follow up in late December for your annual exam.

## 2022-04-27 NOTE — Progress Notes (Signed)
Acute Office Visit  Subjective:     Patient ID: CING [place], female    DOB: 08/08/1980, 41 y.o.   MRN: [id number]  Chief Complaint  Patient presents with   Bronchitis    cough   Ms. Frisby presents today for an acute visit to discuss upper respiratory symptoms.  She reports a 3-day history of nonproductive cough, hoarseness, and shortness of breath.  She reports a history of bronchitis that she develops every fall.  Ms. Stmarie denies fever/chills, sputum production, and sinus congestion.  She has been managing her symptoms with an albuterol inhaler and drinking hot tea.  She states that in the past she has required cough medication for symptom relief.  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  Review of Systems  Constitutional:  Negative for chills and fever.  HENT:  Negative for congestion, ear pain, sinus pain and sore throat.   Respiratory:  Positive for cough and shortness of breath. Negative for sputum production.   All other systems reviewed and are negative.     Objective:    BP 110/73   Pulse 90   Ht 5' 6.5" (1.689 m)   Wt 211 lb 6.4 oz (95.9 kg)   SpO2 98%   BMI 33.61 kg/m    Physical Exam Constitutional:      General: She is not in acute distress.    Appearance: Normal appearance. She is not toxic-appearing.  HENT:     Head: Normocephalic and atraumatic.     Right Ear: External ear normal.     Left Ear: External ear normal.     Nose: Nose normal. No congestion or rhinorrhea.     Mouth/Throat:     Mouth: Mucous membranes are moist.     Pharynx: Oropharynx is clear. No oropharyngeal exudate or posterior oropharyngeal erythema.  Eyes:     General: No scleral icterus.    Extraocular Movements: Extraocular movements intact.     Conjunctiva/sclera: Conjunctivae normal.     Pupils: Pupils are equal, round, and reactive to light.  Cardiovascular:     Rate and Rhythm: Normal rate and regular rhythm.      Pulses: Normal pulses.     Heart sounds: Normal heart sounds. No murmur heard.    No friction rub. No gallop.  Pulmonary:     Effort: Pulmonary effort is normal. No respiratory distress.     Breath sounds: Wheezing (Upper airway wheezes bilaterally) present. No rhonchi or rales.  Abdominal:     General: Abdomen is flat. Bowel sounds are normal. There is no distension.     Palpations: Abdomen is soft.     Tenderness: There is no abdominal tenderness.  Musculoskeletal:        General: No swelling. Normal range of motion.     Cervical back: Normal range of motion.     Right lower leg: No edema.     Left lower leg: No edema.  Lymphadenopathy:     Cervical: No cervical adenopathy.  Skin:    General: Skin is warm and dry.     Capillary Refill: Capillary refill takes less than 2 seconds.     Coloration: Skin is not jaundiced.  Neurological:     General: No focal deficit present.     Mental Status: She is alert and oriented to person, place, and time.  Psychiatric:        Mood and Affect: Mood normal.  Behavior: Behavior normal.       Assessment & Plan:   Problem List Items Addressed This Visit       Acute viral bronchitis - Primary    Presenting today for evaluation of a 3-day history of nonproductive cough and shortness of breath.  Her symptoms today seem most consistent with acute viral bronchitis.  She has been using albuterol inhaler for as needed use, which is effective during the day.  I have prescribed dextromethorphan for symptom relief at night.  I have also recommended that she drink hot tea with honey for symptom relief.  I recommended that she return to care if she develops additional symptoms such as fever/chills, sputum production, or significant sinus congestion.  Otherwise, we will plan for routine follow-up in late December.       Meds ordered this encounter  Medications   dextromethorphan 15 MG/5ML syrup    Sig: Take 10 mLs (30 mg total) by mouth 4 (four)  times daily as needed for cough.    Dispense:  120 mL    Refill:  0   Respiratory Therapy Supplies (NEBULIZER MASK ADULT) MISC    Sig: 1 each by Does not apply route as needed.    Dispense:  1 each    Refill:  0    Return in about 8 weeks (around 06/22/2022).  Billie Lade, MD

## 2022-04-30 ENCOUNTER — Encounter (HOSPITAL_COMMUNITY): Payer: Self-pay | Admitting: Hematology

## 2022-05-24 ENCOUNTER — Inpatient Hospital Stay: Payer: BC Managed Care – PPO | Attending: Physician Assistant

## 2022-05-24 DIAGNOSIS — N920 Excessive and frequent menstruation with regular cycle: Secondary | ICD-10-CM | POA: Diagnosis present

## 2022-05-24 DIAGNOSIS — D500 Iron deficiency anemia secondary to blood loss (chronic): Secondary | ICD-10-CM | POA: Diagnosis present

## 2022-05-24 LAB — CBC WITH DIFFERENTIAL/PLATELET
Abs Immature Granulocytes: 0.03 10*3/uL (ref 0.00–0.07)
Basophils Absolute: 0 10*3/uL (ref 0.0–0.1)
Basophils Relative: 0 %
Eosinophils Absolute: 0.1 10*3/uL (ref 0.0–0.5)
Eosinophils Relative: 1 %
HCT: 34.9 % — ABNORMAL LOW (ref 36.0–46.0)
Hemoglobin: 10.8 g/dL — ABNORMAL LOW (ref 12.0–15.0)
Immature Granulocytes: 0 %
Lymphocytes Relative: 28 %
Lymphs Abs: 2.3 10*3/uL (ref 0.7–4.0)
MCH: 26.3 pg (ref 26.0–34.0)
MCHC: 30.9 g/dL (ref 30.0–36.0)
MCV: 85.1 fL (ref 80.0–100.0)
Monocytes Absolute: 0.7 10*3/uL (ref 0.1–1.0)
Monocytes Relative: 8 %
Neutro Abs: 5.2 10*3/uL (ref 1.7–7.7)
Neutrophils Relative %: 63 %
Platelets: 255 10*3/uL (ref 150–400)
RBC: 4.1 MIL/uL (ref 3.87–5.11)
RDW: 13.6 % (ref 11.5–15.5)
WBC: 8.4 10*3/uL (ref 4.0–10.5)
nRBC: 0 % (ref 0.0–0.2)

## 2022-05-24 LAB — IRON AND TIBC
Iron: 34 ug/dL (ref 28–170)
Saturation Ratios: 10 % — ABNORMAL LOW (ref 10.4–31.8)
TIBC: 348 ug/dL (ref 250–450)
UIBC: 314 ug/dL

## 2022-05-24 LAB — FERRITIN: Ferritin: 63 ng/mL (ref 11–307)

## 2022-05-30 NOTE — Progress Notes (Unsigned)
Virtual Visit via Telephone Note [facility]  I connected with Martha [name]  on 05/31/2022 at 4:45 PM by telephone and verified that I am speaking with the correct person using two identifiers.  Location: Patient: Home Provider: [facility]   I discussed the limitations, risks, security and privacy concerns of performing an evaluation and management service by telephone and the availability of in person appointments. I also discussed with the patient that there may be a patient responsible charge related to this service. The patient expressed understanding and agreed to proceed.  REASON FOR VISIT:  Follow-up for iron deficiency anemia   PRIOR THERAPY: Oral iron tablets   CURRENT THERAPY: Intermittent Venofer, last on 05/22/2021   INTERVAL HISTORY:  Martha [name] 41 y.o. female returns for routine follow-up of her iron deficiency anemia.  She was last evaluated via telemedicine visit by Rojelio Brenner PA-C on 11/22/2021.   She had a single episode of hematemesis in April 2023, which was addressed with her GI provider.  She has not had any recurrent hematemesis since that time.   No melena, hematochezia, or epistaxis.  She continues to have moderately heavy menstrual periods lasting 7 days, and following with gynecology and recently started on progesterone (6 days ago) until she can be put on an IUD.   She reports some increased fatigue with energy about 40%.  She has had recurrence of the other symptoms that she associates with her iron deficiency, specifically worsening headaches, dizziness, and cold intolerance. She denies any chest pain, dyspnea on exertion, pica, or syncopal episodes.  She reports 40% energy and 80% appetite.  She is maintaining a stable weight at this time.    OBSERVATIONS/OBJECTIVE: Review of Systems  Constitutional:  Negative for chills, diaphoresis, fever, malaise/fatigue and weight loss.  Respiratory:  Negative for cough and  shortness of breath.   Cardiovascular:  Negative for chest pain and palpitations.  Gastrointestinal:  Positive for abdominal pain (flank/ovary), nausea and vomiting. Negative for blood in stool and melena.  Neurological:  Positive for dizziness and headaches.     PHYSICAL EXAM (per limitations of virtual telephone visit): The patient is alert and oriented x 3, exhibiting adequate mentation, good mood, and ability to speak in full sentences and execute sound judgement.   ASSESSMENT & PLAN: 1.  Iron deficiency anemia: - Most likely etiology is normal blood loss from menses as well as malabsorption in the setting of chronic PPI use - Initial labs on 06/10/2020 with hemoglobin 11 and ferritin of 25. - Additional work-up from 07/21/2020 showed normal B12, methylmalonic acid, copper and folic acid levels.  SPEP was also negative. - She took oral iron for over 1 year without improvement in her levels - No history of transfusion. - EGD on 03/30/2020 with mild erosive reflux esophagitis, medium sized hiatal hernia.  Normal duodenum. - Colonoscopy on 03/30/2020 was normal.  Normal distal ileum. - Most recent IV Venofer (1000 mg) November 2022 - She reported improvement in her energy levels after IV iron infusions   - Currently reports increased fatigue, headaches, dizziness - She has moderate to heavy menses, 7 out of every 28 days, 4 of which are moderately heavy.  Single episode of hematemesis in April 2023, no recurrence.  No bleeding per rectum reported.     - Most recent labs (05/24/2022): Hgb 10.8/MCV 85.1, ferritin 63, iron saturation 10%. - PLAN: Recommend IV Venofer 300 mg x 3  - Repeat labs and RTC in  6 months for same-day labs and office visit   2.  GERD: - This is likely from hiatal hernia.  She is taking Protonix twice weekly.   3.  Social/family history: - She works as a Pension scheme manager. - Maternal first cousin had to have blood transfusions.  Maternal aunt had  nasopharyngeal cancer.   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 The patient was provided an opportunity to ask questions and all were answered. The patient agreed with the plan and demonstrated an understanding of the instructions.   The patient was advised to call back or seek an in-person evaluation if the symptoms worsen or if the condition fails to improve as anticipated.  I provided 18 minutes of non-face-to-face time during this encounter.   Carnella Guadalajara, PA-C 05/31/2022 5:40 PM

## 2022-05-31 ENCOUNTER — Inpatient Hospital Stay: Payer: BC Managed Care – PPO | Attending: Physician Assistant | Admitting: Physician Assistant

## 2022-05-31 ENCOUNTER — Other Ambulatory Visit: Payer: Self-pay

## 2022-05-31 DIAGNOSIS — N920 Excessive and frequent menstruation with regular cycle: Secondary | ICD-10-CM | POA: Diagnosis not present

## 2022-05-31 DIAGNOSIS — K219 Gastro-esophageal reflux disease without esophagitis: Secondary | ICD-10-CM | POA: Insufficient documentation

## 2022-05-31 DIAGNOSIS — D500 Iron deficiency anemia secondary to blood loss (chronic): Secondary | ICD-10-CM | POA: Insufficient documentation

## 2022-06-15 ENCOUNTER — Encounter: Payer: BC Managed Care – PPO | Admitting: Internal Medicine

## 2022-06-15 ENCOUNTER — Encounter: Payer: BC Managed Care – PPO | Admitting: Nurse Practitioner

## 2022-06-22 ENCOUNTER — Inpatient Hospital Stay: Payer: BC Managed Care – PPO

## 2022-06-22 VITALS — BP 123/86 | HR 79 | Temp 98.8°F | Resp 18

## 2022-06-22 DIAGNOSIS — D509 Iron deficiency anemia, unspecified: Secondary | ICD-10-CM

## 2022-06-22 DIAGNOSIS — K219 Gastro-esophageal reflux disease without esophagitis: Secondary | ICD-10-CM | POA: Diagnosis not present

## 2022-06-22 DIAGNOSIS — N920 Excessive and frequent menstruation with regular cycle: Secondary | ICD-10-CM | POA: Diagnosis present

## 2022-06-22 DIAGNOSIS — D500 Iron deficiency anemia secondary to blood loss (chronic): Secondary | ICD-10-CM | POA: Diagnosis present

## 2022-06-22 MED ORDER — SODIUM CHLORIDE 0.9 % IV SOLN: Freq: Once | INTRAVENOUS | Status: AC

## 2022-06-22 MED ORDER — ACETAMINOPHEN 325 MG PO TABS
650.0000 mg | ORAL_TABLET | Freq: Once | ORAL | Status: AC
  Administered 2022-06-22: 650 mg via ORAL
  Filled 2022-06-22: qty 2

## 2022-06-22 MED ORDER — LORATADINE 10 MG PO TABS
10.0000 mg | ORAL_TABLET | Freq: Once | ORAL | Status: AC
  Administered 2022-06-22: 10 mg via ORAL
  Filled 2022-06-22: qty 1

## 2022-06-22 MED ORDER — SODIUM CHLORIDE 0.9 % IV SOLN
300.0000 mg | Freq: Once | INTRAVENOUS | Status: AC
  Administered 2022-06-22: 300 mg via INTRAVENOUS
  Filled 2022-06-22: qty 300

## 2022-06-22 NOTE — Progress Notes (Signed)
Patient tolerated iron infusion with no complaints voiced.  Peripheral IV site clean and dry with good blood return noted before and after infusion.  Band aid applied.  VSS with discharge and left in satisfactory condition with no s/s of distress noted.   

## 2022-06-22 NOTE — Patient Instructions (Signed)
[facility] [place]  Discharge Instructions: Thank you for choosing [facility] to provide your oncology and hematology care.  If you have a lab appointment with the Cancer Center, please come in thru the Main Entrance and check in at the main information desk.  Wear comfortable clothing and clothing appropriate for easy access to any Portacath or PICC line.   We strive to give you quality time with your provider. You may need to reschedule your appointment if you arrive late (15 or more minutes).  Arriving late affects you and other patients whose appointments are after yours.  Also, if you miss three or more appointments without notifying the office, you may be dismissed from the clinic at the provider's discretion.      For prescription refill requests, have your pharmacy contact our office and allow 72 hours for refills to be completed.    Today you received the following Venofer, return as scheduled.   To help prevent nausea and vomiting after your treatment, we encourage you to take your nausea medication as directed.  BELOW ARE SYMPTOMS THAT SHOULD BE REPORTED IMMEDIATELY: *FEVER GREATER THAN 100.4 F (38 C) OR HIGHER *CHILLS OR SWEATING *NAUSEA AND VOMITING THAT IS NOT CONTROLLED WITH YOUR NAUSEA MEDICATION *UNUSUAL SHORTNESS OF BREATH *UNUSUAL BRUISING OR BLEEDING *URINARY PROBLEMS (pain or burning when urinating, or frequent urination) *BOWEL PROBLEMS (unusual diarrhea, constipation, pain near the anus) TENDERNESS IN MOUTH AND THROAT WITH OR WITHOUT PRESENCE OF ULCERS (sore throat, sores in mouth, or a toothache) UNUSUAL RASH, SWELLING OR PAIN  UNUSUAL VAGINAL DISCHARGE OR ITCHING   Items with * indicate a potential emergency and should be followed up as soon as possible or go to the Emergency Department if any problems should occur.  Please show the CHEMOTHERAPY ALERT CARD or IMMUNOTHERAPY ALERT CARD at check-in to the Emergency Department and triage  nurse.  Should you have questions after your visit or need to cancel or reschedule your appointment, please contact [facility] [phone]  and follow the prompts.  Office hours are 8:00 a.m. to 4:30 p.m. Monday - Friday. Please note that voicemails left after 4:00 p.m. may not be returned until the following business day.  We are closed weekends and major holidays. You have access to a nurse at all times for urgent questions. Please call the main number to the clinic [phone] and follow the prompts.  For any non-urgent questions, you may also contact your provider using MyChart. We now offer e-Visits for anyone 18 and older to request care online for non-urgent symptoms. For details visit [email or web address].   Also download the MyChart app! Go to the app store, search "MyChart", open the app, select [facility], and log in with your MyChart username and password.   

## 2022-06-29 ENCOUNTER — Inpatient Hospital Stay: Payer: BC Managed Care – PPO | Attending: Physician Assistant

## 2022-06-29 VITALS — BP 107/77 | HR 75 | Temp 98.5°F | Resp 18

## 2022-06-29 DIAGNOSIS — D500 Iron deficiency anemia secondary to blood loss (chronic): Secondary | ICD-10-CM | POA: Insufficient documentation

## 2022-06-29 DIAGNOSIS — N920 Excessive and frequent menstruation with regular cycle: Secondary | ICD-10-CM | POA: Diagnosis present

## 2022-06-29 DIAGNOSIS — D509 Iron deficiency anemia, unspecified: Secondary | ICD-10-CM

## 2022-06-29 DIAGNOSIS — K219 Gastro-esophageal reflux disease without esophagitis: Secondary | ICD-10-CM | POA: Diagnosis not present

## 2022-06-29 MED ORDER — SODIUM CHLORIDE 0.9 % IV SOLN
300.0000 mg | Freq: Once | INTRAVENOUS | Status: AC
  Administered 2022-06-29: 300 mg via INTRAVENOUS
  Filled 2022-06-29: qty 300

## 2022-06-29 MED ORDER — SODIUM CHLORIDE 0.9 % IV SOLN: Freq: Once | INTRAVENOUS | Status: AC

## 2022-06-29 NOTE — Patient Instructions (Signed)
[facility] [place]  Discharge Instructions: Thank you for choosing [facility] to provide your oncology and hematology care.  If you have a lab appointment with the Cancer Center, please come in thru the Main Entrance and check in at the main information desk.  Wear comfortable clothing and clothing appropriate for easy access to any Portacath or PICC line.   We strive to give you quality time with your provider. You may need to reschedule your appointment if you arrive late (15 or more minutes).  Arriving late affects you and other patients whose appointments are after yours.  Also, if you miss three or more appointments without notifying the office, you may be dismissed from the clinic at the provider's discretion.      For prescription refill requests, have your pharmacy contact our office and allow 72 hours for refills to be completed.    Today you received the following chemotherapy and/or immunotherapy agents Venofer      To help prevent nausea and vomiting after your treatment, we encourage you to take your nausea medication as directed.  BELOW ARE SYMPTOMS THAT SHOULD BE REPORTED IMMEDIATELY: *FEVER GREATER THAN 100.4 F (38 C) OR HIGHER *CHILLS OR SWEATING *NAUSEA AND VOMITING THAT IS NOT CONTROLLED WITH YOUR NAUSEA MEDICATION *UNUSUAL SHORTNESS OF BREATH *UNUSUAL BRUISING OR BLEEDING *URINARY PROBLEMS (pain or burning when urinating, or frequent urination) *BOWEL PROBLEMS (unusual diarrhea, constipation, pain near the anus) TENDERNESS IN MOUTH AND THROAT WITH OR WITHOUT PRESENCE OF ULCERS (sore throat, sores in mouth, or a toothache) UNUSUAL RASH, SWELLING OR PAIN  UNUSUAL VAGINAL DISCHARGE OR ITCHING   Items with * indicate a potential emergency and should be followed up as soon as possible or go to the Emergency Department if any problems should occur.  Please show the CHEMOTHERAPY ALERT CARD or IMMUNOTHERAPY ALERT CARD at check-in to the Emergency  Department and triage nurse.  Should you have questions after your visit or need to cancel or reschedule your appointment, please contact [facility] [place] [phone]  and follow the prompts.  Office hours are 8:00 a.m. to 4:30 p.m. Monday - Friday. Please note that voicemails left after 4:00 p.m. may not be returned until the following business day.  We are closed weekends and major holidays. You have access to a nurse at all times for urgent questions. Please call the main number to the clinic [phone] and follow the prompts.  For any non-urgent questions, you may also contact your provider using MyChart. We now offer e-Visits for anyone 18 and older to request care online for non-urgent symptoms. For details visit [email or web address].   Also download the MyChart app! Go to the app store, search "MyChart", open the app, select [facility], and log in with your MyChart username and password.   

## 2022-06-29 NOTE — Progress Notes (Signed)
Patient presents today for Venofer infusion per providers order.  Vital signs WNL.  Patient took premedications, Tylenol and Claritin , at home prior to appointment.  Peripheral IV started and blood return noted pre and post infusion.  Treatment given today per MD orders.  Stable during infusion without adverse affects.  Vital signs stable.  No complaints at this time.  Discharge from clinic ambulatory in stable condition.  Alert and oriented X 3.  Follow up with [facility] as scheduled.

## 2022-07-04 ENCOUNTER — Encounter: Payer: Self-pay | Admitting: Hematology

## 2022-07-05 ENCOUNTER — Encounter: Payer: BC Managed Care – PPO | Admitting: Internal Medicine

## 2022-07-05 ENCOUNTER — Encounter: Payer: Self-pay | Admitting: Internal Medicine

## 2022-07-06 ENCOUNTER — Inpatient Hospital Stay: Payer: BC Managed Care – PPO

## 2022-07-06 VITALS — BP 124/71 | HR 84 | Resp 16

## 2022-07-06 DIAGNOSIS — D500 Iron deficiency anemia secondary to blood loss (chronic): Secondary | ICD-10-CM | POA: Diagnosis not present

## 2022-07-06 DIAGNOSIS — D509 Iron deficiency anemia, unspecified: Secondary | ICD-10-CM

## 2022-07-06 MED ORDER — LORATADINE 10 MG PO TABS
10.0000 mg | ORAL_TABLET | Freq: Once | ORAL | Status: AC
  Administered 2022-07-06: 10 mg via ORAL
  Filled 2022-07-06: qty 1

## 2022-07-06 MED ORDER — SODIUM CHLORIDE 0.9 % IV SOLN: Freq: Once | INTRAVENOUS | Status: AC

## 2022-07-06 MED ORDER — ACETAMINOPHEN 325 MG PO TABS
650.0000 mg | ORAL_TABLET | Freq: Once | ORAL | Status: AC
  Administered 2022-07-06: 650 mg via ORAL
  Filled 2022-07-06: qty 2

## 2022-07-06 MED ORDER — SODIUM CHLORIDE 0.9 % IV SOLN
300.0000 mg | Freq: Once | INTRAVENOUS | Status: AC
  Administered 2022-07-06: 300 mg via INTRAVENOUS
  Filled 2022-07-06: qty 300

## 2022-07-06 NOTE — Progress Notes (Signed)
Venofer given today per MD orders. Tolerated infusion without adverse affects. Vital signs stable. No complaints at this time. Discharged from clinic ambulatory in stable condition. Alert and oriented x 3. F/U with [facility] as scheduled.  

## 2022-07-06 NOTE — Patient Instructions (Signed)
[facility]  Discharge Instructions: Thank you for choosing [facility] to provide your oncology and hematology care.  If you have a lab appointment with the [facility], please come in thru the Main Entrance and check in at the main information desk.  Wear comfortable clothing and clothing appropriate for easy access to any Portacath or PICC line.   We strive to give you quality time with your provider. You may need to reschedule your appointment if you arrive late (15 or more minutes).  Arriving late affects you and other patients whose appointments are after yours.  Also, if you miss three or more appointments without notifying the office, you may be dismissed from the clinic at the provider's discretion.      For prescription refill requests, have your pharmacy contact our office and allow 72 hours for refills to be completed.    Today you received an iron infusion (venofer)   To help prevent nausea and vomiting after your treatment, we encourage you to take your nausea medication as directed.  BELOW ARE SYMPTOMS THAT SHOULD BE REPORTED IMMEDIATELY: *FEVER GREATER THAN 100.4 F (38 C) OR HIGHER *CHILLS OR SWEATING *NAUSEA AND VOMITING THAT IS NOT CONTROLLED WITH YOUR NAUSEA MEDICATION *UNUSUAL SHORTNESS OF BREATH *UNUSUAL BRUISING OR BLEEDING *URINARY PROBLEMS (pain or burning when urinating, or frequent urination) *BOWEL PROBLEMS (unusual diarrhea, constipation, pain near the anus) TENDERNESS IN MOUTH AND THROAT WITH OR WITHOUT PRESENCE OF ULCERS (sore throat, sores in mouth, or a toothache) UNUSUAL RASH, SWELLING OR PAIN  UNUSUAL VAGINAL DISCHARGE OR ITCHING   Items with * indicate a potential emergency and should be followed up as soon as possible or go to the Emergency Department if any problems should occur.  Please show the CHEMOTHERAPY ALERT CARD or IMMUNOTHERAPY ALERT CARD at check-in to the Emergency Department and triage nurse.  Should  you have questions after your visit or need to cancel or reschedule your appointment, please contact [facility] [phone]  and follow the prompts.  Office hours are 8:00 a.m. to 4:30 p.m. Monday - Friday. Please note that voicemails left after 4:00 p.m. may not be returned until the following business day.  We are closed weekends and major holidays. You have access to a nurse at all times for urgent questions. Please call the main number to the clinic [phone] and follow the prompts.  For any non-urgent questions, you may also contact your provider using MyChart. We now offer e-Visits for anyone 61 and older to request care online for non-urgent symptoms. For details visit [email or web address].   Also download the MyChart app! Go to the app store, search "MyChart", open the app, select [facility], and log in with your MyChart username and password.

## 2022-08-01 ENCOUNTER — Other Ambulatory Visit: Payer: Self-pay | Admitting: Neurosurgery

## 2022-08-01 DIAGNOSIS — G96198 Other disorders of meninges, not elsewhere classified: Secondary | ICD-10-CM

## 2022-08-16 ENCOUNTER — Telehealth: Payer: Self-pay | Admitting: *Deleted

## 2022-08-16 ENCOUNTER — Other Ambulatory Visit: Payer: Self-pay | Admitting: *Deleted

## 2022-08-16 DIAGNOSIS — D509 Iron deficiency anemia, unspecified: Secondary | ICD-10-CM

## 2022-08-16 NOTE — Telephone Encounter (Signed)
Patient called to advise that she is having symptoms of IDA, as she is very cold and has no energy.  Will check labs on Monday and discuss results with Rebekah Pannington, PAC.

## 2022-08-17 ENCOUNTER — Encounter: Payer: Self-pay | Admitting: Hematology

## 2022-08-20 ENCOUNTER — Inpatient Hospital Stay: Payer: BC Managed Care – PPO | Attending: Physician Assistant

## 2022-08-20 ENCOUNTER — Telehealth: Payer: BC Managed Care – PPO | Admitting: Nurse Practitioner

## 2022-08-20 DIAGNOSIS — J40 Bronchitis, not specified as acute or chronic: Secondary | ICD-10-CM

## 2022-08-20 DIAGNOSIS — D509 Iron deficiency anemia, unspecified: Secondary | ICD-10-CM | POA: Insufficient documentation

## 2022-08-20 DIAGNOSIS — J4521 Mild intermittent asthma with (acute) exacerbation: Secondary | ICD-10-CM | POA: Diagnosis not present

## 2022-08-20 LAB — CBC
HCT: 38.2 % (ref 36.0–46.0)
Hemoglobin: 11.9 g/dL — ABNORMAL LOW (ref 12.0–15.0)
MCH: 26.4 pg (ref 26.0–34.0)
MCHC: 31.2 g/dL (ref 30.0–36.0)
MCV: 84.7 fL (ref 80.0–100.0)
Platelets: 234 10*3/uL (ref 150–400)
RBC: 4.51 MIL/uL (ref 3.87–5.11)
RDW: 14.5 % (ref 11.5–15.5)
WBC: 10 10*3/uL (ref 4.0–10.5)
nRBC: 0 % (ref 0.0–0.2)

## 2022-08-20 LAB — IRON AND TIBC
Iron: 45 ug/dL (ref 28–170)
Saturation Ratios: 15 % (ref 10.4–31.8)
TIBC: 303 ug/dL (ref 250–450)
UIBC: 258 ug/dL

## 2022-08-20 LAB — FERRITIN: Ferritin: 207 ng/mL (ref 11–307)

## 2022-08-20 MED ORDER — PREDNISONE 10 MG (21) PO TBPK: ORAL_TABLET | ORAL | 0 refills | Status: DC

## 2022-08-20 MED ORDER — ALBUTEROL SULFATE HFA 108 (90 BASE) MCG/ACT IN AERS: 2.0000 | INHALATION_SPRAY | Freq: Four times a day (QID) | RESPIRATORY_TRACT | 0 refills | Status: DC | PRN

## 2022-08-20 MED ORDER — DOXYCYCLINE HYCLATE 100 MG PO TABS: 100.0000 mg | ORAL_TABLET | Freq: Two times a day (BID) | ORAL | 0 refills | Status: AC

## 2022-08-20 NOTE — Progress Notes (Signed)
Virtual Visit Consent   Martha [name], you are scheduled for a virtual visit with a [facility] provider today. Just as with appointments in the office, your consent must be obtained to participate. Your consent will be active for this visit and any virtual visit you may have with one of our providers in the next 365 days. If you have a MyChart account, a copy of this consent can be sent to you electronically.  As this is a virtual visit, video technology does not allow for your provider to perform a traditional examination. This may limit your provider's ability to fully assess your condition. If your provider identifies any concerns that need to be evaluated in person or the need to arrange testing (such as labs, EKG, etc.), we will make arrangements to do so. Although advances in technology are sophisticated, we cannot ensure that it will always work on either your end or our end. If the connection with a video visit is poor, the visit may have to be switched to a telephone visit. With either a video or telephone visit, we are not always able to ensure that we have a secure connection.  By engaging in this virtual visit, you consent to the provision of healthcare and authorize for your insurance to be billed (if applicable) for the services provided during this visit. Depending on your insurance coverage, you may receive a charge related to this service.  I need to obtain your verbal consent now. Are you willing to proceed with your visit today? Martha [name] has provided verbal consent on 08/20/2022 for a virtual visit (video or telephone). Martha [name]  Date: 08/20/2022 4:58 PM  Virtual Visit via Video Note   I, Martha [name], connected with  Martha [name]  ([id number], 42 years old) on 08/20/22 at  5:00 PM EST by a video-enabled telemedicine application and verified that I am speaking with the correct person using two identifiers.  Location: Patient: Virtual Visit Location Patient:  Home Provider: Virtual Visit Location Provider: Home Office   I discussed the limitations of evaluation and management by telemedicine and the availability of in person appointments. The patient expressed understanding and agreed to proceed.    History of Present Illness: Martha [name] is a 42 y.o. who identifies as a female who was assigned female at birth, and is being seen today for cough and congestion. She has had symptoms for the past 3 weeks.   Initially had stomachache and diarrhea  Then progressed to scratchy throat and sinus congestion  She now has chest congestion, productive cough,   She has been using her Albuterol inhaler twice daily  She has been using Dayquil and Nyquil and Tussinex as well   Her symptoms today are mixed between her sinuses, pressure in ears and chest   Denies recent fever   She has had bronchitis in the past   She did test for COVID initially and was negative  Her daughter did have Flu B throughout this time     Problems:  Patient Active Problem List   Diagnosis Date Noted   Acute viral bronchitis 04/27/2022   Head ache 07/18/2021   Annual physical exam 06/14/2021   Need for Tdap vaccination 06/14/2021   Lightheadedness 06/14/2021   Hematemesis 07/26/2020   Iron deficiency anemia 07/25/2020   URI (upper respiratory infection) 07/01/2020   Lower respiratory tract infection 05/23/2020   IDA (iron deficiency anemia) 02/03/2020   Heartburn 02/03/2020   Complaint of melena 12/10/2019  Arachnoid cyst 09/11/2018   Carpal tunnel syndrome of right wrist 07/10/2018   Cubital tunnel syndrome 07/10/2018   Degeneration of lumbar intervertebral disc 07/10/2018   Low back pain 07/10/2018   Scoliosis deformity of spine 07/10/2018   Mass of joint of right wrist 07/15/2017   Iron deficiency anemia of pregnancy 06/27/2015   Dyslipidemia 10/21/2012   Prediabetes 10/21/2012   Gastroesophageal reflux disease 01/30/2011   Neck pain 08/08/2010     Allergies:  Allergies  Allergen Reactions   Penicillins Diarrhea and Nausea And Vomiting    Has patient had a PCN reaction causing immediate rash, facial/tongue/throat swelling, SOB or lightheadedness with hypotension: No Has patient had a PCN reaction causing severe rash involving mucus membranes or skin necrosis: No Has patient had a PCN reaction that required hospitalization No Has patient had a PCN reaction occurring within the last 10 years: Yes If all of the above answers are "NO", then may proceed with Cephalosporin use.    Medications:  Current Outpatient Medications:    acetaminophen (TYLENOL) 500 MG tablet, Take 500-1,000 mg by mouth every 6 (six) hours as needed (pain.)., Disp: , Rfl:    albuterol (PROVENTIL) (2.5 MG/3ML) 0.083% nebulizer solution, Take 3 mLs (2.5 mg total) by nebulization every 6 (six) hours as needed for wheezing or shortness of breath., Disp: 150 mL, Rfl: 1   albuterol (VENTOLIN HFA) 108 (90 Base) MCG/ACT inhaler, Inhale 2 puffs into the lungs every 6 (six) hours as needed for wheezing or shortness of breath., Disp: 8 g, Rfl: 0   cetirizine (ZYRTEC) 10 MG tablet, Take 10 mg by mouth daily as needed., Disp: , Rfl:    Cholecalciferol (VITAMIN D3) 25 MCG (1000 UT) CAPS, Take 1 capsule (1,000 Units total) by mouth daily., Disp: 60 capsule, Rfl: 3   dextromethorphan 15 MG/5ML syrup, Take 10 mLs (30 mg total) by mouth 4 (four) times daily as needed for cough., Disp: 120 mL, Rfl: 0   medroxyPROGESTERone (PROVERA) 10 MG tablet, Take 10 mg by mouth daily., Disp: , Rfl:    Multiple Vitamin (MULTIVITAMIN) tablet, Take 1 tablet by mouth daily., Disp: , Rfl:    pantoprazole (PROTONIX) 40 MG tablet, Take 40 mg by mouth daily as needed., Disp: , Rfl:    Respiratory Therapy Supplies (NEBULIZER MASK ADULT) MISC, 1 each by Does not apply route as needed., Disp: 1 each, Rfl: 0  Observations/Objective: Patient is well-developed, well-nourished in no acute distress.  Resting  comfortably  at home.  Head is normocephalic, atraumatic.  No labored breathing.  Speech is clear and coherent with logical content.  Patient is alert and oriented at baseline.    Assessment and Plan: 1. Bronchitis Advised Mucinex in combination with :  - doxycycline (VIBRA-TABS) 100 MG tablet; Take 1 tablet (100 mg total) by mouth 2 (two) times daily for 10 days.  Dispense: 20 tablet; Refill: 0 - predniSONE (STERAPRED UNI-PAK 21 TAB) 10 MG (21) TBPK tablet; Take 6 tablets on day one, 5 on day two, 4 on day three, 3 on day four, 2 on day five, and 1 on day six. Take with food.  Dispense: 21 tablet; Refill: 0  2. Mild intermittent asthma with acute exacerbation  - predniSONE (STERAPRED UNI-PAK 21 TAB) 10 MG (21) TBPK tablet; Take 6 tablets on day one, 5 on day two, 4 on day three, 3 on day four, 2 on day five, and 1 on day six. Take with food.  Dispense: 21 tablet; Refill: 0 - albuterol (  VENTOLIN HFA) 108 (90 Base) MCG/ACT inhaler; Inhale 2 puffs into the lungs every 6 (six) hours as needed for wheezing or shortness of breath.  Dispense: 8 g; Refill: 0     Follow Up Instructions: I discussed the assessment and treatment plan with the patient. The patient was provided an opportunity to ask questions and all were answered. The patient agreed with the plan and demonstrated an understanding of the instructions.  A copy of instructions were sent to the patient via MyChart unless otherwise noted below.    The patient was advised to call back or seek an in-person evaluation if the symptoms worsen or if the condition fails to improve as anticipated.  Time:  I spent 10 minutes with the patient via telehealth technology discussing the above problems/concerns.    Martha [name]

## 2022-08-21 ENCOUNTER — Telehealth: Payer: Self-pay | Admitting: *Deleted

## 2022-08-21 NOTE — Telephone Encounter (Signed)
Patient notified that labs from yesterday do not indicate that she needs iron at this time.  Reviewed by Tarri Abernethy PA-C and agrees.  Advised patient to notify PCP of symptoms of fatigue to assess for other cause.  Verbalized understanding.

## 2022-09-11 ENCOUNTER — Other Ambulatory Visit: Payer: Self-pay | Admitting: Nurse Practitioner

## 2022-09-11 DIAGNOSIS — J4521 Mild intermittent asthma with (acute) exacerbation: Secondary | ICD-10-CM

## 2022-10-09 ENCOUNTER — Ambulatory Visit: Payer: BC Managed Care – PPO | Admitting: Internal Medicine

## 2022-10-09 ENCOUNTER — Encounter: Payer: Self-pay | Admitting: Internal Medicine

## 2022-10-14 ENCOUNTER — Ambulatory Visit
Admission: EM | Admit: 2022-10-14 | Discharge: 2022-10-14 | Disposition: A | Discharge status: Home / Self Care | Payer: BC Managed Care – PPO | Attending: Physician Assistant | Admitting: Physician Assistant

## 2022-10-14 DIAGNOSIS — J4541 Moderate persistent asthma with (acute) exacerbation: Secondary | ICD-10-CM | POA: Diagnosis not present

## 2022-10-14 DIAGNOSIS — J329 Chronic sinusitis, unspecified: Secondary | ICD-10-CM

## 2022-10-14 DIAGNOSIS — J40 Bronchitis, not specified as acute or chronic: Secondary | ICD-10-CM

## 2022-10-14 MED ORDER — IPRATROPIUM-ALBUTEROL 0.5-2.5 (3) MG/3ML IN SOLN
3.0000 mL | Freq: Once | RESPIRATORY_TRACT | Status: AC
  Administered 2022-10-14: 3 mL via RESPIRATORY_TRACT

## 2022-10-14 MED ORDER — PREDNISONE 20 MG PO TABS: 40.0000 mg | ORAL_TABLET | Freq: Every day | ORAL | 0 refills | Status: AC

## 2022-10-14 MED ORDER — BUDESONIDE-FORMOTEROL FUMARATE 80-4.5 MCG/ACT IN AERO: 2.0000 | INHALATION_SPRAY | Freq: Two times a day (BID) | RESPIRATORY_TRACT | 1 refills | Status: AC

## 2022-10-14 MED ORDER — ALBUTEROL SULFATE HFA 108 (90 BASE) MCG/ACT IN AERS: 1.0000 | INHALATION_SPRAY | Freq: Four times a day (QID) | RESPIRATORY_TRACT | 1 refills | Status: AC | PRN

## 2022-10-14 MED ORDER — AZITHROMYCIN 250 MG PO TABS: 250.0000 mg | ORAL_TABLET | Freq: Every day | ORAL | 0 refills | Status: DC

## 2022-10-14 NOTE — Discharge Instructions (Addendum)
Start azithromycin to cover for infection.  Take albuterol every 4-6 hours as needed.  Start Symbicort twice a day.  Make sure to rinse her mouth following use of this medication in order to prevent thrush.  Take prednisone 40 mg in the morning for 4 days.  Do not take NSAIDs with this medication including aspirin, ibuprofen/Advil, naproxen/Aleve.  Use over-the-counter medication including Mucinex, Flonase, allergy medication to help manage her symptoms.  If her symptoms not improving within a week please return for reevaluation.  If anything worsens and you have worsening cough, shortness of breath, nausea/vomiting interfering with oral intake, weakness you need to be seen immediately.

## 2022-10-14 NOTE — ED Provider Notes (Signed)
[facility]    CSN: [id number] Arrival date & time: 10/14/22  1153      History   Chief Complaint No chief complaint on file.   HPI Martha [name] is a 42 y.o. female.   Patient presents today with a weeklong history of URI symptoms.  She reports nasal congestion, cough, wheezing, shortness of breath.  She did have old prescription for doxycycline leftover from previous illness that she has been taking with improvement but not resolution of symptoms.  She does have a history of asthma that required hospitalization as a child but has not required hospitalization in adulthood.  She has been using her breathing treatment and albuterol inhaler without improvement.  She does not take a maintenance medication.  Denies any recent steroids in the past 90 days.  She has tried over-the-counter medications without improvement of symptoms.  Denies any known sick contacts.  She does have a history of prediabetes but her last A1c obtained 06/14/2021 was appropriate at 5.3%.  She is confident she is not pregnant.    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   Hyperlipidemia    Iron deficiency anemia due to chronic blood loss    hematology/ oncology--- Lupe Carney [place] PA,  treated w/ iv iron   Pre-diabetes    Spinal arachnoid cyst 2020   mild dorsal spinal cord flattening @ T2-3 intradural  (per pt followed by neuro surgeon, asymptomatic)   Wears glasses     Patient Active Problem List   Diagnosis Date Noted   Acute viral bronchitis 04/27/2022   Head ache 07/18/2021   Annual physical exam 06/14/2021   Need for Tdap vaccination 06/14/2021   Lightheadedness 06/14/2021   Hematemesis 07/26/2020   Iron deficiency anemia 07/25/2020   URI (upper respiratory infection) 07/01/2020   Lower respiratory tract  infection 05/23/2020   IDA (iron deficiency anemia) 02/03/2020   Heartburn 02/03/2020   Complaint of melena 12/10/2019   Arachnoid cyst 09/11/2018   Carpal tunnel syndrome of right wrist 07/10/2018   Cubital tunnel syndrome 07/10/2018   Degeneration of lumbar intervertebral disc 07/10/2018   Low back pain 07/10/2018   Scoliosis deformity of spine 07/10/2018   Mass of joint of right wrist 07/15/2017   Iron deficiency anemia of pregnancy 06/27/2015   Dyslipidemia 10/21/2012   Prediabetes 10/21/2012   Gastroesophageal reflux disease 01/30/2011   Neck pain 08/08/2010    Past Surgical History:  Procedure Laterality Date   BIOPSY  03/30/2020   Procedure: BIOPSY;  Surgeon: Corbin Ade, MD;  Location: [facility];  Service: Endoscopy;;   COLONOSCOPY N/A 03/30/2020   normal colon, normal TI.    DIAGNOSTIC LAPAROSCOPY  09/09/2001   @[facility] ;  AND D&C HYSTEROSCOPY   DILATATION & CURETTAGE/HYSTEROSCOPY WITH MYOSURE N/A 01/31/2022   Procedure: DILATATION & CURETTAGE/HYSTEROSCOPY WITH MYOSURE;  Surgeon: [name], DO;  Location: [facility];  Service: Gynecology;  Laterality: N/A;   ESOPHAGOGASTRODUODENOSCOPY  01/2010   Dr. Jena Gauss: small hiatal hernia   ESOPHAGOGASTRODUODENOSCOPY N/A 03/30/2020   mild erosive reflux esophagitis, medium-sized hiatal hernia s/p biopsy, normal duodenum. Schatzki's ring. Negative H.pylori.    LASER ABLATION OF THE CERVIX  12/14/2003   @[facility]   (CO2 laser vaporization of CIN)   TUBAL LIGATION Bilateral 06/26/2015  Procedure: POST PARTUM TUBAL LIGATION;  Surgeon: Maxie Better, MD;  Location: [facility];  Service: Gynecology;  Laterality: Bilateral;   WRIST GANGLION EXCISION  07/22/2009       OB History     Gravida  2   Para  2   Term  2   Preterm      AB      Living  2      SAB      IAB      Ectopic      Multiple  0   Live Births  2            Home Medications    Prior to Admission medications    Medication Sig Start Date End Date Taking? Authorizing Provider  albuterol (VENTOLIN HFA) 108 (90 Base) MCG/ACT inhaler Inhale 1-2 puffs into the lungs every 6 (six) hours as needed for wheezing or shortness of breath. 10/14/22  Yes Kristene Liberati K, PA-C  azithromycin (ZITHROMAX) 250 MG tablet Take 1 tablet (250 mg total) by mouth daily. Take first 2 tablets together, then 1 every day until finished. 10/14/22  Yes Ajeet Casasola, Denny Peon K, PA-C  budesonide-formoterol (SYMBICORT) 80-4.5 MCG/ACT inhaler Inhale 2 puffs into the lungs in the morning and at bedtime. 10/14/22  Yes Jacey Eckerson K, PA-C  predniSONE (DELTASONE) 20 MG tablet Take 2 tablets (40 mg total) by mouth daily for 4 days. 10/14/22 10/18/22 Yes Ximena Todaro, Noberto Retort, PA-C  acetaminophen (TYLENOL) 500 MG tablet Take 500-1,000 mg by mouth every 6 (six) hours as needed (pain.).    [provider]  albuterol (PROVENTIL) (2.5 MG/3ML) 0.083% nebulizer solution Take 3 mLs (2.5 mg total) by nebulization every 6 (six) hours as needed for wheezing or shortness of breath. 07/05/20   Heather Roberts, NP  cetirizine (ZYRTEC) 10 MG tablet Take 10 mg by mouth daily as needed.    [provider]  Cholecalciferol (VITAMIN D3) 25 MCG (1000 UT) CAPS Take 1 capsule (1,000 Units total) by mouth daily. 06/15/21   Donell Beers, FNP  dextromethorphan 15 MG/5ML syrup Take 10 mLs (30 mg total) by mouth 4 (four) times daily as needed for cough. 04/27/22   Billie Lade, MD  Multiple Vitamin (MULTIVITAMIN) tablet Take 1 tablet by mouth daily.    [provider]  pantoprazole (PROTONIX) 40 MG tablet Take 40 mg by mouth daily as needed. 03/30/20   [provider]  Respiratory Therapy Supplies (NEBULIZER MASK ADULT) MISC 1 each by Does not apply route as needed. 04/27/22   Billie Lade, MD    Family History Family History  Problem Relation Age of Onset   Asthma Mother    Allergies Mother    Diabetes Mother        prediabetes   Myasthenia  gravis Father    Bladder Cancer Father        diagnosed at 38   Hyperlipidemia Father    Migraines Sister    Cancer Maternal Aunt        facial   Diabetes Paternal Uncle    Cancer Maternal Grandmother        stomach   Heart disease Paternal Grandmother    Heart disease Paternal Grandfather    Colon cancer Neg Hx    Inflammatory bowel disease Neg Hx    Celiac disease Neg Hx    Breast cancer Neg Hx    Lung cancer Neg Hx     Social History Social History  Tobacco Use   Smoking status: Never   Smokeless tobacco: Never  Vaping Use   Vaping Use: Never used  Substance Use Topics   Alcohol use: Yes    Comment: socially   Drug use: Never     Allergies   Penicillins   Review of Systems Review of Systems  Constitutional:  Positive for activity change. Negative for appetite change, fatigue and fever.  HENT:  Positive for congestion. Negative for sinus pressure, sneezing and sore throat.   Respiratory:  Positive for cough, chest tightness, shortness of breath and wheezing.   Cardiovascular:  Negative for chest pain.  Gastrointestinal:  Negative for abdominal pain, diarrhea, nausea and vomiting.     Physical Exam Triage Vital Signs ED Triage Vitals  Enc Vitals Group     BP 10/14/22 1300 125/79     Pulse Rate 10/14/22 1300 85     Resp 10/14/22 1300 18     Temp 10/14/22 1300 98.5 F (36.9 C)     Temp Source 10/14/22 1300 Oral     SpO2 10/14/22 1300 99 %     Weight --      Height --      Head Circumference --      Peak Flow --      Pain Score 10/14/22 1259 0     Pain Loc --      Pain Edu? --      Excl. in GC? --    No data found.  Updated Vital Signs BP 125/79 (BP Location: Right Arm)   Pulse 85   Temp 98.5 F (36.9 C) (Oral)   Resp 18   LMP 10/12/2022 (Exact Date)   SpO2 99%   Visual Acuity Right Eye Distance:   Left Eye Distance:   Bilateral Distance:    Right Eye Near:   Left Eye Near:    Bilateral Near:     Physical Exam Vitals reviewed.   Constitutional:      General: She is awake. She is not in acute distress.    Appearance: Normal appearance. She is well-developed. She is not ill-appearing.     Comments: Very pleasant female appears stated age in no acute distress sitting comfortably in exam room  HENT:     Head: Normocephalic and atraumatic.     Right Ear: Tympanic membrane, ear canal and external ear normal. Tympanic membrane is not erythematous or bulging.     Left Ear: Tympanic membrane, ear canal and external ear normal. Tympanic membrane is not erythematous or bulging.     Nose:     Right Sinus: Maxillary sinus tenderness present. No frontal sinus tenderness.     Left Sinus: Maxillary sinus tenderness and frontal sinus tenderness present.     Mouth/Throat:     Pharynx: Uvula midline. No oropharyngeal exudate or posterior oropharyngeal erythema.  Cardiovascular:     Rate and Rhythm: Normal rate and regular rhythm.     Heart sounds: Normal heart sounds, S1 normal and S2 normal. No murmur heard. Pulmonary:     Effort: Pulmonary effort is normal.     Breath sounds: Examination of the right-lower field reveals decreased breath sounds. Examination of the left-lower field reveals decreased breath sounds. Decreased breath sounds present. No wheezing, rhonchi or rales.  Psychiatric:        Behavior: Behavior is cooperative.      UC Treatments / Results  Labs (all labs ordered are listed, but only abnormal results are displayed) Labs Reviewed - No  data to display  EKG   Radiology No results found.  Procedures Procedures (including critical care time)  Medications Ordered in UC Medications  ipratropium-albuterol (DUONEB) 0.5-2.5 (3) MG/3ML nebulizer solution 3 mL (3 mLs Nebulization Given 10/14/22 1315)    Initial Impression / Assessment and Plan / UC Course  I have reviewed the triage vital signs and the nursing notes.  Pertinent labs & imaging results that were available during my care of the patient  were reviewed by me and considered in my medical decision making (see chart for details).     Patient is well-appearing, afebrile, nontoxic, nontachycardic.  No indication for viral testing as she has been symptomatic for several weeks and this would not change management.  Chest x-ray was deferred as she had significant improvement of symptoms following DuoNeb with oxygen saturation of 99%; no adventitious lung sounds on exam after DuoNeb.  Discussed that she likely has a viral infection that has progressed to sinobronchitis triggering her asthma.  Given her allergies to penicillins and recent doxycycline use will cover with azithromycin.  She was also started on Symbicort with instruction to rinse her mouth following use of this medication or to prevent thrush.  Will start prednisone burst and we discussed that she is off take NSAIDs with this medication.  Refill of albuterol was provided.  She can use over-the-counter medication including Mucinex, Flonase, Tylenol.  She is to rest and drink plenty of fluid.  If her symptoms are not improving within a few days she is to return for reevaluation.  Discussed that if she has any worsening symptoms including high fever, worsening cough, weakness, nausea/vomiting interfering with oral intake should be seen immediately.  Strict return precautions given.  Patient declined work excuse note.  Final Clinical Impressions(s) / UC Diagnoses   Final diagnoses:  Sinobronchitis  Moderate persistent asthma with acute exacerbation     Discharge Instructions      Start azithromycin to cover for infection.  Take albuterol every 4-6 hours as needed.  Start Symbicort twice a day.  Make sure to rinse her mouth following use of this medication in order to prevent thrush.  Take prednisone 40 mg in the morning for 4 days.  Do not take NSAIDs with this medication including aspirin, ibuprofen/Advil, naproxen/Aleve.  Use over-the-counter medication including Mucinex, Flonase,  allergy medication to help manage her symptoms.  If her symptoms not improving within a week please return for reevaluation.  If anything worsens and you have worsening cough, shortness of breath, nausea/vomiting interfering with oral intake, weakness you need to be seen immediately.     ED Prescriptions     Medication Sig Dispense Auth. Provider   predniSONE (DELTASONE) 20 MG tablet Take 2 tablets (40 mg total) by mouth daily for 4 days. 8 tablet Shaily Librizzi K, PA-C   budesonide-formoterol (SYMBICORT) 80-4.5 MCG/ACT inhaler Inhale 2 puffs into the lungs in the morning and at bedtime. 1 each Khaleelah Yowell K, PA-C   albuterol (VENTOLIN HFA) 108 (90 Base) MCG/ACT inhaler Inhale 1-2 puffs into the lungs every 6 (six) hours as needed for wheezing or shortness of breath. 18 g Lizzeth Meder K, PA-C   azithromycin (ZITHROMAX) 250 MG tablet Take 1 tablet (250 mg total) by mouth daily. Take first 2 tablets together, then 1 every day until finished. 6 tablet Kristinia Leavy, Noberto Retort, PA-C      PDMP not reviewed this encounter.   Jeani Hawking, PA-C 10/14/22 1340

## 2022-10-14 NOTE — ED Triage Notes (Signed)
Pt reports she is having trouble catching her breath, and cough and congestion,hoarse voice. She feels winded x 1 week.Took old antibiotc, inhaler, and neb treatment but no relief.

## 2022-11-27 ENCOUNTER — Inpatient Hospital Stay: Payer: BC Managed Care – PPO

## 2022-11-30 ENCOUNTER — Inpatient Hospital Stay: Payer: BC Managed Care – PPO | Attending: Physician Assistant

## 2022-11-30 DIAGNOSIS — D509 Iron deficiency anemia, unspecified: Secondary | ICD-10-CM | POA: Diagnosis present

## 2022-11-30 DIAGNOSIS — D500 Iron deficiency anemia secondary to blood loss (chronic): Secondary | ICD-10-CM

## 2022-11-30 LAB — IRON AND TIBC
Iron: 56 ug/dL (ref 28–170)
Saturation Ratios: 18 % (ref 10.4–31.8)
TIBC: 311 ug/dL (ref 250–450)
UIBC: 255 ug/dL

## 2022-11-30 LAB — CBC WITH DIFFERENTIAL/PLATELET
Abs Immature Granulocytes: 0.04 10*3/uL (ref 0.00–0.07)
Basophils Absolute: 0 10*3/uL (ref 0.0–0.1)
Basophils Relative: 0 %
Eosinophils Absolute: 0.1 10*3/uL (ref 0.0–0.5)
Eosinophils Relative: 1 %
HCT: 40.7 % (ref 36.0–46.0)
Hemoglobin: 12.7 g/dL (ref 12.0–15.0)
Immature Granulocytes: 0 %
Lymphocytes Relative: 24 %
Lymphs Abs: 2.4 10*3/uL (ref 0.7–4.0)
MCH: 26.8 pg (ref 26.0–34.0)
MCHC: 31.2 g/dL (ref 30.0–36.0)
MCV: 85.9 fL (ref 80.0–100.0)
Monocytes Absolute: 0.6 10*3/uL (ref 0.1–1.0)
Monocytes Relative: 6 %
Neutro Abs: 6.7 10*3/uL (ref 1.7–7.7)
Neutrophils Relative %: 69 %
Platelets: 228 10*3/uL (ref 150–400)
RBC: 4.74 MIL/uL (ref 3.87–5.11)
RDW: 13.1 % (ref 11.5–15.5)
WBC: 9.9 10*3/uL (ref 4.0–10.5)
nRBC: 0 % (ref 0.0–0.2)

## 2022-11-30 LAB — FERRITIN: Ferritin: 206 ng/mL (ref 11–307)

## 2022-12-03 NOTE — Progress Notes (Unsigned)
[facility] [address], [place] [address]   CLINIC:  Medical Oncology/Hematology  PCP:  Billie Lade, MD [address] [place] [address] [phone]   REASON FOR VISIT:  Follow-up for iron deficiency anemia   PRIOR THERAPY: Oral iron tablets   CURRENT THERAPY: Intermittent Venofer  INTERVAL HISTORY:   [name] 42 y.o. female returns for routine follow-up of iron deficiency anemia.  She was last evaluated via telemedicine visit by Rojelio Brenner PA-C on 05/31/2022.  At today's visit, she reports feeling increasingly tired and dizzy.  She has intermittent headaches, that she attributes to not drinking enough water and eating too many sweets.  No significant cold intolerance.  She denies any chest pain, dyspnea on exertion, pica, or syncopal episodes.    She had IUD placed several months ago to see if this would improve her menorrhagia.  She reports that she is now having moderately heavy menstrual cycle that lasts 15 days, with only 9 to 10 days in between cycles.  She is meeting with gynecology to see if IUD can be removed and if she can try another option.  She has not had any recurrent hematemesis since isolated episode in April 2023.  No melena, hematochezia, or epistaxis.  She has been taking liquid iron for the past 2 weeks.   She has 40% energy and 100% appetite. She endorses that she is maintaining a stable weight.  ASSESSMENT & PLAN:  1.  Iron deficiency anemia: - Most likely etiology is normal blood loss from menses as well as malabsorption in the setting of chronic PPI use - Initial labs on 06/10/2020 with hemoglobin 11 and ferritin of 25. - Additional work-up from 07/21/2020 showed normal B12, methylmalonic acid, copper and folic acid levels.  SPEP was also negative. - She took oral iron for over 1 year without improvement in her levels - No history of transfusion. - EGD on 03/30/2020 with mild erosive reflux esophagitis, medium  sized hiatal hernia.  Normal duodenum. - Colonoscopy on 03/30/2020 was normal.  Normal distal ileum. - Most recent IV Venofer (300 mg x 3) from 06/22/2022 through 07/06/2022 - She reported improvement in her energy levels after IV iron infusions - Currently reports increased fatigue, headaches, dizziness - She has heavy menses, following with GYN.  Single episode of hematemesis in April 2023, no recurrence.  No bleeding per rectum reported.     - Most recent labs (11/30/2022): Hgb 12.7/MCV 85.9, ferritin 206, iron saturation 18%. - PLAN: No indication for IV iron at this time. - Continue daily liquid iron. - Labs and phone visit in 6 months    2.  GERD: - This is likely from hiatal hernia.  She is taking Protonix twice weekly.  3.  Healthy lifestyle discussion - We discussed importance of cutting back on sugary treats and drinking more water   4.  Social/family history: - She works as a Pension scheme manager. - Maternal first cousin had to have blood transfusions.  Maternal aunt had nasopharyngeal cancer.  PLAN SUMMARY: >> Labs in 6 months = CBC/D, ferritin, iron/TIBC >> PHONE visit in 6 months, after labs     REVIEW OF SYSTEMS:   Review of Systems  Constitutional:  Positive for fatigue. Negative for appetite change, chills, diaphoresis, fever and unexpected weight change.  HENT:   Negative for lump/mass and nosebleeds.   Eyes:  Negative for eye problems.  Respiratory:  Negative for cough, hemoptysis and shortness  of breath.   Cardiovascular:  Negative for chest pain, leg swelling and palpitations.  Gastrointestinal:  Positive for nausea and vomiting. Negative for abdominal pain, blood in stool, constipation and diarrhea.  Genitourinary:  Negative for hematuria.   Skin: Negative.   Neurological:  Positive for headaches and light-headedness. Negative for dizziness.  Hematological:  Does not bruise/bleed easily.  Psychiatric/Behavioral:  Positive for sleep disturbance.       PHYSICAL EXAM:  ECOG PERFORMANCE STATUS: 1 - Symptomatic but completely ambulatory  There were no vitals filed for this visit. There were no vitals filed for this visit. Physical Exam Constitutional:      Appearance: Normal appearance. She is obese.  Cardiovascular:     Heart sounds: Normal heart sounds.  Pulmonary:     Breath sounds: Normal breath sounds.  Neurological:     General: No focal deficit present.     Mental Status: Mental status is at baseline.  Psychiatric:        Behavior: Behavior normal. Behavior is cooperative.     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    DIAGNOSTIC LAPAROSCOPY  09/09/2001   @[facility] ;  AND D&C HYSTEROSCOPY   DILATATION & CURETTAGE/HYSTEROSCOPY WITH MYOSURE N/A 01/31/2022   Procedure: DILATATION & CURETTAGE/HYSTEROSCOPY WITH MYOSURE;  Surgeon: [name], DO;  Location: [facility];  Service: Gynecology;  Laterality: N/A;   ESOPHAGOGASTRODUODENOSCOPY  01/2010   Dr. Jena Gauss: small hiatal hernia   ESOPHAGOGASTRODUODENOSCOPY N/A 03/30/2020   mild erosive reflux esophagitis, medium-sized hiatal hernia s/p biopsy, normal duodenum. Schatzki's ring. Negative H.pylori.    LASER ABLATION OF THE  CERVIX  12/14/2003   @[facility]   (CO2 laser vaporization of CIN)   TUBAL LIGATION Bilateral 06/26/2015   Procedure: POST PARTUM TUBAL LIGATION;  Surgeon: Maxie Better, MD;  Location: [facility];  Service: Gynecology;  Laterality: Bilateral;   WRIST GANGLION EXCISION  07/22/2009   @[facility]     SOCIAL HISTORY:  Social History   Socioeconomic History   Marital status: Married    Spouse name: Hunter    Number of children: 2   Years of education: Not on file   Highest education level: Bachelor's degree (e.g., BA, AB, BS)  Occupational History   Occupation: Runner, broadcasting/film/video    Comment: special education  Tobacco Use   Smoking status: Never   Smokeless tobacco: Never  Vaping Use   Vaping Use: Never used  Substance and Sexual Activity   Alcohol use: Yes    Comment: socially   Drug use: Never   Sexual activity: Yes    Birth control/protection: Surgical  Other Topics Concern   Not on file  Social History Narrative  Lives with Hunter-husband 11 years and children: Aspen 7 and Rhyland 5      Enjoys: cooking, teaching      Diet: eats all food groups- no pork   Caffeine: 1 cup of coffee, tea daily   Water: 4-5 daily         Wears seat belt   Does not use phone while driving   Psychologist, sport and exercise at home   [employer]             Social Determinants of Health   Financial Resource Strain: Low Risk  (12/04/2019)   Overall Financial Resource Strain (CARDIA)    Difficulty of Paying Living Expenses: Not hard at all  Food Insecurity: No Food Insecurity (12/04/2019)   Hunger Vital Sign    Worried About Running Out of Food in the Last Year: Never true    Ran Out of Food in the Last Year: Never true  Transportation Needs: No Transportation Needs (12/04/2019)   PRAPARE - Administrator, Civil Service (Medical): No    Lack of Transportation (Non-Medical): No  Physical Activity: Insufficiently Active (12/04/2019)   Exercise Vital Sign    Days of Exercise per Week: 2 days    Minutes of  Exercise per Session: 30 min  Stress: No Stress Concern Present (12/04/2019)   [employer] of Occupational Health - Occupational Stress Questionnaire    Feeling of Stress : Only a little  Social Connections: Moderately Integrated (12/04/2019)   Social Connection and Isolation Panel [NHANES]    Frequency of Communication with Friends and Family: More than three times a week    Frequency of Social Gatherings with Friends and Family: Once a week    Attends Religious Services: More than 4 times per year    Active Member of [employer] or Organizations: No    Attends Banker Meetings: Never    Marital Status: Married  Catering manager Violence: Not At Risk (12/04/2019)   Humiliation, Afraid, Rape, and Kick questionnaire    Fear of Current or Ex-Partner: No    Emotionally Abused: No    Physically Abused: No    Sexually Abused: No    FAMILY HISTORY:  Family History  Problem Relation Age of Onset   Asthma Mother    Allergies Mother    Diabetes Mother        prediabetes   Myasthenia gravis Father    Bladder Cancer Father        diagnosed at 30   Hyperlipidemia Father    Migraines Sister    Cancer Maternal Aunt        facial   Diabetes Paternal Uncle    Cancer Maternal Grandmother        stomach   Heart disease Paternal Grandmother    Heart disease Paternal Grandfather    Colon cancer Neg Hx    Inflammatory bowel disease Neg Hx    Celiac disease Neg Hx    Breast cancer Neg Hx    Lung cancer Neg Hx     CURRENT MEDICATIONS:  Outpatient Encounter Medications as of 12/04/2022  Medication Sig Note   acetaminophen (TYLENOL) 500 MG tablet Take 500-1,000 mg by mouth every 6 (six) hours as needed (pain.).    albuterol (PROVENTIL) (2.5 MG/3ML) 0.083% nebulizer solution Take 3 mLs (2.5 mg total) by nebulization every 6 (six) hours as needed for wheezing or shortness of breath.    albuterol (VENTOLIN HFA) 108 (90 Base) MCG/ACT inhaler Inhale 1-2 puffs into  the lungs every 6  (six) hours as needed for wheezing or shortness of breath.    azithromycin (ZITHROMAX) 250 MG tablet Take 1 tablet (250 mg total) by mouth daily. Take first 2 tablets together, then 1 every day until finished.    budesonide-formoterol (SYMBICORT) 80-4.5 MCG/ACT inhaler Inhale 2 puffs into the lungs in the morning and at bedtime.    cetirizine (ZYRTEC) 10 MG tablet Take 10 mg by mouth daily as needed.    Cholecalciferol (VITAMIN D3) 25 MCG (1000 UT) CAPS Take 1 capsule (1,000 Units total) by mouth daily.    dextromethorphan 15 MG/5ML syrup Take 10 mLs (30 mg total) by mouth 4 (four) times daily as needed for cough.    Multiple Vitamin (MULTIVITAMIN) tablet Take 1 tablet by mouth daily.    pantoprazole (PROTONIX) 40 MG tablet Take 40 mg by mouth daily as needed. 11/22/2021: Takes PRN   Respiratory Therapy Supplies (NEBULIZER MASK ADULT) MISC 1 each by Does not apply route as needed.    No facility-administered encounter medications on file as of 12/04/2022.    ALLERGIES:  Allergies  Allergen Reactions   Penicillins Diarrhea and Nausea And Vomiting    Has patient had a PCN reaction causing immediate rash, facial/tongue/throat swelling, SOB or lightheadedness with hypotension: No Has patient had a PCN reaction causing severe rash involving mucus membranes or skin necrosis: No Has patient had a PCN reaction that required hospitalization No Has patient had a PCN reaction occurring within the last 10 years: Yes If all of the above answers are "NO", then may proceed with Cephalosporin use.     LABORATORY DATA:  I have reviewed the labs as listed.  CBC    Component Value Date/Time   WBC 9.9 11/30/2022 1315   RBC 4.74 11/30/2022 1315   HGB 12.7 11/30/2022 1315   HGB 11.3 09/26/2021 1134   HCT 40.7 11/30/2022 1315   HCT 34.3 09/26/2021 1134   PLT 228 11/30/2022 1315   PLT 219 09/26/2021 1134   MCV 85.9 11/30/2022 1315   MCV 83 09/26/2021 1134   MCH 26.8 11/30/2022 1315   MCHC 31.2  11/30/2022 1315   RDW 13.1 11/30/2022 1315   RDW 12.4 09/26/2021 1134   LYMPHSABS 2.4 11/30/2022 1315   LYMPHSABS 2.5 09/26/2021 1134   MONOABS 0.6 11/30/2022 1315   EOSABS 0.1 11/30/2022 1315   EOSABS 0.1 09/26/2021 1134   BASOSABS 0.0 11/30/2022 1315   BASOSABS 0.0 09/26/2021 1134      Latest Ref Rng & Units 06/14/2021    8:56 AM 02/26/2020    9:00 AM 01/30/2011   11:03 AM  CMP  Glucose 70 - 99 mg/dL 80  77  84   BUN 6 - 20 mg/dL 8  11  8    Creatinine 0.57 - 1.00 mg/dL 4.09  8.11  9.14   Sodium 134 - 144 mmol/L 140  139  138   Potassium 3.5 - 5.2 mmol/L 4.9  4.3  4.6   Chloride 96 - 106 mmol/L 104  106  105   CO2 20 - 29 mmol/L 25  26  24    Calcium 8.7 - 10.2 mg/dL 9.5  9.4  9.5   Total Protein 6.0 - 8.5 g/dL 7.2  7.2    Total Bilirubin 0.0 - 1.2 mg/dL <7.8  0.4    Alkaline Phos 44 - 121 IU/L 104     AST 0 - 40 IU/L 17  17    ALT 0 - 32 IU/L  21  14      DIAGNOSTIC IMAGING:  I have independently reviewed the relevant imaging and discussed with the patient.   WRAP UP:  All questions were answered. The patient knows to call the clinic with any problems, questions or concerns.  Medical decision making:Low  Time spent on visit: I spent 15 minutes counseling the patient face to face. The total time spent in the appointment was 22 minutes and more than 50% was on counseling.  Carnella Guadalajara, PA-C  12/04/22 3:21 PM

## 2022-12-04 ENCOUNTER — Inpatient Hospital Stay (HOSPITAL_BASED_OUTPATIENT_CLINIC_OR_DEPARTMENT_OTHER): Payer: BC Managed Care – PPO | Admitting: Physician Assistant

## 2022-12-04 VITALS — BP 109/74 | HR 89 | Temp 98.1°F | Resp 16 | Wt 210.8 lb

## 2022-12-04 DIAGNOSIS — D509 Iron deficiency anemia, unspecified: Secondary | ICD-10-CM | POA: Diagnosis not present

## 2022-12-04 DIAGNOSIS — D500 Iron deficiency anemia secondary to blood loss (chronic): Secondary | ICD-10-CM | POA: Diagnosis not present

## 2022-12-04 NOTE — Patient Instructions (Signed)
[facility] at [facility] **VISIT SUMMARY & IMPORTANT INSTRUCTIONS **   You were seen today by Rojelio Brenner PA-C for your iron deficiency anemia.    IRON DEFICIENCY ANEMIA Your blood and iron levels currently look great! You do not need any IV iron at this time. We will recheck your labs and discuss results with a phone visit in 6 months.  ** Thank you for trusting me with your healthcare!  I strive to provide all of my patients with quality care at each visit.  If you receive a survey for this visit, I would be so grateful to you for taking the time to provide feedback.  Thank you in advance!  ~ Sharnita Bogucki                   Dr. Doreatha Massed   &   Rojelio Brenner, PA-C   - - - - - - - - - - - - - - - - - -    Thank you for choosing [facility] at [facility] to provide your oncology and hematology care.  To afford each patient quality time with our provider, please arrive at least 15 minutes before your scheduled appointment time.   If you have a lab appointment with the Cancer Center please come in thru the Main Entrance and check in at the main information desk.  You need to re-schedule your appointment should you arrive 10 or more minutes late.  We strive to give you quality time with our providers, and arriving late affects you and other patients whose appointments are after yours.  Also, if you no show three or more times for appointments you may be dismissed from the clinic at the providers discretion.     Again, thank you for choosing [facility].  Our hope is that these requests will decrease the amount of time that you wait before being seen by our physicians.       _____________________________________________________________  Should you have questions after your visit to [facility], please contact our office at [phone] and follow the prompts.  Our office hours are 8:00 a.m. and 4:30 p.m.  Monday - Friday.  Please note that voicemails left after 4:00 p.m. may not be returned until the following business day.  We are closed weekends and major holidays.  You do have access to a nurse 24-7, just call the main number to the clinic [phone] and do not press any options, hold on the line and a nurse will answer the phone.    For prescription refill requests, have your pharmacy contact our office and allow 72 hours.

## 2022-12-06 ENCOUNTER — Other Ambulatory Visit: Payer: BC Managed Care – PPO

## 2022-12-18 ENCOUNTER — Telehealth: Payer: Self-pay | Admitting: Internal Medicine

## 2022-12-18 NOTE — Telephone Encounter (Signed)
Patient called asking can she switch providers to a female. Reason: patient feels more comfortable with female than female provider. Patient call back # [phone].

## 2022-12-19 NOTE — Telephone Encounter (Signed)
I am okay with accepting her

## 2022-12-19 NOTE — Telephone Encounter (Signed)
Current PCP - Durwin Nora Preferred PCP - [place]  Patient last seen by [facility] in Nov 2023; however, patient prefers a female provider and is requesting a transfer of care. Preferred PCP - please route msg to front office if agreeable

## 2022-12-19 NOTE — Telephone Encounter (Signed)
Patient asked for a call back if this gets approved or not. [phone]

## 2022-12-20 NOTE — Telephone Encounter (Signed)
Patient scheduled an appointment with Tenna Child

## 2023-01-14 ENCOUNTER — Ambulatory Visit (INDEPENDENT_AMBULATORY_CARE_PROVIDER_SITE_OTHER): Payer: BC Managed Care – PPO | Admitting: Family Medicine

## 2023-01-14 ENCOUNTER — Ambulatory Visit: Payer: BC Managed Care – PPO | Admitting: Family Medicine

## 2023-01-14 ENCOUNTER — Encounter: Payer: Self-pay | Admitting: Family Medicine

## 2023-01-14 VITALS — BP 107/73 | HR 85 | Ht 66.0 in | Wt 201.0 lb

## 2023-01-14 DIAGNOSIS — G479 Sleep disorder, unspecified: Secondary | ICD-10-CM

## 2023-01-14 DIAGNOSIS — F5102 Adjustment insomnia: Secondary | ICD-10-CM | POA: Diagnosis not present

## 2023-01-14 DIAGNOSIS — Z0001 Encounter for general adult medical examination with abnormal findings: Secondary | ICD-10-CM | POA: Diagnosis not present

## 2023-01-14 DIAGNOSIS — G4700 Insomnia, unspecified: Secondary | ICD-10-CM | POA: Insufficient documentation

## 2023-01-14 DIAGNOSIS — Z131 Encounter for screening for diabetes mellitus: Secondary | ICD-10-CM

## 2023-01-14 DIAGNOSIS — Z3202 Encounter for pregnancy test, result negative: Secondary | ICD-10-CM | POA: Insufficient documentation

## 2023-01-14 DIAGNOSIS — N9489 Other specified conditions associated with female genital organs and menstrual cycle: Secondary | ICD-10-CM | POA: Insufficient documentation

## 2023-01-14 DIAGNOSIS — N939 Abnormal uterine and vaginal bleeding, unspecified: Secondary | ICD-10-CM | POA: Insufficient documentation

## 2023-01-14 DIAGNOSIS — G96198 Other disorders of meninges, not elsewhere classified: Secondary | ICD-10-CM | POA: Insufficient documentation

## 2023-01-14 DIAGNOSIS — Z1231 Encounter for screening mammogram for malignant neoplasm of breast: Secondary | ICD-10-CM

## 2023-01-14 DIAGNOSIS — Z13220 Encounter for screening for lipoid disorders: Secondary | ICD-10-CM

## 2023-01-14 DIAGNOSIS — Z1159 Encounter for screening for other viral diseases: Secondary | ICD-10-CM

## 2023-01-14 DIAGNOSIS — Z1329 Encounter for screening for other suspected endocrine disorder: Secondary | ICD-10-CM

## 2023-01-14 DIAGNOSIS — N870 Mild cervical dysplasia: Secondary | ICD-10-CM | POA: Insufficient documentation

## 2023-01-14 MED ORDER — PANTOPRAZOLE SODIUM 40 MG PO TBEC: 40.0000 mg | DELAYED_RELEASE_TABLET | Freq: Every day | ORAL | 3 refills | Status: AC | PRN

## 2023-01-14 MED ORDER — HYDROXYZINE HCL 10 MG PO TABS: 10.0000 mg | ORAL_TABLET | Freq: Every evening | ORAL | 0 refills | Status: DC | PRN

## 2023-01-14 NOTE — Patient Instructions (Signed)

## 2023-01-14 NOTE — Assessment & Plan Note (Signed)
Trial on Hydroxyzine 10 mg PRN at bedtime Explained to go to bed at the same time each night and get up at the same time each morning, including on the weekends. Make sure your bedroom is quiet, dark, relaxing, and at a comfortable temperature. Remove electronic devices, such as TVs, computers, and smart phones, from the bedroom.

## 2023-01-14 NOTE — Progress Notes (Deleted)
New Patient Office Visit   Subjective   Patient ID: POPPY MCAFEE, female    DOB: 1981-05-18  Age: 42 y.o. MRN: [id number]  CC:  Chief Complaint  Patient presents with   Follow-up   Annual Exam    HPI Aleesha ZAMERIA VOGL 42 year old female, presents to establish care. She  has a past medical history of Abnormal uterine bleeding (AUB), Asthma, GERD (gastroesophageal reflux disease), Hiatal hernia, History of cervical dysplasia (2005), Hyperlipidemia, Iron deficiency anemia due to chronic blood loss, Pre-diabetes, Spinal arachnoid cyst (2020), and Wears glasses.  HPI    Outpatient Encounter Medications as of 01/14/2023  Medication Sig   acetaminophen (TYLENOL) 500 MG tablet Take 500-1,000 mg by mouth every 6 (six) hours as needed (pain.).   albuterol (PROVENTIL) (2.5 MG/3ML) 0.083% nebulizer solution Take 3 mLs (2.5 mg total) by nebulization every 6 (six) hours as needed for wheezing or shortness of breath.   albuterol (VENTOLIN HFA) 108 (90 Base) MCG/ACT inhaler Inhale 1-2 puffs into the lungs every 6 (six) hours as needed for wheezing or shortness of breath.   budesonide-formoterol (SYMBICORT) 80-4.5 MCG/ACT inhaler Inhale 2 puffs into the lungs in the morning and at bedtime.   cetirizine (ZYRTEC) 10 MG tablet Take 10 mg by mouth daily as needed.   Cholecalciferol (VITAMIN D3) 25 MCG (1000 UT) CAPS Take 1 capsule (1,000 Units total) by mouth daily.   estradiol (VIVELLE-DOT) 0.025 MG/24HR 1 patch 2 (two) times a week.   Levonorgestrel (MIRENA, 52 MG, IU) Mirena   Multiple Vitamin (MULTIVITAMIN) tablet Take 1 tablet by mouth daily.   pantoprazole (PROTONIX) 40 MG tablet Take 40 mg by mouth daily as needed.   Respiratory Therapy Supplies (NEBULIZER MASK ADULT) MISC 1 each by Does not apply route as needed.   azithromycin (ZITHROMAX) 250 MG tablet Take 1 tablet (250 mg total) by mouth daily. Take first 2 tablets together, then 1 every day until finished.   dextromethorphan 15 MG/5ML syrup  Take 10 mLs (30 mg total) by mouth 4 (four) times daily as needed for cough.   No facility-administered encounter medications on file as of 01/14/2023.    Past Surgical History:  Procedure Laterality Date   BIOPSY  03/30/2020   Procedure: BIOPSY;  Surgeon: Corbin Ade, MD;  [facility];  Service: Endoscopy;;   COLONOSCOPY N/A 03/30/2020   normal colon, normal TI.    DIAGNOSTIC LAPAROSCOPY  09/09/2001   @[facility] ;  AND D&C HYSTEROSCOPY   DILATATION & CURETTAGE/HYSTEROSCOPY WITH MYOSURE N/A 01/31/2022   Procedure: DILATATION & CURETTAGE/HYSTEROSCOPY WITH MYOSURE;  Surgeon: [name], DO;  Location: [facility];  Service: Gynecology;  Laterality: N/A;   ESOPHAGOGASTRODUODENOSCOPY  01/2010   Dr. Jena Gauss: small hiatal hernia   ESOPHAGOGASTRODUODENOSCOPY N/A 03/30/2020   mild erosive reflux esophagitis, medium-sized hiatal hernia s/p biopsy, normal duodenum. Schatzki's ring. Negative H.pylori.    LASER ABLATION OF THE CERVIX  12/14/2003   @[facility]   (CO2 laser vaporization of CIN)   TUBAL LIGATION Bilateral 06/26/2015   Procedure: POST PARTUM TUBAL LIGATION;  Surgeon: Maxie Better, MD;  Location: [facility];  Service: Gynecology;  Laterality: Bilateral;   WRIST GANGLION EXCISION  07/22/2009   @[facility]     ROS    Objective    BP 107/73   Pulse 85   Ht 5\' 6"  (1.676 m)   Wt 201 lb (91.2 kg)   SpO2 98%   BMI 32.44 kg/m   Physical Exam    Assessment &  Plan:  There are no diagnoses linked to this encounter.  No follow-ups on file.   Cruzita Lederer Newman Nip, FNP

## 2023-01-14 NOTE — Progress Notes (Signed)
Complete physical exam  Patient: Martha [name]   DOB: 07-Nov-1980   42 y.o. Female  MRN: [id number]  Subjective:    Chief Complaint  Patient presents with   Follow-up   Annual Exam    Martha [name] is a 42 y.o. female who presents today for a complete physical exam. She reports consuming a  carnivore  diet. Home exercise routine includes 30-45 minutes work out with weights 3-4 days a week. She generally feels well. She reports sleeping poorly about 4-5 hours at night related to work stress. . She does have additional problems to discuss today.    Most recent fall risk assessment:    01/14/2023    9:08 AM  Fall Risk   Falls in the past year? 0  Number falls in past yr: 0  Injury with Fall? 0  Risk for fall due to : No Fall Risks  Follow up Falls evaluation completed     Most recent depression screenings:    01/14/2023    9:08 AM 04/27/2022    8:12 AM  PHQ 2/9 Scores  PHQ - 2 Score 1 0  PHQ- 9 Score 5     Vision:Within last year and Dental: No current dental problems and Receives regular dental care  Patient Care Team: Billie Lade, MD as PCP - General (Internal Medicine)   Outpatient Medications Prior to Visit  Medication Sig   acetaminophen (TYLENOL) 500 MG tablet Take 500-1,000 mg by mouth every 6 (six) hours as needed (pain.).   albuterol (PROVENTIL) (2.5 MG/3ML) 0.083% nebulizer solution Take 3 mLs (2.5 mg total) by nebulization every 6 (six) hours as needed for wheezing or shortness of breath.   albuterol (VENTOLIN HFA) 108 (90 Base) MCG/ACT inhaler Inhale 1-2 puffs into the lungs every 6 (six) hours as needed for wheezing or shortness of breath.   budesonide-formoterol (SYMBICORT) 80-4.5 MCG/ACT inhaler Inhale 2 puffs into the lungs in the morning and at bedtime.   Cholecalciferol (VITAMIN D3) 25 MCG (1000 UT) CAPS Take 1 capsule (1,000 Units total) by mouth daily.   estradiol (VIVELLE-DOT) 0.025 MG/24HR 1 patch 2 (two) times a week.   Levonorgestrel (MIRENA,  52 MG, IU) Mirena   Multiple Vitamin (MULTIVITAMIN) tablet Take 1 tablet by mouth daily.   Respiratory Therapy Supplies (NEBULIZER MASK ADULT) MISC 1 each by Does not apply route as needed.   [DISCONTINUED] cetirizine (ZYRTEC) 10 MG tablet Take 10 mg by mouth daily as needed.   [DISCONTINUED] pantoprazole (PROTONIX) 40 MG tablet Take 40 mg by mouth daily as needed.   dextromethorphan 15 MG/5ML syrup Take 10 mLs (30 mg total) by mouth 4 (four) times daily as needed for cough.   [DISCONTINUED] azithromycin (ZITHROMAX) 250 MG tablet Take 1 tablet (250 mg total) by mouth daily. Take first 2 tablets together, then 1 every day until finished.   No facility-administered medications prior to visit.    Review of Systems  Constitutional:  Negative for chills and fever.  Eyes:  Negative for blurred vision.  Respiratory:  Negative for shortness of breath.   Cardiovascular:  Negative for chest pain.  Gastrointestinal:  Negative for nausea and vomiting.  Genitourinary:  Negative for dysuria.  Musculoskeletal:  Negative for myalgias.  Neurological:  Negative for dizziness and headaches.  Psychiatric/Behavioral:  The patient has insomnia.        Objective:    BP 107/73   Pulse 85   Ht 5\' 6"  (1.676 m)   Wt 201 lb (  91.2 kg)   SpO2 98%   BMI 32.44 kg/m  BP Readings from Last 3 Encounters:  01/14/23 107/73  12/04/22 109/74  10/14/22 125/79      Physical Exam Vitals reviewed.  Constitutional:      General: She is not in acute distress.    Appearance: Normal appearance. She is not ill-appearing, toxic-appearing or diaphoretic.  HENT:     Head: Normocephalic.     Right Ear: Tympanic membrane normal.     Left Ear: Tympanic membrane normal.     Nose: Nose normal.     Mouth/Throat:     Mouth: Mucous membranes are moist.  Eyes:     General:        Right eye: No discharge.        Left eye: No discharge.     Conjunctiva/sclera: Conjunctivae normal.  Cardiovascular:     Rate and Rhythm:  Normal rate.     Pulses: Normal pulses.     Heart sounds: Normal heart sounds.  Pulmonary:     Effort: Pulmonary effort is normal. No respiratory distress.     Breath sounds: Normal breath sounds.  Abdominal:     General: Bowel sounds are normal.     Palpations: Abdomen is soft.     Tenderness: There is no abdominal tenderness. There is no right CVA tenderness, left CVA tenderness or guarding.  Musculoskeletal:        General: Normal range of motion.     Cervical back: Normal range of motion.  Skin:    General: Skin is warm and dry.     Capillary Refill: Capillary refill takes less than 2 seconds.  Neurological:     General: No focal deficit present.     Mental Status: She is alert and oriented to person, place, and time.     Coordination: Coordination normal.     Gait: Gait normal.  Psychiatric:        Mood and Affect: Mood normal.        Behavior: Behavior normal.      Results for orders placed or performed in visit on 01/14/23  CMP14+EGFR  Result Value Ref Range   Glucose 79 70 - 99 mg/dL   BUN 12 6 - 24 mg/dL   Creatinine, Ser 3.87 0.57 - 1.00 mg/dL   eGFR 78 >56 EP/PIR/5.18   BUN/Creatinine Ratio 13 9 - 23   Sodium 141 134 - 144 mmol/L   Potassium 4.5 3.5 - 5.2 mmol/L   Chloride 103 96 - 106 mmol/L   CO2 22 20 - 29 mmol/L   Calcium 9.6 8.7 - 10.2 mg/dL   Total Protein 7.4 6.0 - 8.5 g/dL   Albumin 4.5 3.9 - 4.9 g/dL   Globulin, Total 2.9 1.5 - 4.5 g/dL   Bilirubin Total 0.2 0.0 - 1.2 mg/dL   Alkaline Phosphatase 87 44 - 121 IU/L   AST 19 0 - 40 IU/L   ALT 21 0 - 32 IU/L  Hemoglobin A1c  Result Value Ref Range   Hgb A1c MFr Bld 5.4 4.8 - 5.6 %   Est. average glucose Bld gHb Est-mCnc 108 mg/dL  Lipid panel  Result Value Ref Range   Cholesterol, Total 213 (H) 100 - 199 mg/dL   Triglycerides 54 0 - 149 mg/dL   HDL 51 >84 mg/dL   VLDL Cholesterol Cal 9 5 - 40 mg/dL   LDL Chol Calc (NIH) 166 (H) 0 - 99 mg/dL   Chol/HDL Ratio 4.2 0.0 -  4.4 ratio  TSH + free T4   Result Value Ref Range   TSH 1.860 0.450 - 4.500 uIU/mL   Free T4 1.14 0.82 - 1.77 ng/dL  Hepatitis C Antibody  Result Value Ref Range   Hep C Virus Ab Non Reactive Non Reactive      Assessment & Plan:    Routine Health Maintenance and Physical Exam  Immunization History  Administered Date(s) Administered   Influenza Whole 03/26/2008, 03/08/2011   PFIZER Comirnaty(Gray Top)Covid-19 Tri-Sucrose Vaccine 07/31/2019, 08/24/2019   PPD Test 01/30/2011, 02/07/2015   Tdap 01/30/2011, 06/14/2021    Health Maintenance  Topic Date Due   COVID-19 Vaccine (3 - 2023-24 season) 03/04/2023 (Originally 02/23/2022)   INFLUENZA VACCINE  01/24/2023   PAP SMEAR-Modifier  05/28/2025   DTaP/Tdap/Td (3 - Td or Tdap) 06/15/2031   Hepatitis C Screening  Completed   HIV Screening  Completed   HPV VACCINES  Aged Out    Discussed health benefits of physical activity, and encouraged her to engage in regular exercise appropriate for her age and condition.  Screening for diabetes mellitus -     Hemoglobin A1c  Encounter for screening mammogram for malignant neoplasm of breast -     3D Screening Mammogram, Left and Right; Future  Need for hepatitis C screening test -     Hepatitis C antibody  Screening for thyroid disorder -     TSH + free T4  Screening for lipid disorders -     CMP14+EGFR -     Lipid panel  Difficulty sleeping -     hydrOXYzine HCl; Take 1 tablet (10 mg total) by mouth at bedtime as needed.  Dispense: 30 tablet; Refill: 0  Encounter for routine adult physical exam with abnormal findings Assessment & Plan: Physical exam done, labs ordered  Updated screening and health maintenance  Exercise and nutrition counseling BMI  32.44 Discussed lifestyle modifications follow diet low in saturated fat, reduce dietary salt intake, avoid fatty foods, maintain an exercise routine 3 to 5 days a week for a minimum total of 150 minutes.   Patient interested in Phentermine, advise patient  awaiting results if labs normal we will start weight loss medication. Follow up in 4 weeks for weight loss and medication management  Month 1 Initial dose Phentermine 15 mg once daily Month 2 Phentermine 37.5 mg once daily    Adjustment insomnia Assessment & Plan: Trial on Hydroxyzine 10 mg PRN at bedtime. Explained to go to bed at the same time each night and get up at the same time each morning, including on the weekends. Make sure your bedroom is quiet, dark, relaxing, and at a comfortable temperature. Remove electronic devices, such as TVs, computers, and smart phones, from the bedroom.    Other orders -     Pantoprazole Sodium; Take 1 tablet (40 mg total) by mouth daily as needed.  Dispense: 30 tablet; Refill: 3    Return in about 1 month (around 02/14/2023), or if symptoms worsen or fail to improve, for Weight Loss Mangment.     Cruzita Lederer Newman Nip, FNP

## 2023-01-14 NOTE — Assessment & Plan Note (Signed)
Physical exam done, labs ordered  Updated screening and health maintenance  Exercise and nutrition counseling BMI  32.44 Discussed lifestyle modifications follow diet low in saturated fat, reduce dietary salt intake, avoid fatty foods, maintain an exercise routine 3 to 5 days a week for a minimum total of 150 minutes.   Patient interested in Phentermine, advise patient awaiting results if normal we will start weight loss medication

## 2023-01-15 ENCOUNTER — Other Ambulatory Visit: Payer: Self-pay | Admitting: Family Medicine

## 2023-01-15 LAB — CMP14+EGFR
ALT: 21 IU/L (ref 0–32)
AST: 19 IU/L (ref 0–40)
Albumin: 4.5 g/dL (ref 3.9–4.9)
Alkaline Phosphatase: 87 IU/L (ref 44–121)
BUN/Creatinine Ratio: 13 (ref 9–23)
BUN: 12 mg/dL (ref 6–24)
Bilirubin Total: 0.2 mg/dL (ref 0.0–1.2)
CO2: 22 mmol/L (ref 20–29)
Calcium: 9.6 mg/dL (ref 8.7–10.2)
Chloride: 103 mmol/L (ref 96–106)
Creatinine, Ser: 0.95 mg/dL (ref 0.57–1.00)
Globulin, Total: 2.9 g/dL (ref 1.5–4.5)
Glucose: 79 mg/dL (ref 70–99)
Potassium: 4.5 mmol/L (ref 3.5–5.2)
Sodium: 141 mmol/L (ref 134–144)
Total Protein: 7.4 g/dL (ref 6.0–8.5)
eGFR: 78 mL/min/{1.73_m2} (ref >59)

## 2023-01-15 LAB — LIPID PANEL
Chol/HDL Ratio: 4.2 ratio (ref 0.0–4.4)
Cholesterol, Total: 213 mg/dL — ABNORMAL HIGH (ref 100–199)
HDL: 51 mg/dL (ref >39)
LDL Chol Calc (NIH): 153 mg/dL — ABNORMAL HIGH (ref 0–99)
Triglycerides: 54 mg/dL (ref 0–149)
VLDL Cholesterol Cal: 9 mg/dL (ref 5–40)

## 2023-01-15 LAB — HEPATITIS C ANTIBODY: Hep C Virus Ab: NONREACTIVE

## 2023-01-15 LAB — HEMOGLOBIN A1C
Est. average glucose Bld gHb Est-mCnc: 108 mg/dL
Hgb A1c MFr Bld: 5.4 % (ref 4.8–5.6)

## 2023-01-15 LAB — TSH+FREE T4
Free T4: 1.14 ng/dL (ref 0.82–1.77)
TSH: 1.86 u[IU]/mL (ref 0.450–4.500)

## 2023-01-15 MED ORDER — PHENTERMINE HCL 15 MG PO CAPS: 15.0000 mg | ORAL_CAPSULE | ORAL | 0 refills | Status: DC

## 2023-01-21 ENCOUNTER — Ambulatory Visit (HOSPITAL_COMMUNITY)
Admission: RE | Admit: 2023-01-21 | Discharge: 2023-01-21 | Disposition: A | Discharge status: Home / Self Care | Payer: BC Managed Care – PPO | Source: Ambulatory Visit | Attending: Family Medicine | Admitting: Family Medicine

## 2023-01-21 ENCOUNTER — Encounter (HOSPITAL_COMMUNITY): Payer: Self-pay

## 2023-01-21 DIAGNOSIS — Z1231 Encounter for screening mammogram for malignant neoplasm of breast: Secondary | ICD-10-CM | POA: Insufficient documentation

## 2023-01-22 ENCOUNTER — Encounter: Payer: Self-pay | Admitting: Family Medicine

## 2023-01-24 NOTE — Telephone Encounter (Signed)
Switched to [place].

## 2023-02-05 ENCOUNTER — Encounter: Payer: Self-pay | Admitting: Family Medicine

## 2023-02-06 ENCOUNTER — Ambulatory Visit
Admission: EM | Admit: 2023-02-06 | Discharge: 2023-02-06 | Disposition: A | Discharge status: Home / Self Care | Payer: BC Managed Care – PPO | Attending: Family Medicine | Admitting: Family Medicine

## 2023-02-06 DIAGNOSIS — N390 Urinary tract infection, site not specified: Secondary | ICD-10-CM | POA: Diagnosis not present

## 2023-02-06 LAB — POCT URINALYSIS DIP (MANUAL ENTRY)
Bilirubin, UA: NEGATIVE
Glucose, UA: NEGATIVE mg/dL
Ketones, POC UA: NEGATIVE mg/dL
Nitrite, UA: NEGATIVE
Protein Ur, POC: NEGATIVE mg/dL
Spec Grav, UA: 1.02 (ref 1.010–1.025)
Urobilinogen, UA: 0.2 E.U./dL
pH, UA: 7 (ref 5.0–8.0)

## 2023-02-06 MED ORDER — NITROFURANTOIN MONOHYD MACRO 100 MG PO CAPS: 100.0000 mg | ORAL_CAPSULE | Freq: Two times a day (BID) | ORAL | 0 refills | Status: DC

## 2023-02-06 NOTE — ED Provider Notes (Signed)
[facility]    CSN: [id number] Arrival date & time: 02/06/23  1522      History   Chief Complaint No chief complaint on file.   HPI Martha [name] is a 42 y.o. female.   Patient presenting today with several day history of urinary frequency, urgency, dysuria, low back aching, nausea.  Denies fever, chills, vomiting, bowel changes.  So far trying over-the-counter remedies with no relief.  LMP 01/22/2023.  No concern for STIs.    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   Hyperlipidemia    Iron deficiency anemia due to chronic blood loss    hematology/ oncology--- Lupe Carney [place] PA,  treated w/ iv iron   Pre-diabetes    Spinal arachnoid cyst 2020   mild dorsal spinal cord flattening @ T2-3 intradural  (per pt followed by neuro surgeon, asymptomatic)   Wears glasses     Patient Active Problem List   Diagnosis Date Noted   Abnormal uterine and vaginal bleeding, unspecified 01/14/2023   CIN I (cervical intraepithelial neoplasia I) 01/14/2023   Endometrial mass 01/14/2023   Spinal arachnoid cyst 01/14/2023   Urine pregnancy test negative 01/14/2023   Insomnia 01/14/2023   Acute viral bronchitis 04/27/2022   Head ache 07/18/2021   Encounter for routine adult physical exam with abnormal findings 06/14/2021   Need for Tdap vaccination 06/14/2021   Lightheadedness 06/14/2021   Hematemesis 07/26/2020   Iron deficiency anemia 07/25/2020   URI (upper respiratory infection) 07/01/2020   Lower respiratory tract infection 05/23/2020   IDA (iron deficiency anemia) 02/03/2020   Heartburn 02/03/2020   Complaint of melena 12/10/2019   Arachnoid cyst 09/11/2018   Carpal tunnel syndrome of right wrist 07/10/2018   Cubital tunnel syndrome 07/10/2018   Degeneration of lumbar intervertebral  disc 07/10/2018   Low back pain 07/10/2018   Scoliosis deformity of spine 07/10/2018   Mass of joint of right wrist 07/15/2017   Iron deficiency anemia of pregnancy 06/27/2015   Dyslipidemia 10/21/2012   Prediabetes 10/21/2012   Gastroesophageal reflux disease 01/30/2011    Past Surgical History:  Procedure Laterality Date   BIOPSY  03/30/2020   Procedure: BIOPSY;  Surgeon: Corbin Ade, MD;  Location: [facility];  Service: Endoscopy;;   COLONOSCOPY N/A 03/30/2020   normal colon, normal TI.    DIAGNOSTIC LAPAROSCOPY  09/09/2001   @[facility] ;  AND D&C HYSTEROSCOPY   DILATATION & CURETTAGE/HYSTEROSCOPY WITH MYOSURE N/A 01/31/2022   Procedure: DILATATION & CURETTAGE/HYSTEROSCOPY WITH MYOSURE;  Surgeon: [name], DO;  Location: [facility];  Service: Gynecology;  Laterality: N/A;   ESOPHAGOGASTRODUODENOSCOPY  01/2010   Dr. Jena Gauss: small hiatal hernia   ESOPHAGOGASTRODUODENOSCOPY N/A 03/30/2020   mild erosive reflux esophagitis, medium-sized hiatal hernia s/p biopsy, normal duodenum. Schatzki's ring. Negative H.pylori.    LASER ABLATION OF THE CERVIX  12/14/2003   @[facility]   (CO2 laser vaporization of CIN)   TUBAL LIGATION Bilateral 06/26/2015   Procedure: POST PARTUM TUBAL LIGATION;  Surgeon: Maxie Better, MD;  Location: [facility];  Service: Gynecology;  Laterality: Bilateral;   WRIST GANGLION EXCISION  07/22/2009   @[facility]     OB History     Gravida  2   Para  2   Term  2   Preterm  AB      Living  2      SAB      IAB      Ectopic      Multiple  0   Live Births  2            Home Medications    Prior to Admission medications   Medication Sig Start Date End Date Taking? Authorizing Provider  nitrofurantoin, macrocrystal-monohydrate, (MACROBID) 100 MG capsule Take 1 capsule (100 mg total) by mouth 2 (two) times daily. 02/06/23  Yes Particia Nearing, PA-C  acetaminophen (TYLENOL) 500 MG tablet Take 500-1,000 mg by mouth every 6  (six) hours as needed (pain.).    [provider]  albuterol (PROVENTIL) (2.5 MG/3ML) 0.083% nebulizer solution Take 3 mLs (2.5 mg total) by nebulization every 6 (six) hours as needed for wheezing or shortness of breath. 07/05/20   Heather Roberts, NP  albuterol (VENTOLIN HFA) 108 (90 Base) MCG/ACT inhaler Inhale 1-2 puffs into the lungs every 6 (six) hours as needed for wheezing or shortness of breath. 10/14/22   Raspet, Denny Peon K, PA-C  budesonide-formoterol (SYMBICORT) 80-4.5 MCG/ACT inhaler Inhale 2 puffs into the lungs in the morning and at bedtime. 10/14/22   Raspet, Noberto Retort, PA-C  Cholecalciferol (VITAMIN D3) 25 MCG (1000 UT) CAPS Take 1 capsule (1,000 Units total) by mouth daily. 06/15/21   Donell Beers, FNP  dextromethorphan 15 MG/5ML syrup Take 10 mLs (30 mg total) by mouth 4 (four) times daily as needed for cough. 04/27/22   Billie Lade, MD  estradiol (VIVELLE-DOT) 0.025 MG/24HR 1 patch 2 (two) times a week. 12/20/22   [provider]  hydrOXYzine (ATARAX) 10 MG tablet Take 1 tablet (10 mg total) by mouth at bedtime as needed. 01/14/23   Del Nigel Berthold, FNP  Levonorgestrel (MIRENA, 52 MG, IU) Mirena    [provider]  Multiple Vitamin (MULTIVITAMIN) tablet Take 1 tablet by mouth daily.    [provider]  pantoprazole (PROTONIX) 40 MG tablet Take 1 tablet (40 mg total) by mouth daily as needed. 01/14/23   Del Nigel Berthold, FNP  phentermine 15 MG capsule Take 1 capsule (15 mg total) by mouth every morning. 01/15/23   Del Nigel Berthold, FNP  Respiratory Therapy Supplies (NEBULIZER MASK ADULT) MISC 1 each by Does not apply route as needed. 04/27/22   Billie Lade, MD    Family History Family History  Problem Relation Age of Onset   Asthma Mother    Allergies Mother    Diabetes Mother        prediabetes   Myasthenia gravis Father    Bladder Cancer Father        diagnosed at 82   Hyperlipidemia Father    Migraines Sister     Cancer Maternal Aunt        facial   Diabetes Paternal Uncle    Cancer Maternal Grandmother        stomach   Heart disease Paternal Grandmother    Heart disease Paternal Grandfather    Colon cancer Neg Hx    Inflammatory bowel disease Neg Hx    Celiac disease Neg Hx    Breast cancer Neg Hx    Lung cancer Neg Hx     Social History Social History   Tobacco Use   Smoking status: Never   Smokeless tobacco: Never  Vaping Use   Vaping status: Never Used  Substance Use Topics  Alcohol use: Yes    Comment: socially   Drug use: Never     Allergies   Penicillins   Review of Systems Review of Systems Per HPI  Physical Exam Triage Vital Signs ED Triage Vitals  Encounter Vitals Group     BP 02/06/23 1529 116/69     Systolic BP Percentile --      Diastolic BP Percentile --      Pulse Rate 02/06/23 1529 72     Resp 02/06/23 1529 14     Temp 02/06/23 1529 99.3 F (37.4 C)     Temp Source 02/06/23 1529 Oral     SpO2 02/06/23 1529 100 %     Weight --      Height --      Head Circumference --      Peak Flow --      Pain Score 02/06/23 1531 7     Pain Loc --      Pain Education --      Exclude from Growth Chart --    No data found.  Updated Vital Signs BP 116/69 (BP Location: Right Arm)   Pulse 72   Temp 99.3 F (37.4 C) (Oral)   Resp 14   LMP 01/22/2023 (Exact Date)   SpO2 100%   Visual Acuity Right Eye Distance:   Left Eye Distance:   Bilateral Distance:    Right Eye Near:   Left Eye Near:    Bilateral Near:     Physical Exam Vitals and nursing note reviewed.  Constitutional:      Appearance: Normal appearance. She is not ill-appearing.  HENT:     Head: Atraumatic.     Mouth/Throat:     Mouth: Mucous membranes are moist.  Eyes:     Extraocular Movements: Extraocular movements intact.     Conjunctiva/sclera: Conjunctivae normal.  Cardiovascular:     Rate and Rhythm: Normal rate and regular rhythm.     Heart sounds: Normal heart sounds.   Pulmonary:     Effort: Pulmonary effort is normal.     Breath sounds: Normal breath sounds.  Abdominal:     General: Bowel sounds are normal. There is no distension.     Palpations: Abdomen is soft.     Tenderness: There is no abdominal tenderness. There is no right CVA tenderness, left CVA tenderness or guarding.  Musculoskeletal:        General: Normal range of motion.     Cervical back: Normal range of motion and neck supple.  Skin:    General: Skin is warm and dry.  Neurological:     Mental Status: She is alert and oriented to person, place, and time.     Motor: No weakness.     Gait: Gait normal.  Psychiatric:        Mood and Affect: Mood normal.        Thought Content: Thought content normal.        Judgment: Judgment normal.      UC Treatments / Results  Labs (all labs ordered are listed, but only abnormal results are displayed) Labs Reviewed  POCT URINALYSIS DIP (MANUAL ENTRY) - Abnormal; Notable for the following components:      Result Value   Blood, UA small (*)    Leukocytes, UA Small (1+) (*)    All other components within normal limits  URINE CULTURE    EKG   Radiology No results found.  Procedures Procedures (including critical care time)  Medications Ordered in UC Medications - No data to display  Initial Impression / Assessment and Plan / UC Course  I have reviewed the triage vital signs and the nursing notes.  Pertinent labs & imaging results that were available during my care of the patient were reviewed by me and considered in my medical decision making (see chart for details).     Urinalysis today with evidence of a possible urinary tract infection.  Urine culture pending, treat with Macrobid while awaiting results and adjust if needed.  Push fluids, return for worsening symptoms.  Final Clinical Impressions(s) / UC Diagnoses   Final diagnoses:  Acute lower UTI   Discharge Instructions   None    ED Prescriptions     Medication  Sig Dispense Auth. Provider   nitrofurantoin, macrocrystal-monohydrate, (MACROBID) 100 MG capsule Take 1 capsule (100 mg total) by mouth 2 (two) times daily. 10 capsule Particia Nearing, [place]      PDMP not reviewed this encounter.   Particia Nearing, [place] 02/06/23 1649

## 2023-02-06 NOTE — ED Triage Notes (Signed)
Pt c/o possible UTI, urinary frequency, urinary urgency, burning with urination, back pain, nausea

## 2023-02-07 ENCOUNTER — Ambulatory Visit: Payer: BC Managed Care – PPO | Admitting: Internal Medicine

## 2023-02-09 LAB — URINE CULTURE: Culture: >=100000 — AB

## 2023-02-10 ENCOUNTER — Telehealth: Payer: Self-pay | Admitting: Emergency Medicine

## 2023-02-10 ENCOUNTER — Encounter: Payer: Self-pay | Admitting: Family Medicine

## 2023-02-10 ENCOUNTER — Telehealth: Payer: Self-pay

## 2023-02-10 MED ORDER — SULFAMETHOXAZOLE-TRIMETHOPRIM 800-160 MG PO TABS: 1.0000 | ORAL_TABLET | Freq: Two times a day (BID) | ORAL | 0 refills | Status: DC

## 2023-02-10 NOTE — Telephone Encounter (Signed)
Patient's culture came back resistant to Nitrofurantoin.  Bactrim DS called into patient's pharmacy. ([facility] on [address]

## 2023-02-11 ENCOUNTER — Telehealth: Payer: Self-pay

## 2023-02-11 ENCOUNTER — Other Ambulatory Visit: Payer: Self-pay | Admitting: Family Medicine

## 2023-02-11 MED ORDER — SULFAMETHOXAZOLE-TRIMETHOPRIM 800-160 MG PO TABS: 1.0000 | ORAL_TABLET | Freq: Two times a day (BID) | ORAL | 0 refills | Status: AC

## 2023-02-11 NOTE — Telephone Encounter (Signed)
Pt called stating she was unable to pick up medication that was sent yesterday. Pt also states someone called her that afternoon after susan and stated not to take the medication that was prescribed, found no documentation on the reason to not take the antibiotic that susan sent, spoke with out provider here and she stated the the bactrim was appropriate and to re-send the order for pt.

## 2023-02-15 ENCOUNTER — Ambulatory Visit: Payer: BC Managed Care – PPO | Admitting: Internal Medicine

## 2023-02-15 ENCOUNTER — Ambulatory Visit (INDEPENDENT_AMBULATORY_CARE_PROVIDER_SITE_OTHER): Payer: BC Managed Care – PPO | Admitting: Family Medicine

## 2023-02-15 ENCOUNTER — Encounter: Payer: Self-pay | Admitting: Family Medicine

## 2023-02-15 VITALS — BP 108/76 | HR 83 | Ht 66.0 in | Wt 200.0 lb

## 2023-02-15 DIAGNOSIS — N920 Excessive and frequent menstruation with regular cycle: Secondary | ICD-10-CM | POA: Diagnosis not present

## 2023-02-15 DIAGNOSIS — R300 Dysuria: Secondary | ICD-10-CM

## 2023-02-15 DIAGNOSIS — R399 Unspecified symptoms and signs involving the genitourinary system: Secondary | ICD-10-CM | POA: Diagnosis not present

## 2023-02-15 DIAGNOSIS — E669 Obesity, unspecified: Secondary | ICD-10-CM

## 2023-02-15 MED ORDER — PHENTERMINE HCL 37.5 MG PO CAPS: 37.5000 mg | ORAL_CAPSULE | ORAL | 3 refills | Status: DC

## 2023-02-15 NOTE — Patient Instructions (Signed)

## 2023-02-15 NOTE — Progress Notes (Signed)
Patient Office Visit   Subjective   Patient ID: Martha [name], female    DOB: 1980-08-10  Age: 42 y.o. MRN: [id number]  CC:  Chief Complaint  Patient presents with   Weight Loss    F/u weight loss management.    Urinary Tract Infection    Seen at [facility] for UTI last Monday 02/04/23 [place] pneumonia she is having trouble getting prescription.     HPI Martha [name] 42 year old female, presents to the clinic for urinary symptoms  She  has a past medical history of Abnormal uterine bleeding (AUB), Asthma, GERD (gastroesophageal reflux disease), Hiatal hernia, History of cervical dysplasia (2005), Hyperlipidemia, Iron deficiency anemia due to chronic blood loss, Pre-diabetes, Spinal arachnoid cyst (2020), and Wears glasses.For the details of today's visit, please refer to assessment and plan.   HPI    Outpatient Encounter Medications as of 02/15/2023  Medication Sig   phentermine 37.5 MG capsule Take 1 capsule (37.5 mg total) by mouth every morning.   acetaminophen (TYLENOL) 500 MG tablet Take 500-1,000 mg by mouth every 6 (six) hours as needed (pain.).   albuterol (PROVENTIL) (2.5 MG/3ML) 0.083% nebulizer solution Take 3 mLs (2.5 mg total) by nebulization every 6 (six) hours as needed for wheezing or shortness of breath.   albuterol (VENTOLIN HFA) 108 (90 Base) MCG/ACT inhaler Inhale 1-2 puffs into the lungs every 6 (six) hours as needed for wheezing or shortness of breath.   budesonide-formoterol (SYMBICORT) 80-4.5 MCG/ACT inhaler Inhale 2 puffs into the lungs in the morning and at bedtime.   Cholecalciferol (VITAMIN D3) 25 MCG (1000 UT) CAPS Take 1 capsule (1,000 Units total) by mouth daily.   dextromethorphan 15 MG/5ML syrup Take 10 mLs (30 mg total) by mouth 4 (four) times daily as needed for cough.   estradiol (VIVELLE-DOT) 0.025 MG/24HR 1 patch 2 (two) times a week.   hydrOXYzine (ATARAX) 10 MG tablet Take 1 tablet (10 mg total) by mouth at bedtime as needed.   Levonorgestrel  (MIRENA, 52 MG, IU) Mirena   Multiple Vitamin (MULTIVITAMIN) tablet Take 1 tablet by mouth daily.   nitrofurantoin, macrocrystal-monohydrate, (MACROBID) 100 MG capsule Take 1 capsule (100 mg total) by mouth 2 (two) times daily.   pantoprazole (PROTONIX) 40 MG tablet Take 1 tablet (40 mg total) by mouth daily as needed.   Respiratory Therapy Supplies (NEBULIZER MASK ADULT) MISC 1 each by Does not apply route as needed.   sulfamethoxazole-trimethoprim (BACTRIM DS) 800-160 MG tablet Take 1 tablet by mouth 2 (two) times daily for 7 days.   [DISCONTINUED] phentermine 15 MG capsule Take 1 capsule (15 mg total) by mouth every morning.   No facility-administered encounter medications on file as of 02/15/2023.    Past Surgical History:  Procedure Laterality Date   BIOPSY  03/30/2020   Procedure: BIOPSY;  Surgeon: Corbin Ade, MD;  [facility];  Service: Endoscopy;;   COLONOSCOPY N/A 03/30/2020   normal colon, normal TI.    DIAGNOSTIC LAPAROSCOPY  09/09/2001   @[facility] ;  AND D&C HYSTEROSCOPY   DILATATION & CURETTAGE/HYSTEROSCOPY WITH MYOSURE N/A 01/31/2022   Procedure: DILATATION & CURETTAGE/HYSTEROSCOPY WITH MYOSURE;  Surgeon: [name], DO;  Location: [facility];  Service: Gynecology;  Laterality: N/A;   ESOPHAGOGASTRODUODENOSCOPY  01/2010   Dr. Jena Gauss: small hiatal hernia   ESOPHAGOGASTRODUODENOSCOPY N/A 03/30/2020   mild erosive reflux esophagitis, medium-sized hiatal hernia s/p biopsy, normal duodenum. Schatzki's ring. Negative H.pylori.    LASER ABLATION OF THE  CERVIX  12/14/2003   @[facility]   (CO2 laser vaporization of CIN)   TUBAL LIGATION Bilateral 06/26/2015   Procedure: POST PARTUM TUBAL LIGATION;  Surgeon: Maxie Better, MD;  Location: [facility];  Service: Gynecology;  Laterality: Bilateral;   WRIST GANGLION EXCISION  07/22/2009   @[facility]     Review of Systems  Constitutional:  Negative for chills and fever.  Eyes:  Negative for blurred vision.   Respiratory:  Negative for shortness of breath.   Cardiovascular:  Negative for chest pain.  Genitourinary:  Positive for dysuria, flank pain, frequency and urgency. Negative for hematuria.  Neurological:  Negative for dizziness.      Objective    BP 108/76 (BP Location: Right Arm, Patient Position: Sitting, Cuff Size: Large)   Pulse 83   Ht 5\' 6"  (1.676 m)   Wt 200 lb (90.7 kg)   LMP 01/22/2023 (Exact Date)   SpO2 100%   BMI 32.28 kg/m   Physical Exam Vitals reviewed.  Constitutional:      General: She is not in acute distress.    Appearance: Normal appearance. She is not ill-appearing, toxic-appearing or diaphoretic.  HENT:     Head: Normocephalic.  Eyes:     General:        Right eye: No discharge.        Left eye: No discharge.     Conjunctiva/sclera: Conjunctivae normal.  Cardiovascular:     Rate and Rhythm: Normal rate.     Pulses: Normal pulses.     Heart sounds: Normal heart sounds.  Pulmonary:     Effort: Pulmonary effort is normal. No respiratory distress.     Breath sounds: Normal breath sounds.  Musculoskeletal:        General: Normal range of motion.     Cervical back: Normal range of motion.  Skin:    General: Skin is warm and dry.     Capillary Refill: Capillary refill takes less than 2 seconds.  Neurological:     General: No focal deficit present.     Mental Status: She is alert and oriented to person, place, and time.     Coordination: Coordination normal.     Gait: Gait normal.  Psychiatric:        Mood and Affect: Mood normal.        Behavior: Behavior normal.       Assessment & Plan:  Urinary symptom or sign -     Urine Culture -     Urinalysis  Spotting -     CBC with Differential/Platelet  Dysuria Assessment & Plan: Urinalysis, urine culture ordered- Awaiting results will follow up. May take OTC AZO for urinary pain relief. Explained to increase oral fluid intake. Drink 8 glasses of water daily.  Follow up for worsening or  persistent symptoms. Patient verbalizes understanding regarding plan of care and all questions answered.    Obesity (BMI 30-39.9) Assessment & Plan: Vitals:   02/15/23 1557  BP: 108/76    Patient tolerated Phentermine 15 mg daily well no side effects Increased Phentermine 37.5 mg once daily Continued discussion adhering to weight loss plan, with strong emphasize on nutrition and exercise. Read food labels to know how many calories are in each serving , Increase water intake 2-3 L a day. Include more protein intake such as lean meat, poultry, fish. Increase fiber intake such as vegetables, whole grains, fruits, artichokes, green peas, broccoli, lentils and lima beans.    Other orders -  Phentermine HCl; Take 1 capsule (37.5 mg total) by mouth every morning.  Dispense: 30 capsule; Refill: 3    Return in about 3 months (around 05/18/2023), or if symptoms worsen or fail to improve, for Weight Loss Mangment.   Cruzita Lederer Newman Nip, FNP

## 2023-02-15 NOTE — Assessment & Plan Note (Signed)
Urinalysis, urine culture ordered- Awaiting results will follow up. May take OTC AZO for urinary pain relief. Explained to increase oral fluid intake. Drink 8 glasses of water daily.  Follow up for worsening or persistent symptoms. Patient verbalizes understanding regarding plan of care and all questions answered.

## 2023-02-15 NOTE — Assessment & Plan Note (Signed)
Vitals:   02/15/23 1557  BP: 108/76    Patient tolerated Phentermine 15 mg daily well no side effects Increased Phentermine 37.5 mg once daily Continued discussion adhering to weight loss plan, with strong emphasize on nutrition and exercise. Read food labels to know how many calories are in each serving , Increase water intake 2-3 L a day. Include more protein intake such as lean meat, poultry, fish. Increase fiber intake such as vegetables, whole grains, fruits, artichokes, green peas, broccoli, lentils and lima beans.

## 2023-02-16 LAB — CBC WITH DIFFERENTIAL/PLATELET
Basophils Absolute: 0 10*3/uL (ref 0.0–0.2)
Basos: 0 %
EOS (ABSOLUTE): 0.1 10*3/uL (ref 0.0–0.4)
Eos: 1 %
Hematocrit: 37.4 % (ref 34.0–46.6)
Hemoglobin: 11.9 g/dL (ref 11.1–15.9)
Immature Grans (Abs): 0 10*3/uL (ref 0.0–0.1)
Immature Granulocytes: 0 %
Lymphocytes Absolute: 2.5 10*3/uL (ref 0.7–3.1)
Lymphs: 26 %
MCH: 26.3 pg — ABNORMAL LOW (ref 26.6–33.0)
MCHC: 31.8 g/dL (ref 31.5–35.7)
MCV: 83 fL (ref 79–97)
Monocytes Absolute: 0.7 10*3/uL (ref 0.1–0.9)
Monocytes: 8 %
Neutrophils Absolute: 6.3 10*3/uL (ref 1.4–7.0)
Neutrophils: 65 %
Platelets: 239 10*3/uL (ref 150–450)
RBC: 4.52 x10E6/uL (ref 3.77–5.28)
RDW: 12.8 % (ref 11.7–15.4)
WBC: 9.7 10*3/uL (ref 3.4–10.8)

## 2023-02-17 LAB — URINE CULTURE

## 2023-05-16 NOTE — Progress Notes (Unsigned)
   Established Patient Office Visit   Subjective  Patient ID: Martha [name], female    DOB: February 12, 1981  Age: 42 y.o. MRN: [id number]  No chief complaint on file.   She  has a past medical history of Abnormal uterine bleeding (AUB), Asthma, GERD (gastroesophageal reflux disease), Hiatal hernia, History of cervical dysplasia (2005), Hyperlipidemia, Iron deficiency anemia due to chronic blood loss, Pre-diabetes, Spinal arachnoid cyst (2020), and Wears glasses.  HPI  ROS    Objective:     There were no vitals taken for this visit. {Vitals History (Optional):23777}  Physical Exam   No results found for any visits on 05/17/23.  The 10-year ASCVD risk score (Arnett DK, et al., 2019) is: 0.3%    Assessment & Plan:  There are no diagnoses linked to this encounter.  No follow-ups on file.   Cruzita Lederer Newman Nip, FNP

## 2023-05-16 NOTE — Patient Instructions (Signed)

## 2023-05-17 ENCOUNTER — Ambulatory Visit: Payer: BC Managed Care – PPO | Admitting: Family Medicine

## 2023-05-29 ENCOUNTER — Inpatient Hospital Stay: Payer: BC Managed Care – PPO | Attending: Physician Assistant

## 2023-05-29 DIAGNOSIS — D509 Iron deficiency anemia, unspecified: Secondary | ICD-10-CM | POA: Diagnosis present

## 2023-05-29 DIAGNOSIS — K449 Diaphragmatic hernia without obstruction or gangrene: Secondary | ICD-10-CM | POA: Insufficient documentation

## 2023-05-29 DIAGNOSIS — R519 Headache, unspecified: Secondary | ICD-10-CM | POA: Diagnosis not present

## 2023-05-29 DIAGNOSIS — K219 Gastro-esophageal reflux disease without esophagitis: Secondary | ICD-10-CM | POA: Diagnosis not present

## 2023-05-29 DIAGNOSIS — Z79899 Other long term (current) drug therapy: Secondary | ICD-10-CM | POA: Insufficient documentation

## 2023-05-29 DIAGNOSIS — N920 Excessive and frequent menstruation with regular cycle: Secondary | ICD-10-CM | POA: Diagnosis not present

## 2023-05-29 DIAGNOSIS — R5383 Other fatigue: Secondary | ICD-10-CM | POA: Diagnosis not present

## 2023-05-29 DIAGNOSIS — D500 Iron deficiency anemia secondary to blood loss (chronic): Secondary | ICD-10-CM

## 2023-05-29 DIAGNOSIS — R42 Dizziness and giddiness: Secondary | ICD-10-CM | POA: Insufficient documentation

## 2023-05-29 LAB — IRON AND TIBC
Iron: 34 ug/dL (ref 28–170)
Saturation Ratios: 10 % — ABNORMAL LOW (ref 10.4–31.8)
TIBC: 347 ug/dL (ref 250–450)
UIBC: 313 ug/dL

## 2023-05-29 LAB — CBC WITH DIFFERENTIAL/PLATELET
Abs Immature Granulocytes: 0.03 10*3/uL (ref 0.00–0.07)
Basophils Absolute: 0 10*3/uL (ref 0.0–0.1)
Basophils Relative: 0 %
Eosinophils Absolute: 0 10*3/uL (ref 0.0–0.5)
Eosinophils Relative: 1 %
HCT: 37.9 % (ref 36.0–46.0)
Hemoglobin: 11.7 g/dL — ABNORMAL LOW (ref 12.0–15.0)
Immature Granulocytes: 0 %
Lymphocytes Relative: 26 %
Lymphs Abs: 2.2 10*3/uL (ref 0.7–4.0)
MCH: 26.8 pg (ref 26.0–34.0)
MCHC: 30.9 g/dL (ref 30.0–36.0)
MCV: 86.7 fL (ref 80.0–100.0)
Monocytes Absolute: 0.6 10*3/uL (ref 0.1–1.0)
Monocytes Relative: 7 %
Neutro Abs: 5.5 10*3/uL (ref 1.7–7.7)
Neutrophils Relative %: 66 %
Platelets: 240 10*3/uL (ref 150–400)
RBC: 4.37 MIL/uL (ref 3.87–5.11)
RDW: 13.2 % (ref 11.5–15.5)
WBC: 8.3 10*3/uL (ref 4.0–10.5)
nRBC: 0 % (ref 0.0–0.2)

## 2023-05-29 LAB — FERRITIN: Ferritin: 110 ng/mL (ref 11–307)

## 2023-05-30 ENCOUNTER — Encounter: Payer: Self-pay | Admitting: Family Medicine

## 2023-06-04 NOTE — Progress Notes (Unsigned)
VIRTUAL VISIT via TELEPHONE NOTE [facility]   I connected with Martha [name]  on 06/05/23 at  1:00 PM by telephone and verified that I am speaking with the correct person using two identifiers.  Location: Patient: Home Provider: [facility]   I discussed the limitations, risks, security and privacy concerns of performing an evaluation and management service by telephone and the availability of in person appointments. I also discussed with the patient that there may be a patient responsible charge related to this service. The patient expressed understanding and agreed to proceed.  REASON FOR VISIT:  Follow-up for iron deficiency anemia   PRIOR THERAPY: Oral iron tablets   CURRENT THERAPY: Intermittent Venofer  INTERVAL HISTORY:  Martha [name] is contacted today for follow-up of  iron deficiency anemia.  She was last seen by Rojelio Brenner PA-C on 12/04/2022.  She reports that she had been feeling fairly well with good energy up until about a month ago, when she noticed that her energy level starting to drop, also associated with lightheadedness, increased headaches, and leg cramps. She denies any chest pain, dyspnea on exertion, pica, or syncopal episodes.     She had IUD placed in early 2024 to see if this would improve her menorrhagia.  Due to lack of improvement, IUD was removed in October 2024.  Since then, she has had intermittently heavy but irregular periods.  Gynecologist recommended partial hysterectomy, but patient has not yet made up her mind about this.  She has not had any recurrent hematemesis since isolated episode in April 2023.  No melena, hematochezia, or epistaxis.  She has been taking liquid iron for the past 6 months, tolerating this well.   She has 50% energy and 100% appetite. She endorses that she is maintaining a stable weight.   REVIEW OF SYSTEMS:   Review of Systems  Constitutional:  Positive for malaise/fatigue. Negative  for chills, diaphoresis, fever and weight loss.  Respiratory:  Negative for cough and shortness of breath.   Cardiovascular:  Negative for chest pain and palpitations.  Gastrointestinal:  Negative for abdominal pain, blood in stool, melena, nausea and vomiting.  Neurological:  Positive for dizziness and headaches.  Psychiatric/Behavioral:  The patient has insomnia.      PHYSICAL EXAM: (per limitations of virtual telephone visit)  The patient is alert and oriented x 3, exhibiting adequate mentation, good mood, and ability to speak in full sentences and execute sound judgement.  ASSESSMENT & PLAN:  1.  Iron deficiency anemia: - Most likely etiology is normal blood loss from menses as well as malabsorption in the setting of chronic PPI use - Initial labs on 06/10/2020 with hemoglobin 11 and ferritin of 25. - Additional work-up from 07/21/2020 showed normal B12, methylmalonic acid, copper and folic acid levels.  SPEP was also negative. - She took oral iron for over 1 year without improvement in her levels - No history of transfusion. - EGD on 03/30/2020 with mild erosive reflux esophagitis, medium sized hiatal hernia.  Normal duodenum. - Colonoscopy on 03/30/2020 was normal.  Normal distal ileum. - Most recent IV Venofer (300 mg x 3) from 06/22/2022 through 07/06/2022 - She reported improvement in her energy levels after IV iron infusions - Currently reports increased fatigue, headaches, dizziness - She has heavy menses, following with GYN.  Single episode of hematemesis in April 2023, no recurrence.  No bleeding per rectum reported.     - Most recent labs (05/29/2023):  Hgb 11.7/MCV 86.7, ferritin 110, iron saturation 10%. - PLAN: Recommend IV Venofer 400 mg x 1. - Continue daily liquid iron. - Labs and OFFICE visit in 6 months    2.  GERD: - This is likely from hiatal hernia.  She is taking Protonix twice weekly.   3.  Healthy lifestyle discussion - We discussed importance of cutting back on  sugary treats and drinking more water   4.  Social/family history: - She works as a Pension scheme manager. - Maternal first cousin had to have blood transfusions.  Maternal aunt had nasopharyngeal cancer.   PLAN SUMMARY: >> Venofer 400 mg x 1 >> Labs in 6 months = CBC/D, ferritin, iron/TIBC >> OFFICE visit in 6 month (1 week after labs)   ** Last office visit on 12/04/2022     I discussed the assessment and treatment plan with the patient. The patient was provided an opportunity to ask questions and all were answered. The patient agreed with the plan and demonstrated an understanding of the instructions.   The patient was advised to call back or seek an in-person evaluation if the symptoms worsen or if the condition fails to improve as anticipated.  I provided 18 minutes of non-face-to-face time during this encounter.  Carnella Guadalajara, PA-C 06/05/23 1:20 PM

## 2023-06-05 ENCOUNTER — Inpatient Hospital Stay (HOSPITAL_BASED_OUTPATIENT_CLINIC_OR_DEPARTMENT_OTHER): Payer: BC Managed Care – PPO | Admitting: Physician Assistant

## 2023-06-05 DIAGNOSIS — D500 Iron deficiency anemia secondary to blood loss (chronic): Secondary | ICD-10-CM

## 2023-06-17 ENCOUNTER — Inpatient Hospital Stay: Payer: BC Managed Care – PPO

## 2023-06-17 VITALS — BP 131/87 | HR 95 | Temp 97.6°F | Resp 18

## 2023-06-17 DIAGNOSIS — D509 Iron deficiency anemia, unspecified: Secondary | ICD-10-CM | POA: Diagnosis not present

## 2023-06-17 MED ORDER — SODIUM CHLORIDE 0.9 % IV SOLN: INTRAVENOUS | Status: DC

## 2023-06-17 MED ORDER — SODIUM CHLORIDE 0.9 % IV SOLN
400.0000 mg | Freq: Once | INTRAVENOUS | Status: AC
  Administered 2023-06-17: 400 mg via INTRAVENOUS
  Filled 2023-06-17: qty 20

## 2023-06-17 NOTE — Progress Notes (Signed)
Patient presents today for iron infusion. Patient is in satisfactory condition with no new complaints voiced.  Vital signs are stable.  We will proceed with infusion per provider orders.    Patient took Tylenol and Zyrtec at 0740 at home prior to arrival. Peripheral IV started with good blood return pre and post infusion.  Venofer 400 mg given today per MD orders. Tolerated infusion without adverse affects. Vital signs stable. No complaints at this time. Discharged from clinic ambulatory in stable condition. Alert and oriented x 3. F/U with [facility] as scheduled.

## 2023-06-17 NOTE — Patient Instructions (Signed)
 [facility] [place] - A DEPT OF [facility]  Discharge Instructions: Thank you for choosing [facility] to provide your oncology and hematology care.  If you have a lab appointment with the Cancer Center - please note that after April 8th, 2024, all labs will be drawn in the cancer center.  You do not have to check in or register with the main entrance as you have in the past but will complete your check-in in the cancer center.  Wear comfortable clothing and clothing appropriate for easy access to any Portacath or PICC line.   We strive to give you quality time with your provider. You may need to reschedule your appointment if you arrive late (15 or more minutes).  Arriving late affects you and other patients whose appointments are after yours.  Also, if you miss three or more appointments without notifying the office, you may be dismissed from the clinic at the provider's discretion.      For prescription refill requests, have your pharmacy contact our office and allow 72 hours for refills to be completed.    Today you received Venofer IV iron infusion.     BELOW ARE SYMPTOMS THAT SHOULD BE REPORTED IMMEDIATELY: *FEVER GREATER THAN 100.4 F (38 C) OR HIGHER *CHILLS OR SWEATING *NAUSEA AND VOMITING THAT IS NOT CONTROLLED WITH YOUR NAUSEA MEDICATION *UNUSUAL SHORTNESS OF BREATH *UNUSUAL BRUISING OR BLEEDING *URINARY PROBLEMS (pain or burning when urinating, or frequent urination) *BOWEL PROBLEMS (unusual diarrhea, constipation, pain near the anus) TENDERNESS IN MOUTH AND THROAT WITH OR WITHOUT PRESENCE OF ULCERS (sore throat, sores in mouth, or a toothache) UNUSUAL RASH, SWELLING OR PAIN  UNUSUAL VAGINAL DISCHARGE OR ITCHING   Items with * indicate a potential emergency and should be followed up as soon as possible or go to the Emergency Department if any problems should occur.  Please show the CHEMOTHERAPY ALERT CARD or IMMUNOTHERAPY ALERT CARD at  check-in to the Emergency Department and triage nurse.  Should you have questions after your visit or need to cancel or reschedule your appointment, please contact [facility] [place] - A DEPT OF [facility] [phone]  and follow the prompts.  Office hours are 8:00 a.m. to 4:30 p.m. Monday - Friday. Please note that voicemails left after 4:00 p.m. may not be returned until the following business day.  We are closed weekends and major holidays. You have access to a nurse at all times for urgent questions. Please call the main number to the clinic [phone] and follow the prompts.  For any non-urgent questions, you may also contact your provider using MyChart. We now offer e-Visits for anyone 33 and older to request care online for non-urgent symptoms. For details visit [email or web address].   Also download the MyChart app! Go to the app store, search "MyChart", open the app, select [place], and log in with your MyChart username and password.

## 2023-06-24 ENCOUNTER — Encounter: Payer: Self-pay | Admitting: Hematology

## 2023-07-24 ENCOUNTER — Inpatient Hospital Stay: Attending: Physician Assistant

## 2023-07-24 ENCOUNTER — Inpatient Hospital Stay

## 2023-07-24 DIAGNOSIS — K449 Diaphragmatic hernia without obstruction or gangrene: Secondary | ICD-10-CM | POA: Insufficient documentation

## 2023-07-24 DIAGNOSIS — R5383 Other fatigue: Secondary | ICD-10-CM | POA: Insufficient documentation

## 2023-07-24 DIAGNOSIS — N920 Excessive and frequent menstruation with regular cycle: Secondary | ICD-10-CM | POA: Diagnosis not present

## 2023-07-24 DIAGNOSIS — Z79899 Other long term (current) drug therapy: Secondary | ICD-10-CM | POA: Insufficient documentation

## 2023-07-24 DIAGNOSIS — K219 Gastro-esophageal reflux disease without esophagitis: Secondary | ICD-10-CM | POA: Insufficient documentation

## 2023-07-24 DIAGNOSIS — R519 Headache, unspecified: Secondary | ICD-10-CM | POA: Diagnosis not present

## 2023-07-24 DIAGNOSIS — R42 Dizziness and giddiness: Secondary | ICD-10-CM | POA: Diagnosis not present

## 2023-07-24 DIAGNOSIS — D509 Iron deficiency anemia, unspecified: Secondary | ICD-10-CM | POA: Diagnosis present

## 2023-07-24 DIAGNOSIS — D500 Iron deficiency anemia secondary to blood loss (chronic): Secondary | ICD-10-CM

## 2023-07-24 LAB — CBC WITH DIFFERENTIAL/PLATELET
Abs Immature Granulocytes: 0.02 10*3/uL (ref 0.00–0.07)
Basophils Absolute: 0 10*3/uL (ref 0.0–0.1)
Basophils Relative: 0 %
Eosinophils Absolute: 0.1 10*3/uL (ref 0.0–0.5)
Eosinophils Relative: 1 %
HCT: 37.7 % (ref 36.0–46.0)
Hemoglobin: 12.1 g/dL (ref 12.0–15.0)
Immature Granulocytes: 0 %
Lymphocytes Relative: 27 %
Lymphs Abs: 2.2 10*3/uL (ref 0.7–4.0)
MCH: 27.2 pg (ref 26.0–34.0)
MCHC: 32.1 g/dL (ref 30.0–36.0)
MCV: 84.7 fL (ref 80.0–100.0)
Monocytes Absolute: 0.6 10*3/uL (ref 0.1–1.0)
Monocytes Relative: 8 %
Neutro Abs: 5.2 10*3/uL (ref 1.7–7.7)
Neutrophils Relative %: 64 %
Platelets: 230 10*3/uL (ref 150–400)
RBC: 4.45 MIL/uL (ref 3.87–5.11)
RDW: 13.5 % (ref 11.5–15.5)
WBC: 8.2 10*3/uL (ref 4.0–10.5)
nRBC: 0 % (ref 0.0–0.2)

## 2023-07-24 LAB — IRON AND TIBC
Iron: 40 ug/dL (ref 28–170)
Saturation Ratios: 12 % (ref 10.4–31.8)
TIBC: 323 ug/dL (ref 250–450)
UIBC: 283 ug/dL

## 2023-07-24 LAB — FERRITIN: Ferritin: 198 ng/mL (ref 11–307)

## 2023-07-25 ENCOUNTER — Telehealth (HOSPITAL_COMMUNITY): Payer: Self-pay | Admitting: *Deleted

## 2023-07-25 NOTE — Telephone Encounter (Signed)
Provider also advised patient to call the clinic and schedule labs if ongoing symptoms continue. Patient verbalized understanding.

## 2023-07-25 NOTE — Telephone Encounter (Signed)
Labs were drawn from patient yesterday. Martha [name]-PA-C stated patient's labs did not indicate a need for an IV iron infusion. Patient called and made aware and verbalized understanding. Patient advised to call the clinic if needed.

## 2023-07-31 ENCOUNTER — Telehealth: Admitting: Physician Assistant

## 2023-07-31 DIAGNOSIS — J111 Influenza due to unidentified influenza virus with other respiratory manifestations: Secondary | ICD-10-CM

## 2023-07-31 DIAGNOSIS — R6889 Other general symptoms and signs: Secondary | ICD-10-CM

## 2023-07-31 DIAGNOSIS — R079 Chest pain, unspecified: Secondary | ICD-10-CM

## 2023-07-31 DIAGNOSIS — T887XXA Unspecified adverse effect of drug or medicament, initial encounter: Secondary | ICD-10-CM

## 2023-07-31 DIAGNOSIS — R0602 Shortness of breath: Secondary | ICD-10-CM

## 2023-07-31 MED ORDER — BENZONATATE 100 MG PO CAPS: 100.0000 mg | ORAL_CAPSULE | Freq: Three times a day (TID) | ORAL | 0 refills | Status: DC | PRN

## 2023-07-31 NOTE — Progress Notes (Signed)
   Thank you for the details you included in the comment boxes. Those details are very helpful in determining the best course of treatment for you and help us  to provide the best care. Because you mention shortness of breath and chest pain, we recommend that you schedule a Virtual Urgent Care video visit in order for the provider to better assess what is going on.  The provider will be able to give you a more accurate diagnosis and treatment plan if we can more freely discuss your symptoms and with the addition of a virtual examination.   If you change your visit to a video visit, we will bill your insurance (similar to an office visit) and you will not be charged for this e-Visit. You will be able to stay at home and speak with the first available [facility] advanced practice provider. The link to do a video visit is in the drop down Menu tab of your Welcome screen in MyChart.     I have spent 5 minutes in review of e-visit questionnaire, review and updating patient chart, medical decision making and response to patient.   Delon CHRISTELLA Dickinson, PA-C

## 2023-07-31 NOTE — Patient Instructions (Signed)
 Martha [name], thank you for joining Martha Velma Lunger, Martha [name] for today's virtual visit.  While this provider is not your primary care provider (PCP), if your PCP is located in our provider database this encounter information will be shared with them immediately following your visit.   A [place] MyChart account gives you access to today's visit and all your visits, tests, and labs performed at [facility]  click here if you don't have a [facility] MyChart account or go to [email or web address]  Consent: (Patient) Martha [name] provided verbal consent for this virtual visit at the beginning of the encounter.  Current Medications:  Current Outpatient Medications:    acetaminophen  (TYLENOL ) 500 MG tablet, Take 500-1,000 mg by mouth every 6 (six) hours as needed (pain.)., Disp: , Rfl:    albuterol  (PROVENTIL ) (2.5 MG/3ML) 0.083% nebulizer solution, Take 3 mLs (2.5 mg total) by nebulization every 6 (six) hours as needed for wheezing or shortness of breath., Disp: 150 mL, Rfl: 1   albuterol  (VENTOLIN  HFA) 108 (90 Base) MCG/ACT inhaler, Inhale 1-2 puffs into the lungs every 6 (six) hours as needed for wheezing or shortness of breath., Disp: 18 g, Rfl: 1   budesonide -formoterol  (SYMBICORT ) 80-4.5 MCG/ACT inhaler, Inhale 2 puffs into the lungs in the morning and at bedtime., Disp: 1 each, Rfl: 1   Cholecalciferol (VITAMIN D3) 25 MCG (1000 UT) CAPS, Take 1 capsule (1,000 Units total) by mouth daily., Disp: 60 capsule, Rfl: 3   dextromethorphan  15 MG/5ML syrup, Take 10 mLs (30 mg total) by mouth 4 (four) times daily as needed for cough., Disp: 120 mL, Rfl: 0   estradiol (VIVELLE-DOT) 0.025 MG/24HR, 1 patch 2 (two) times a week., Disp: , Rfl:    hydrOXYzine  (ATARAX ) 10 MG tablet, Take 1 tablet (10 mg total) by mouth at bedtime as needed., Disp: 30 tablet, Rfl: 0   Levonorgestrel  (MIRENA , 52 MG, IU), Mirena , Disp: , Rfl:    Multiple Vitamin (MULTIVITAMIN) tablet, Take 1 tablet by  mouth daily., Disp: , Rfl:    nitrofurantoin , macrocrystal-monohydrate, (MACROBID ) 100 MG capsule, Take 1 capsule (100 mg total) by mouth 2 (two) times daily., Disp: 10 capsule, Rfl: 0   pantoprazole  (PROTONIX ) 40 MG tablet, Take 1 tablet (40 mg total) by mouth daily as needed., Disp: 30 tablet, Rfl: 3   phentermine  37.5 MG capsule, Take 1 capsule (37.5 mg total) by mouth every morning., Disp: 30 capsule, Rfl: 3   Respiratory Therapy Supplies (NEBULIZER MASK ADULT) MISC, 1 each by Does not apply route as needed., Disp: 1 each, Rfl: 0   Medications ordered in this encounter:  No orders of the defined types were placed in this encounter.    *If you need refills on other medications prior to your next appointment, please contact your pharmacy*  Follow-Up: Call back or seek an in-person evaluation if the symptoms worsen or if the condition fails to improve as anticipated.  [facility] [phone]  Other Instructions Please continue to remain well-hydrated and try to get plenty of rest. It is okay to continue your over-the-counter medications. Stop the Tamiflu as discussed. Continue your maintenance asthma medications as well as the course of prednisone . I have sent in a prescription cough medicine to further help control the symptom of the cough. If symptoms are not substantially improving over the next 48 hours or you note any new or worsening symptoms, please seek an in person evaluation ASAP.  Do not delay care.   If you  have been instructed to have an in-person evaluation today at a local Urgent Care facility, please use the link below. It will take you to a list of all of our available [place] [facility], including address, phone number and hours of operation. Please do not delay care.  [facility]  If you or a family member do not have a primary care provider, use the link below to schedule a visit and establish care. When you choose a [facility]  primary care physician or advanced practice provider, you gain a long-term partner in health. Find a Primary Care Provider  Learn more about [place] [facility]'s in-office and virtual care options: [place] - Get Care Now

## 2023-07-31 NOTE — Progress Notes (Signed)
 Virtual Visit Consent   Martha [name], you are scheduled for a virtual visit with a [facility] provider today. Just as with appointments in the office, your consent must be obtained to participate. Your consent will be active for this visit and any virtual visit you may have with one of our providers in the next 365 days. If you have a MyChart account, a copy of this consent can be sent to you electronically.  As this is a virtual visit, video technology does not allow for your provider to perform a traditional examination. This may limit your provider's ability to fully assess your condition. If your provider identifies any concerns that need to be evaluated in person or the need to arrange testing (such as labs, EKG, etc.), we will make arrangements to do so. Although advances in technology are sophisticated, we cannot ensure that it will always work on either your end or our end. If the connection with a video visit is poor, the visit may have to be switched to a telephone visit. With either a video or telephone visit, we are not always able to ensure that we have a secure connection.  By engaging in this virtual visit, you consent to the provision of healthcare and authorize for your insurance to be billed (if applicable) for the services provided during this visit. Depending on your insurance coverage, you may receive a charge related to this service.  I need to obtain your verbal consent now. Are you willing to proceed with your visit today? Martha [name] has provided verbal consent on 07/31/2023 for a virtual visit (video or telephone). Martha [name], [place]  Date: 07/31/2023 4:20 PM  Virtual Visit via Video Note   I, Martha [name], connected with  Martha [name]  ([id number], 1981/05/28) on 07/31/23 at  4:15 PM EST by a video-enabled telemedicine application and verified that I am speaking with the correct person using two identifiers.  Location: Patient: Virtual Visit Location  Patient: Home Provider: Virtual Visit Location Provider: Home Office   I discussed the limitations of evaluation and management by telemedicine and the availability of in person appointments. The patient expressed understanding and agreed to proceed.    History of Present Illness: Martha [name] is a 43 y.o. who identifies as a female who was assigned female at birth, and is being seen today for some ongoing and new symptoms after her diagnosis last Thursday of influenza.  Notes her youngest child tested positive for influenza.  She had symptoms starting late last week.  Asked that she was evaluated in urgent care and had negative flu and COVID testing but given her classic symptoms and that exposure, will started on Tamiflu and a course of prednisone  given her history of asthma.  Since then, patient endorses taking medications as directed.  Notes her fever lasted for 2 days and then resolved.  Body aches are mostly resolved with just some residual muscle soreness in her legs.  Notes she feels she is having an issue tolerating the medications.  States they make her feel slightly dizzy and give her a bad headache.  Denies any chest pain or shortness of breath.  Cough is sometimes productive, mainly in the morning, but other times dry.  Does have her albuterol  inhaler which she has been using as directed.   HPI: HPI  Problems:  Patient Active Problem List   Diagnosis Date Noted   Dysuria 02/15/2023   Obesity (BMI 30-39.9) 02/15/2023   Abnormal  uterine and vaginal bleeding, unspecified 01/14/2023   CIN I (cervical intraepithelial neoplasia I) 01/14/2023   Endometrial mass 01/14/2023   Spinal arachnoid cyst 01/14/2023   Urine pregnancy test negative 01/14/2023   Insomnia 01/14/2023   Acute viral bronchitis 04/27/2022   Head ache 07/18/2021   Encounter for routine adult physical exam with abnormal findings 06/14/2021   Need for Tdap vaccination 06/14/2021   Lightheadedness 06/14/2021    Hematemesis 07/26/2020   Iron  deficiency anemia 07/25/2020   URI (upper respiratory infection) 07/01/2020   Lower respiratory tract infection 05/23/2020   IDA (iron  deficiency anemia) 02/03/2020   Heartburn 02/03/2020   Complaint of melena 12/10/2019   Arachnoid cyst 09/11/2018   Carpal tunnel syndrome of right wrist 07/10/2018   Cubital tunnel syndrome 07/10/2018   Degeneration of lumbar intervertebral disc 07/10/2018   Low back pain 07/10/2018   Scoliosis deformity of spine 07/10/2018   Mass of joint of right wrist 07/15/2017   Iron  deficiency anemia of pregnancy 06/27/2015   Dyslipidemia 10/21/2012   Prediabetes 10/21/2012   Gastroesophageal reflux disease 01/30/2011    Allergies:  Allergies  Allergen Reactions   Penicillins Diarrhea and Nausea And Vomiting    Has patient had a PCN reaction causing immediate rash, facial/tongue/throat swelling, SOB or lightheadedness with hypotension: No Has patient had a PCN reaction causing severe rash involving mucus membranes or skin necrosis: No Has patient had a PCN reaction that required hospitalization No Has patient had a PCN reaction occurring within the last 10 years: Yes If all of the above answers are NO, then may proceed with Cephalosporin use.    Medications:  Current Outpatient Medications:    benzonatate  (TESSALON ) 100 MG capsule, Take 1 capsule (100 mg total) by mouth 3 (three) times daily as needed for cough., Disp: 30 capsule, Rfl: 0   acetaminophen  (TYLENOL ) 500 MG tablet, Take 500-1,000 mg by mouth every 6 (six) hours as needed (pain.)., Disp: , Rfl:    albuterol  (PROVENTIL ) (2.5 MG/3ML) 0.083% nebulizer solution, Take 3 mLs (2.5 mg total) by nebulization every 6 (six) hours as needed for wheezing or shortness of breath., Disp: 150 mL, Rfl: 1   albuterol  (VENTOLIN  HFA) 108 (90 Base) MCG/ACT inhaler, Inhale 1-2 puffs into the lungs every 6 (six) hours as needed for wheezing or shortness of breath., Disp: 18 g, Rfl: 1    budesonide -formoterol  (SYMBICORT ) 80-4.5 MCG/ACT inhaler, Inhale 2 puffs into the lungs in the morning and at bedtime., Disp: 1 each, Rfl: 1   Cholecalciferol (VITAMIN D3) 25 MCG (1000 UT) CAPS, Take 1 capsule (1,000 Units total) by mouth daily., Disp: 60 capsule, Rfl: 3   estradiol (VIVELLE-DOT) 0.025 MG/24HR, 1 patch 2 (two) times a week., Disp: , Rfl:    hydrOXYzine  (ATARAX ) 10 MG tablet, Take 1 tablet (10 mg total) by mouth at bedtime as needed., Disp: 30 tablet, Rfl: 0   Levonorgestrel  (MIRENA , 52 MG, IU), Mirena , Disp: , Rfl:    Multiple Vitamin (MULTIVITAMIN) tablet, Take 1 tablet by mouth daily., Disp: , Rfl:    pantoprazole  (PROTONIX ) 40 MG tablet, Take 1 tablet (40 mg total) by mouth daily as needed., Disp: 30 tablet, Rfl: 3   phentermine  37.5 MG capsule, Take 1 capsule (37.5 mg total) by mouth every morning., Disp: 30 capsule, Rfl: 3   Respiratory Therapy Supplies (NEBULIZER MASK ADULT) MISC, 1 each by Does not apply route as needed., Disp: 1 each, Rfl: 0  Observations/Objective: Patient is well-developed, well-nourished in no acute distress.  Resting comfortably  at home.  Head is normocephalic, atraumatic.  No labored breathing. Speech is clear and coherent with logical content.  Patient is alert and oriented at baseline.   Assessment and Plan: 1. Influenza (Primary) - benzonatate  (TESSALON ) 100 MG capsule; Take 1 capsule (100 mg total) by mouth 3 (three) times daily as needed for cough.  Dispense: 30 capsule; Refill: 0  2. Medication side effect  Concern for side effect from medication, most likely this Tamiflu.  Will have her stop this immediately especially giving her aches and fever have mainly resolved.  Residual URI symptoms are mild to moderate.  Will have her continue prednisone  as she is taken before and had no issue.  Continue maintenance asthma medications and albuterol  as directed.  Will add on Tessalon  to further help control the cough.  Strict in person evaluation  precautions discussed with patient.  Work note offered and declined at present.  Follow Up Instructions: I discussed the assessment and treatment plan with the patient. The patient was provided an opportunity to ask questions and all were answered. The patient agreed with the plan and demonstrated an understanding of the instructions.  A copy of instructions were sent to the patient via MyChart unless otherwise noted below.   The patient was advised to call back or seek an in-person evaluation if the symptoms worsen or if the condition fails to improve as anticipated.    Martha Velma Lunger, PA-C

## 2023-08-22 NOTE — Progress Notes (Unsigned)
   Established Patient Office Visit   Subjective  Patient ID: Martha [name], female    DOB: 29-Dec-1980  Age: 43 y.o. MRN: [id number]  No chief complaint on file.   She  has a past medical history of Abnormal uterine bleeding (AUB), Asthma, GERD (gastroesophageal reflux disease), Hiatal hernia, History of cervical dysplasia (2005), Hyperlipidemia, Iron deficiency anemia due to chronic blood loss, Pre-diabetes, Spinal arachnoid cyst (2020), and Wears glasses.  HPI  ROS    Objective:     There were no vitals taken for this visit. {Vitals History (Optional):23777}  Physical Exam   No results found for any visits on 08/23/23.  The 10-year ASCVD risk score (Arnett DK, et al., 2019) is: 0.9%    Assessment & Plan:  There are no diagnoses linked to this encounter.  No follow-ups on file.   Cruzita Lederer Newman Nip, FNP

## 2023-08-22 NOTE — Patient Instructions (Signed)

## 2023-08-23 ENCOUNTER — Ambulatory Visit (INDEPENDENT_AMBULATORY_CARE_PROVIDER_SITE_OTHER): Admitting: Family Medicine

## 2023-08-23 ENCOUNTER — Encounter: Payer: Self-pay | Admitting: Family Medicine

## 2023-08-23 VITALS — BP 118/81 | HR 92 | Resp 16 | Ht 66.0 in | Wt 198.0 lb

## 2023-08-23 DIAGNOSIS — E038 Other specified hypothyroidism: Secondary | ICD-10-CM

## 2023-08-23 DIAGNOSIS — E559 Vitamin D deficiency, unspecified: Secondary | ICD-10-CM

## 2023-08-23 DIAGNOSIS — E66811 Obesity, class 1: Secondary | ICD-10-CM

## 2023-08-23 DIAGNOSIS — E782 Mixed hyperlipidemia: Secondary | ICD-10-CM | POA: Diagnosis not present

## 2023-08-23 DIAGNOSIS — Z6831 Body mass index (BMI) 31.0-31.9, adult: Secondary | ICD-10-CM | POA: Diagnosis not present

## 2023-08-23 DIAGNOSIS — E669 Obesity, unspecified: Secondary | ICD-10-CM

## 2023-08-23 MED ORDER — PHENTERMINE HCL 37.5 MG PO CAPS: 37.5000 mg | ORAL_CAPSULE | ORAL | 3 refills | Status: AC

## 2023-08-23 NOTE — Assessment & Plan Note (Signed)
 Start Phentermine 37.5 once daily before breakfast Discussed Eat a Balanced Diet: Focus on whole, nutrient-dense foods like lean proteins, vegetables, fruits, whole grains, and healthy fats while avoiding processed and sugary foods. Stay Active: Incorporate at least 30 minutes of moderate physical activity most days of the week, such as walking, jogging, or strength training. Hydrate and Rest: Drink plenty of water throughout the day and ensure you get 7-9 hours of quality sleep each night to support metabolism and recovery. Practice Portion Control: Use smaller plates, measure portions, and eat mindfully to avoid overeating and manage calorie intake effectively.

## 2023-08-26 ENCOUNTER — Encounter: Payer: Self-pay | Admitting: Hematology

## 2023-08-26 ENCOUNTER — Telehealth: Payer: Self-pay | Admitting: Pharmacy Technician

## 2023-08-26 ENCOUNTER — Other Ambulatory Visit (HOSPITAL_COMMUNITY): Payer: Self-pay

## 2023-08-26 NOTE — Telephone Encounter (Signed)
 Pharmacy Patient Advocate Encounter   Received notification from Onbase that prior authorization for PHENTERMINE 37.5MG  CAPSULES is required/requested.   Insurance verification completed.   The patient is insured through [facility] .   Per test claim: PA required; PA submitted to above mentioned insurance via CoverMyMeds Key/confirmation #/EOC [id number] Status is pending

## 2023-08-26 NOTE — Telephone Encounter (Signed)
 Pharmacy Patient Advocate Encounter  Received notification from [facility] that Prior Authorization for PHENTERMINE 37.5MG  CAPSULES  has been APPROVED from 08/25/2023 to 11/25/2023. Ran test claim, Copay is $4.26. This test claim was processed through [facility]- copay amounts may vary at other pharmacies due to pharmacy/plan contracts, or as the patient moves through the different stages of their insurance plan.   PA #/Case ID/Reference #: [id number]

## 2023-09-12 ENCOUNTER — Encounter: Payer: Self-pay | Admitting: Hematology

## 2023-09-16 NOTE — H&P (Signed)
 Martha [name] is an 43 y.o. [phone] s/p BTL who is admitted for Robotic Assisted Total Laparoscopic Hysterectomy and Bilateral Salpingectomy for AUB.  Patient has had a long-standing history of multiple years of heavy bleeding requiring iron infusions. Previously on OCPs but states estrogen makes her ill and Depo caused bone loss. She underwent Hysteroscopy D&C in 2023 due to suspected polyp noted on ultrasound (SIS) - final pathology as documented below. She tried Mirena IUD placement 07/19/2022 for management of AUB; however, continued to have abnormal bleeding and had it removed 01/2023. She has had continued heavy menses and desires definitive management at this juncture as her quality of daily life is affected.  Work-up: Pap smear (09/19/2023): NILM/HRHPV negative  CBC (09/16/23): Hgb 10.9  HCT 34.7  Hysteroscopy D&C (01/31/2022): FINAL MICROSCOPIC DIAGNOSIS:   A. ENDOMETRIUM, POLYP, CURETTAGE:  - Proliferative endometrium.  - Benign endometrial polyp.  - Negative for hyperplasia or malignancy.   12/05/2022 TVUS Uterus: 8.47 x 4.18 x 4.41 cm Endometrial thickness: 0.25 cm Right Ovary 1.59 cm  Left Ovary 3.04 cm Comments: Anteverted uterus. No uterine anomalies seen. Endometrium thin.IUD seen in proper place. Right ovary WNL. Left ovary WNL- follicle 1.0 cm.No adnexal masses seen.  12/19/2021 TVUS        Uterus 9.01 x 4.28 x 4.68cm        Endometrium 0.94cm        Right ovary 2.97cm  Left 3.67        Anteverted uterus. No uterine anomalies seen. Endometrium thickened with hyperechoic mass posterior wall - 1.0 x 0.6cm, no blood flow noted. Right ovary WNL - follicle less than 1cm. Left ovary - complex cyst 1.7 x 1.5 x  1.3cm, avascular. No adnexal masses seen. SHGM - following saline infusion, hyperechoic mass seen posterior wall 1.5 x 0.9 x 0.6cm. Endometrial walls thin and smooth - anterior wall 0.2cm, posterior wall 0.2cm; total EM thickness 0.4cm.Vital Signs   Patient Active Problem  List   Diagnosis Date Noted   Dysuria 02/15/2023   Obesity (BMI 30-39.9) 02/15/2023   Abnormal uterine and vaginal bleeding, unspecified 01/14/2023   CIN I (cervical intraepithelial neoplasia I) 01/14/2023   Endometrial mass 01/14/2023   Spinal arachnoid cyst 01/14/2023   Urine pregnancy test negative 01/14/2023   Insomnia 01/14/2023   Acute viral bronchitis 04/27/2022   Head ache 07/18/2021   Encounter for routine adult physical exam with abnormal findings 06/14/2021   Need for Tdap vaccination 06/14/2021   Lightheadedness 06/14/2021   Hematemesis 07/26/2020   Iron deficiency anemia 07/25/2020   URI (upper respiratory infection) 07/01/2020   Lower respiratory tract infection 05/23/2020   IDA (iron deficiency anemia) 02/03/2020   Heartburn 02/03/2020   Complaint of melena 12/10/2019   Arachnoid cyst 09/11/2018   Carpal tunnel syndrome of right wrist 07/10/2018   Cubital tunnel syndrome 07/10/2018   Degeneration of lumbar intervertebral disc 07/10/2018   Low back pain 07/10/2018   Scoliosis deformity of spine 07/10/2018   Mass of joint of right wrist 07/15/2017   Iron deficiency anemia of pregnancy 06/27/2015   Dyslipidemia 10/21/2012   Prediabetes 10/21/2012   Gastroesophageal reflux disease 01/30/2011    MEDICAL/FAMILY/SOCIAL HX: No LMP recorded.    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   Hyperlipidemia  Iron deficiency anemia due to chronic blood loss    hematology/ oncology--- Lupe Carney [place] PA,  treated w/ iv iron   Pre-diabetes    Spinal arachnoid cyst 2020   mild dorsal spinal cord flattening @ T2-3 intradural  (per pt followed by neuro surgeon, asymptomatic)   Wears glasses     Past Surgical History:  Procedure Laterality Date   BIOPSY  03/30/2020   Procedure: BIOPSY;   Surgeon: Corbin Ade, MD;  Location: [facility];  Service: Endoscopy;;   COLONOSCOPY N/A 03/30/2020   normal colon, normal TI.    DIAGNOSTIC LAPAROSCOPY  09/09/2001   @[facility] ;  AND D&C HYSTEROSCOPY   DILATATION & CURETTAGE/HYSTEROSCOPY WITH MYOSURE N/A 01/31/2022   Procedure: DILATATION & CURETTAGE/HYSTEROSCOPY WITH MYOSURE;  Surgeon: [name], DO;  Location: [facility];  Service: Gynecology;  Laterality: N/A;   ESOPHAGOGASTRODUODENOSCOPY  01/2010   Dr. Jena Gauss: small hiatal hernia   ESOPHAGOGASTRODUODENOSCOPY N/A 03/30/2020   mild erosive reflux esophagitis, medium-sized hiatal hernia s/p biopsy, normal duodenum. Schatzki's ring. Negative H.pylori.    LASER ABLATION OF THE CERVIX  12/14/2003   @[facility]   (CO2 laser vaporization of CIN)   TUBAL LIGATION Bilateral 06/26/2015   Procedure: POST PARTUM TUBAL LIGATION;  Surgeon: Maxie Better, MD;  Location: [facility];  Service: Gynecology;  Laterality: Bilateral;   WRIST GANGLION EXCISION  07/22/2009   @[facility]     Family History  Problem Relation Age of Onset   Asthma Mother    Allergies Mother    Diabetes Mother        prediabetes   Myasthenia gravis Father    Bladder Cancer Father        diagnosed at 47   Hyperlipidemia Father    Migraines Sister    Cancer Maternal Aunt        facial   Diabetes Paternal Uncle    Cancer Maternal Grandmother        stomach   Heart disease Paternal Grandmother    Heart disease Paternal Grandfather    Colon cancer Neg Hx    Inflammatory bowel disease Neg Hx    Celiac disease Neg Hx    Breast cancer Neg Hx    Lung cancer Neg Hx     Social History:  reports that she has never smoked. She has never used smokeless tobacco. She reports current alcohol use. She reports that she does not use drugs.  ALLERGIES/MEDS:  Allergies:  Allergies  Allergen Reactions   Penicillins Diarrhea and Nausea And Vomiting    Has patient had a PCN reaction causing immediate rash,  facial/tongue/throat swelling, SOB or lightheadedness with hypotension: No Has patient had a PCN reaction causing severe rash involving mucus membranes or skin necrosis: No Has patient had a PCN reaction that required hospitalization No Has patient had a PCN reaction occurring within the last 10 years: Yes If all of the above answers are "NO", then may proceed with Cephalosporin use.     No medications prior to admission.     Review of Systems  All other systems reviewed and are negative.   There were no vitals taken for this visit. Gen:  NAD, pleasant and cooperative Cardio:  RRR Pulm:  CTAB, no wheezes/rales/rhonchi Abd:  Soft, non-distended, non-tender throughout, no rebound/guarding Ext:  No bilateral LE edema, no bilateral calf tenderness Pelvic: Labia - unremarkable, vagina - pink moist mucosa, no lesions or abnormal discharge, cervix - no discharge or lesions or CMT, adnexa - no masses or tenderness -  uterus non-tender and normal size on palpation  No results found for this or any previous visit (from the past 24 hours).  No results found.   ASSESSMENT/PLAN: Martha [name] is a 43 y.o. [id number] who is admitted for Robotic Assisted Total Laparoscopic Hysterectomy and Bilateral Salpingectomy for AUB.   - Admit to [facility] Main OR - Admit labs (CBC, T&S) - Diet:  Per anesthesia/ERAS pathway - IVF:  per anesthesia - VTE Prophylaxis:  SCDs - Antibiotics: Ancef 2g on call to OR - D/C home POD#0-1  Consents: I have explained to the patient that his surgery is performed to remove the uterus through several small incisions in the abdomen and that it will result in sterility.  I discussed the risks and benefits of the surgery, including, but not limited to bleeding, including the need for blood transfusion, infection, damage to surround organs and tissues, damage to bladder, damage to ureters, causing kidney damage, and requiring additional procedures, damage to bowels, resulting in  further surgery, postoperative pain, short-term and long-term, scarring on the abdominal wall and intra-abdominally, need for further surgery, need for conversion to an open procedure, development of an incisional hernia, deep vein thrombosis, and/or pulmonary embolism, wound infection and/or separation, painful intercourse, urinary leakage, ovarian failure, resulting in menopausal symptoms requiring treatment, fistula formation, complications the course of which cannot be predicted or prevented, and death.  Patient was consented for blood products.  The patient is aware that bleeding may result in the need for a blood transfusion which includes risk of transmission of HIV (1:2 million), Hepatitis C (1:2 million), and Hepatitis B (1:200 thousand) and transfusion reaction.  Patient voiced understanding of the above risks as well as understanding of indications for blood transfusion.  [name], DO

## 2023-09-17 ENCOUNTER — Encounter: Payer: Self-pay | Admitting: Family Medicine

## 2023-09-17 LAB — CBC WITH DIFFERENTIAL/PLATELET
Basophils Absolute: 0 10*3/uL (ref 0.0–0.2)
Basos: 0 %
EOS (ABSOLUTE): 0.1 10*3/uL (ref 0.0–0.4)
Eos: 1 %
Hematocrit: 34.7 % (ref 34.0–46.6)
Hemoglobin: 10.9 g/dL — ABNORMAL LOW (ref 11.1–15.9)
Immature Grans (Abs): 0 10*3/uL (ref 0.0–0.1)
Immature Granulocytes: 0 %
Lymphocytes Absolute: 1.6 10*3/uL (ref 0.7–3.1)
Lymphs: 20 %
MCH: 26.4 pg — ABNORMAL LOW (ref 26.6–33.0)
MCHC: 31.4 g/dL — ABNORMAL LOW (ref 31.5–35.7)
MCV: 84 fL (ref 79–97)
Monocytes Absolute: 0.6 10*3/uL (ref 0.1–0.9)
Monocytes: 8 %
Neutrophils Absolute: 5.5 10*3/uL (ref 1.4–7.0)
Neutrophils: 71 %
Platelets: 223 10*3/uL (ref 150–450)
RBC: 4.13 x10E6/uL (ref 3.77–5.28)
RDW: 14.4 % (ref 11.7–15.4)
WBC: 7.9 10*3/uL (ref 3.4–10.8)

## 2023-09-17 LAB — LIPID PANEL
Chol/HDL Ratio: 2.9 ratio (ref 0.0–4.4)
Cholesterol, Total: 160 mg/dL (ref 100–199)
HDL: 56 mg/dL (ref >39)
LDL Chol Calc (NIH): 90 mg/dL (ref 0–99)
Triglycerides: 71 mg/dL (ref 0–149)
VLDL Cholesterol Cal: 14 mg/dL (ref 5–40)

## 2023-09-17 LAB — VITAMIN D 25 HYDROXY (VIT D DEFICIENCY, FRACTURES): Vit D, 25-Hydroxy: 26.7 ng/mL — ABNORMAL LOW (ref 30.0–100.0)

## 2023-09-17 LAB — BMP8+EGFR
BUN/Creatinine Ratio: 9 (ref 9–23)
BUN: 7 mg/dL (ref 6–24)
CO2: 23 mmol/L (ref 20–29)
Calcium: 9.2 mg/dL (ref 8.7–10.2)
Chloride: 104 mmol/L (ref 96–106)
Creatinine, Ser: 0.8 mg/dL (ref 0.57–1.00)
Glucose: 76 mg/dL (ref 70–99)
Potassium: 4.6 mmol/L (ref 3.5–5.2)
Sodium: 139 mmol/L (ref 134–144)
eGFR: 95 mL/min/{1.73_m2} (ref >59)

## 2023-09-17 LAB — TSH+FREE T4
Free T4: 0.99 ng/dL (ref 0.82–1.77)
TSH: 2.1 u[IU]/mL (ref 0.450–4.500)

## 2023-09-23 ENCOUNTER — Encounter (HOSPITAL_COMMUNITY): Payer: Self-pay | Admitting: Obstetrics and Gynecology

## 2023-09-23 NOTE — Pre-Procedure Instructions (Signed)
 Surgical Instructions   Your procedure is scheduled on Wednesday,  10-02-2023. Report to [facility] Main Entrance "A" at 8:00 A.M., then check in with the Admitting office. Any questions or running late day of surgery: call [phone]  Questions prior to your surgery date: call [phone], Monday-Friday, 8am-4pm. If you experience any cold or flu symptoms such as cough, fever, chills, shortness of breath, etc. between now and your scheduled surgery, please notify your surgeon office.    Remember:  Do not eat any food after midnight the night before your surgery.  You may have clear liquids from midnight night before surgery until 7:00 AM.   You may drink clear liquids until 7:00 AM the morning of your surgery.   Clear liquids allowed are:  Water Carbonated Beverages (diet only) Clear Tea (may use non-sweetener, no milk, honey, etc.) Black Coffee Only (may use non-sweetener, NO MILK, CREAM OR POWDERED CREAMER of any kind) Sports drink like Gatorade that her low sugar or sugar free NO clear liquids after 7:00 AM day of surgery.  This includes no water,  candy,  gum,  and  mints.    Take these medicines the morning of surgery with A SIP OF WATER : Pantoprazole (protonix)   May take these medicines IF NEEDED: Albuterol (ventolin) inhaler Please bring albuterol inhaler and symbicort inhaler with you day of surgery.    One week prior to surgery, STOP taking any Aspirin (unless otherwise instructed by your surgeon) Aleve, Naproxen, Ibuprofen, Motrin, Advil, Goody's, BC's, all herbal medications, fish oil, and non-prescription vitamins.            You will be asked to remove any contacts, glasses, piercing's, hearing aid's, dentures/partials prior to surgery. Please bring cases for these items if needed.    Patients discharged the day of surgery will not be allowed to drive home, and someone needs to stay with them for 24 hours.  SURGICAL WAITING ROOM VISITATION Patients may  have no more than 2 support people in the waiting area - these visitors may rotate.   Pre-op nurse will coordinate an appropriate time for 1 ADULT support person, who may not rotate, to accompany patient in pre-op.  Children under the age of 53 must have an adult with them who is not the patient and must remain in the main waiting area with an adult.  If the patient needs to stay at the hospital during part of their recovery, the visitor guidelines for inpatient rooms apply.  Please refer to the [facility] website for the visitor guidelines for any additional information.   If you received a COVID test during your pre-op visit  it is requested that you wear a mask when out in public, stay away from anyone that may not be feeling well and notify your surgeon if you develop symptoms. If you have been in contact with anyone that has tested positive in the last 10 days please notify you surgeon.      Pre-operative CHG Bathing Instructions   You can play a key role in reducing the risk of infection after surgery. Your skin needs to be as free of germs as possible. You can reduce the number of germs on your skin by washing with CHG (chlorhexidine gluconate) soap before surgery. CHG is an antiseptic soap that kills germs and continues to kill germs even after washing.   DO NOT use if you have an allergy to chlorhexidine/CHG or antibacterial soaps. If your skin becomes reddened or irritated, stop  using the CHG and notify Pre-Op nurse day of surgery.  If you have skin irritation or problems with the surgical soap (CHG), do not use.  Please get a bar of dial soap or antibacterial soap and shower following the instructions below.             TAKE A SHOWER THE NIGHT BEFORE SURGERY AND THE DAY OF SURGERY    Please keep in mind the following:  DO NOT shave, including legs and underarms, 48 hours prior to surgery.   You may shave your face before/day of surgery.  Place clean sheets on your bed the night  before surgery Use a clean washcloth (not used since being washed) for each shower. DO NOT sleep with pet's night before surgery.  CHG Shower Instructions:  Wash your face and private area with normal soap. If you choose to wash your hair, wash first with your normal shampoo.  After you use shampoo/soap, rinse your hair and body thoroughly to remove shampoo/soap residue.  Turn the water OFF and apply half the bottle of CHG soap to a CLEAN washcloth.  Apply CHG soap ONLY FROM YOUR NECK DOWN TO YOUR TOES (washing for 3-5 minutes)  DO NOT use CHG soap on face, private areas, open wounds, or sores.  Pay special attention to the area where your surgery is being performed.  If you are having back surgery, having someone wash your back for you may be helpful. Wait 2 minutes after CHG soap is applied, then you may rinse off the CHG soap.  Pat dry with a clean towel  Put on clean pajamas    Additional instructions for the day of surgery: DO NOT APPLY any lotions, oils, deodorants (may use underarm deodorant) , cologne/ perfumes or makeup Do not wear jewelry / piercing's/ metal/  permanent jewelry must be removed prior to arrival  (this includes plastic jewelry) Do not wear nail polish, gel polish, artificial nails, or any other type of covering on natural nails (fingers and toes) Do not bring valuables to the hospital. [facility] is not responsible for valuables/personal belongings. Put on clean/comfortable clothes.  Please brush your teeth.  Ask your nurse before applying any prescription medications to the skin.

## 2023-09-23 NOTE — Progress Notes (Signed)
 Spoke w/ via phone for pre-op interview--- pt Lab needs dos----    urine preg     Lab results------ lab appt 10-01-2023 @ 1300 getting CBC/ T&S COVID test -----patient states asymptomatic no test needed Arrive at ------- 0800 on 10-02-2023 NPO after MN NO Solid Food.  Clear liquids from MN until--- 0700 Pre-Surgery Ensure or G2: n/a ERAS protocol:  yes  Med rec completed Medications to take morning of surgery ----- protonix Diabetic medication ----- n/a  GLP1 agonist last dose: n/a GLP1 instructions:  Patient instructed no nail polish to be worn day of surgery Patient instructed to bring photo id and insurance card day of surgery Patient aware to have Driver (ride ) / caregiver    for 24 hours after surgery -  husband, hunter Patient Special Instructions ----- will pick up bag w/ CHG and written instructions at lab appt Pre-Op special Instructions ----- n/a  Patient verbalized understanding of instructions that were given at this phone interview. Patient denies chest pain, sob, fever, cough at the interview.   Anesthesia Review:  no;  asthma (stated last used nebulizer and rescue inhaler feb 2025)  PCP:  Wylene Men NP (lov 08-23-2023) Hematologist:  Rojelio Brenner PA (lov 06-05-2023 (last IV iron infusion 06-17-2023)   Chest x-ray : >2 yrs EKG : 03-08-2011 Echo : no Stress test:no Cardiac Cath : no  Activity level: denies sob w/ any activity Sleep Study/ CPAP : Fasting Blood Sugar   / Checks Blood Sugar -- times a day:  pre-diabetes ,  does not check  Blood Thinner/ Instructions /Last Dose: no ASA / Instructions/ Last Dose : no

## 2023-10-01 ENCOUNTER — Encounter (HOSPITAL_COMMUNITY)
Admission: RE | Admit: 2023-10-01 | Discharge: 2023-10-01 | Disposition: A | Discharge status: Home / Self Care | Source: Ambulatory Visit | Attending: Obstetrics and Gynecology | Admitting: Obstetrics and Gynecology

## 2023-10-01 DIAGNOSIS — Z01812 Encounter for preprocedural laboratory examination: Secondary | ICD-10-CM | POA: Insufficient documentation

## 2023-10-01 DIAGNOSIS — N939 Abnormal uterine and vaginal bleeding, unspecified: Secondary | ICD-10-CM | POA: Diagnosis not present

## 2023-10-01 LAB — CBC
HCT: 38 % (ref 36.0–46.0)
Hemoglobin: 11.6 g/dL — ABNORMAL LOW (ref 12.0–15.0)
MCH: 26.1 pg (ref 26.0–34.0)
MCHC: 30.5 g/dL (ref 30.0–36.0)
MCV: 85.4 fL (ref 80.0–100.0)
Platelets: 260 10*3/uL (ref 150–400)
RBC: 4.45 MIL/uL (ref 3.87–5.11)
RDW: 14.3 % (ref 11.5–15.5)
WBC: 8.1 10*3/uL (ref 4.0–10.5)
nRBC: 0 % (ref 0.0–0.2)

## 2023-10-01 LAB — TYPE AND SCREEN
ABO/RH(D): O POS
Antibody Screen: NEGATIVE

## 2023-10-02 ENCOUNTER — Ambulatory Visit (HOSPITAL_COMMUNITY)
Admission: RE | Admit: 2023-10-02 | Discharge: 2023-10-02 | Disposition: A | Discharge status: Home / Self Care | Attending: Obstetrics and Gynecology | Admitting: Obstetrics and Gynecology

## 2023-10-02 ENCOUNTER — Encounter (HOSPITAL_COMMUNITY)
Admission: RE | Disposition: A | Discharge status: Home / Self Care | Payer: Self-pay | Source: Home / Self Care | Attending: Obstetrics and Gynecology

## 2023-10-02 ENCOUNTER — Encounter (HOSPITAL_COMMUNITY): Payer: Self-pay | Admitting: Obstetrics and Gynecology

## 2023-10-02 ENCOUNTER — Ambulatory Visit (HOSPITAL_BASED_OUTPATIENT_CLINIC_OR_DEPARTMENT_OTHER): Payer: Self-pay | Admitting: Anesthesiology

## 2023-10-02 ENCOUNTER — Other Ambulatory Visit: Payer: Self-pay

## 2023-10-02 ENCOUNTER — Ambulatory Visit (HOSPITAL_COMMUNITY): Payer: Self-pay | Admitting: Anesthesiology

## 2023-10-02 DIAGNOSIS — Z01818 Encounter for other preprocedural examination: Secondary | ICD-10-CM

## 2023-10-02 DIAGNOSIS — G8918 Other acute postprocedural pain: Secondary | ICD-10-CM | POA: Insufficient documentation

## 2023-10-02 DIAGNOSIS — N939 Abnormal uterine and vaginal bleeding, unspecified: Secondary | ICD-10-CM

## 2023-10-02 DIAGNOSIS — K219 Gastro-esophageal reflux disease without esophagitis: Secondary | ICD-10-CM | POA: Insufficient documentation

## 2023-10-02 DIAGNOSIS — Z9851 Tubal ligation status: Secondary | ICD-10-CM | POA: Insufficient documentation

## 2023-10-02 DIAGNOSIS — D500 Iron deficiency anemia secondary to blood loss (chronic): Secondary | ICD-10-CM | POA: Diagnosis not present

## 2023-10-02 DIAGNOSIS — R1033 Periumbilical pain: Secondary | ICD-10-CM | POA: Diagnosis not present

## 2023-10-02 HISTORY — PX: ROBOTIC ASSISTED LAPAROSCOPIC HYSTERECTOMY AND SALPINGECTOMY: SHX6379

## 2023-10-02 HISTORY — DX: Mixed hyperlipidemia: E78.2

## 2023-10-02 HISTORY — DX: Vitamin D deficiency, unspecified: E55.9

## 2023-10-02 HISTORY — PX: ABDOMINAL HYSTERECTOMY: SHX81

## 2023-10-02 HISTORY — DX: Mild persistent asthma, uncomplicated: J45.30

## 2023-10-02 LAB — POCT PREGNANCY, URINE: Preg Test, Ur: NEGATIVE

## 2023-10-02 SURGERY — XI ROBOTIC ASSISTED LAPAROSCOPIC HYSTERECTOMY AND SALPINGECTOMY
Anesthesia: General | Site: Abdomen | Laterality: Bilateral

## 2023-10-02 MED ORDER — MORPHINE SULFATE (PF) 2 MG/ML IV SOLN: 1.0000 mg | INTRAVENOUS | Status: DC | PRN

## 2023-10-02 MED ORDER — SODIUM CHLORIDE 0.9 % IV SOLN
INTRAVENOUS | Status: DC | PRN
  Administered 2023-10-02: 85 mL

## 2023-10-02 MED ORDER — SODIUM CHLORIDE (PF) 0.9 % IJ SOLN
INTRAMUSCULAR | Status: AC
  Filled 2023-10-02: qty 10

## 2023-10-02 MED ORDER — IBUPROFEN 200 MG PO TABS: 800.0000 mg | ORAL_TABLET | Freq: Three times a day (TID) | ORAL | Status: DC

## 2023-10-02 MED ORDER — HEMOSTATIC AGENTS (NO CHARGE) OPTIME
TOPICAL | Status: DC | PRN
  Administered 2023-10-02: 1

## 2023-10-02 MED ORDER — LIDOCAINE 2% (20 MG/ML) 5 ML SYRINGE
INTRAMUSCULAR | Status: DC | PRN
  Administered 2023-10-02: 100 mg via INTRAVENOUS

## 2023-10-02 MED ORDER — CHLORHEXIDINE GLUCONATE 0.12 % MT SOLN
15.0000 mL | Freq: Once | OROMUCOSAL | Status: AC
  Administered 2023-10-02: 15 mL via OROMUCOSAL

## 2023-10-02 MED ORDER — HYDROMORPHONE HCL 1 MG/ML IJ SOLN
0.2500 mg | Freq: Once | INTRAMUSCULAR | Status: AC
  Administered 2023-10-02: 0.25 mg via INTRAVENOUS

## 2023-10-02 MED ORDER — ONDANSETRON HCL 4 MG/2ML IJ SOLN
INTRAMUSCULAR | Status: DC | PRN
  Administered 2023-10-02: 4 mg via INTRAVENOUS

## 2023-10-02 MED ORDER — LACTATED RINGERS IV SOLN: INTRAVENOUS | Status: DC

## 2023-10-02 MED ORDER — FENTANYL CITRATE (PF) 100 MCG/2ML IJ SOLN
25.0000 ug | INTRAMUSCULAR | Status: DC | PRN
  Administered 2023-10-02 (×3): 25 ug via INTRAVENOUS

## 2023-10-02 MED ORDER — ROCURONIUM BROMIDE 10 MG/ML (PF) SYRINGE
PREFILLED_SYRINGE | INTRAVENOUS | Status: DC | PRN
  Administered 2023-10-02: 70 mg via INTRAVENOUS
  Administered 2023-10-02: 10 mg via INTRAVENOUS

## 2023-10-02 MED ORDER — ORAL CARE MOUTH RINSE: 15.0000 mL | Freq: Once | OROMUCOSAL | Status: AC

## 2023-10-02 MED ORDER — HYDROMORPHONE HCL 1 MG/ML IJ SOLN
INTRAMUSCULAR | Status: DC
Start: 2023-10-02 — End: 2023-10-03
  Filled 2023-10-02: qty 1

## 2023-10-02 MED ORDER — PHENYLEPHRINE 80 MCG/ML (10ML) SYRINGE FOR IV PUSH (FOR BLOOD PRESSURE SUPPORT)
PREFILLED_SYRINGE | INTRAVENOUS | Status: AC
  Filled 2023-10-02: qty 10

## 2023-10-02 MED ORDER — HYDROMORPHONE HCL 1 MG/ML IJ SOLN
INTRAMUSCULAR | Status: AC
  Administered 2023-10-02: 0.25 mg via INTRAVENOUS
  Filled 2023-10-02: qty 1

## 2023-10-02 MED ORDER — CEFAZOLIN SODIUM-DEXTROSE 2-4 GM/100ML-% IV SOLN
INTRAVENOUS | Status: AC
  Filled 2023-10-02: qty 100

## 2023-10-02 MED ORDER — OXYCODONE HCL 5 MG/5ML PO SOLN: 5.0000 mg | Freq: Once | ORAL | Status: DC | PRN

## 2023-10-02 MED ORDER — PHENYLEPHRINE 80 MCG/ML (10ML) SYRINGE FOR IV PUSH (FOR BLOOD PRESSURE SUPPORT)
PREFILLED_SYRINGE | INTRAVENOUS | Status: DC | PRN
  Administered 2023-10-02: 160 ug via INTRAVENOUS
  Administered 2023-10-02 (×2): 80 ug via INTRAVENOUS
  Administered 2023-10-02: 120 ug via INTRAVENOUS

## 2023-10-02 MED ORDER — HYDROMORPHONE HCL 1 MG/ML IJ SOLN
0.5000 mg | Freq: Once | INTRAMUSCULAR | Status: AC
  Administered 2023-10-02: 0.5 mg via INTRAVENOUS

## 2023-10-02 MED ORDER — MIDAZOLAM HCL 2 MG/2ML IJ SOLN
INTRAMUSCULAR | Status: AC
  Filled 2023-10-02: qty 2

## 2023-10-02 MED ORDER — PROPOFOL 10 MG/ML IV BOLUS
INTRAVENOUS | Status: AC
  Filled 2023-10-02: qty 20

## 2023-10-02 MED ORDER — SIMETHICONE 80 MG PO CHEW: 80.0000 mg | CHEWABLE_TABLET | Freq: Once | ORAL | Status: AC

## 2023-10-02 MED ORDER — MENTHOL 3 MG MT LOZG
1.0000 | LOZENGE | OROMUCOSAL | Status: DC | PRN
  Filled 2023-10-02: qty 9

## 2023-10-02 MED ORDER — ONDANSETRON HCL 4 MG/2ML IJ SOLN: 4.0000 mg | Freq: Once | INTRAMUSCULAR | Status: DC | PRN

## 2023-10-02 MED ORDER — CYCLOBENZAPRINE HCL 10 MG PO TABS: 10.0000 mg | ORAL_TABLET | Freq: Once | ORAL | Status: AC

## 2023-10-02 MED ORDER — CHLORHEXIDINE GLUCONATE 0.12 % MT SOLN
OROMUCOSAL | Status: AC
  Filled 2023-10-02: qty 15

## 2023-10-02 MED ORDER — ONDANSETRON HCL 4 MG PO TABS
4.0000 mg | ORAL_TABLET | Freq: Four times a day (QID) | ORAL | Status: DC | PRN
Start: 2023-10-02 — End: 2023-10-03

## 2023-10-02 MED ORDER — SODIUM CHLORIDE (PF) 0.9 % IJ SOLN
INTRAMUSCULAR | Status: AC
  Filled 2023-10-02: qty 50

## 2023-10-02 MED ORDER — MEPERIDINE HCL 25 MG/ML IJ SOLN: 6.2500 mg | INTRAMUSCULAR | Status: DC | PRN

## 2023-10-02 MED ORDER — DOCUSATE SODIUM 100 MG PO CAPS: 100.0000 mg | ORAL_CAPSULE | Freq: Two times a day (BID) | ORAL | Status: DC

## 2023-10-02 MED ORDER — PROPOFOL 10 MG/ML IV BOLUS
INTRAVENOUS | Status: DC | PRN
  Administered 2023-10-02: 200 mg via INTRAVENOUS

## 2023-10-02 MED ORDER — ROPIVACAINE HCL 5 MG/ML IJ SOLN
INTRAMUSCULAR | Status: AC
  Filled 2023-10-02: qty 60

## 2023-10-02 MED ORDER — OXYCODONE HCL 5 MG PO TABS
5.0000 mg | ORAL_TABLET | Freq: Four times a day (QID) | ORAL | 0 refills | Status: DC | PRN
Start: 2023-10-02 — End: 2023-12-16

## 2023-10-02 MED ORDER — 0.9 % SODIUM CHLORIDE (POUR BTL) OPTIME
TOPICAL | Status: DC | PRN
  Administered 2023-10-02: 1000 mL

## 2023-10-02 MED ORDER — IBUPROFEN 800 MG PO TABS: 800.0000 mg | ORAL_TABLET | Freq: Three times a day (TID) | ORAL | 1 refills | Status: AC | PRN

## 2023-10-02 MED ORDER — LIDOCAINE 2% (20 MG/ML) 5 ML SYRINGE
INTRAMUSCULAR | Status: AC
  Filled 2023-10-02: qty 5

## 2023-10-02 MED ORDER — CYCLOBENZAPRINE HCL 10 MG PO TABS
ORAL_TABLET | ORAL | Status: AC
  Administered 2023-10-02: 10 mg via ORAL
  Filled 2023-10-02: qty 1

## 2023-10-02 MED ORDER — FENTANYL CITRATE (PF) 250 MCG/5ML IJ SOLN
INTRAMUSCULAR | Status: AC
  Filled 2023-10-02: qty 5

## 2023-10-02 MED ORDER — MIDAZOLAM HCL 2 MG/2ML IJ SOLN
INTRAMUSCULAR | Status: DC | PRN
  Administered 2023-10-02: 2 mg via INTRAVENOUS

## 2023-10-02 MED ORDER — FENTANYL CITRATE (PF) 100 MCG/2ML IJ SOLN
INTRAMUSCULAR | Status: AC
  Administered 2023-10-02: 25 ug via INTRAVENOUS
  Filled 2023-10-02: qty 2

## 2023-10-02 MED ORDER — CEFAZOLIN SODIUM-DEXTROSE 2-4 GM/100ML-% IV SOLN
2.0000 g | INTRAVENOUS | Status: AC
  Administered 2023-10-02: 2 g via INTRAVENOUS

## 2023-10-02 MED ORDER — OXYCODONE HCL 5 MG PO TABS
5.0000 mg | ORAL_TABLET | ORAL | Status: DC | PRN
  Administered 2023-10-02: 10 mg via ORAL
  Filled 2023-10-02: qty 2

## 2023-10-02 MED ORDER — ACETAMINOPHEN 500 MG PO TABS
1000.0000 mg | ORAL_TABLET | Freq: Four times a day (QID) | ORAL | Status: DC
  Administered 2023-10-02: 1000 mg via ORAL
  Filled 2023-10-02: qty 2

## 2023-10-02 MED ORDER — SUGAMMADEX SODIUM 200 MG/2ML IV SOLN
INTRAVENOUS | Status: DC | PRN
  Administered 2023-10-02: 200 mg via INTRAVENOUS

## 2023-10-02 MED ORDER — SODIUM CHLORIDE 0.9 % IR SOLN
Status: DC | PRN
  Administered 2023-10-02: 1000 mL

## 2023-10-02 MED ORDER — SODIUM CHLORIDE 0.9 % IV SOLN: INTRAVENOUS | Status: DC | PRN

## 2023-10-02 MED ORDER — DEXAMETHASONE SODIUM PHOSPHATE 10 MG/ML IJ SOLN
INTRAMUSCULAR | Status: DC | PRN
  Administered 2023-10-02: 10 mg via INTRAVENOUS

## 2023-10-02 MED ORDER — ONDANSETRON HCL 4 MG/2ML IJ SOLN
INTRAMUSCULAR | Status: AC
  Filled 2023-10-02: qty 2

## 2023-10-02 MED ORDER — ONDANSETRON HCL 4 MG/2ML IJ SOLN
4.0000 mg | Freq: Four times a day (QID) | INTRAMUSCULAR | Status: DC | PRN
  Administered 2023-10-02: 4 mg via INTRAVENOUS
  Filled 2023-10-02: qty 2

## 2023-10-02 MED ORDER — SIMETHICONE 80 MG PO CHEW
CHEWABLE_TABLET | ORAL | Status: AC
  Administered 2023-10-02: 80 mg via ORAL
  Filled 2023-10-02: qty 1

## 2023-10-02 MED ORDER — KETOROLAC TROMETHAMINE 30 MG/ML IJ SOLN
INTRAMUSCULAR | Status: DC | PRN
  Administered 2023-10-02: 30 mg via INTRAVENOUS

## 2023-10-02 MED ORDER — OXYCODONE HCL 5 MG PO TABS: 5.0000 mg | ORAL_TABLET | Freq: Once | ORAL | Status: DC | PRN

## 2023-10-02 MED ORDER — DEXAMETHASONE SODIUM PHOSPHATE 10 MG/ML IJ SOLN
INTRAMUSCULAR | Status: AC
  Filled 2023-10-02: qty 1

## 2023-10-02 MED ORDER — FENTANYL CITRATE (PF) 250 MCG/5ML IJ SOLN
INTRAMUSCULAR | Status: DC | PRN
Start: 2023-10-02 — End: 2023-10-02
  Administered 2023-10-02: 25 ug via INTRAVENOUS
  Administered 2023-10-02: 100 ug via INTRAVENOUS
  Administered 2023-10-02: 25 ug via INTRAVENOUS

## 2023-10-02 MED ORDER — SIMETHICONE 80 MG PO CHEW: 80.0000 mg | CHEWABLE_TABLET | Freq: Four times a day (QID) | ORAL | Status: DC | PRN

## 2023-10-02 MED ORDER — ROCURONIUM BROMIDE 10 MG/ML (PF) SYRINGE
PREFILLED_SYRINGE | INTRAVENOUS | Status: AC
  Filled 2023-10-02: qty 10

## 2023-10-02 MED ORDER — METHYLENE BLUE (ANTIDOTE) 1 % IV SOLN
INTRAVENOUS | Status: AC
  Filled 2023-10-02: qty 10

## 2023-10-02 MED ORDER — HYDROMORPHONE HCL 1 MG/ML IJ SOLN: 0.2500 mg | Freq: Once | INTRAMUSCULAR | Status: AC

## 2023-10-02 SURGICAL SUPPLY — 66 items
APPLICATOR ARISTA FLEXITIP XL (MISCELLANEOUS) IMPLANT
BARRIER ADHS 3X4 INTERCEED (GAUZE/BANDAGES/DRESSINGS) IMPLANT
CANNULA CAP OBTURATR AIRSEAL 8 (CAP) ×1 IMPLANT
CELLS DAT CNTRL 66122 CELL SVR (MISCELLANEOUS) IMPLANT
COVER BACK TABLE 60X90IN (DRAPES) ×1 IMPLANT
COVER TIP SHEARS 8 DVNC (MISCELLANEOUS) ×1 IMPLANT
DEFOGGER SCOPE WARMER CLEARIFY (MISCELLANEOUS) ×1 IMPLANT
DERMABOND ADVANCED .7 DNX12 (GAUZE/BANDAGES/DRESSINGS) ×1 IMPLANT
DILATOR CANAL MILEX (MISCELLANEOUS) IMPLANT
DRAPE ARM DVNC X/XI (DISPOSABLE) ×4 IMPLANT
DRAPE COLUMN DVNC XI (DISPOSABLE) ×1 IMPLANT
DRAPE SURG IRRIG POUCH 19X23 (DRAPES) ×1 IMPLANT
DRAPE UTILITY W/TAPE 26X15 (DRAPES) ×1 IMPLANT
DRIVER NDL MEGA 8 DVNC XI (INSTRUMENTS) ×1 IMPLANT
DRIVER NDLE MEGA DVNC XI (INSTRUMENTS) ×1 IMPLANT
DURAPREP 26ML APPLICATOR (WOUND CARE) ×1 IMPLANT
ELECT REM PT RETURN 9FT ADLT (ELECTROSURGICAL) ×1 IMPLANT
ELECTRODE REM PT RTRN 9FT ADLT (ELECTROSURGICAL) ×1 IMPLANT
FORCEPS BPLR LNG DVNC XI (INSTRUMENTS) ×1 IMPLANT
FORCEPS LONG TIP 8 DVNC XI (FORCEP) ×1 IMPLANT
FORCEPS PROGRASP DVNC XI (FORCEP) ×1 IMPLANT
GAUZE 4X4 16PLY ~~LOC~~+RFID DBL (SPONGE) IMPLANT
GAUZE SPONGE 4X4 12PLY STRL (GAUZE/BANDAGES/DRESSINGS) IMPLANT
GLOVE BIO SURGEON STRL SZ 6.5 (GLOVE) ×3 IMPLANT
GLOVE BIOGEL PI IND STRL 7.0 (GLOVE) ×5 IMPLANT
HEMOSTAT ARISTA ABSORB 3G PWDR (HEMOSTASIS) IMPLANT
HIBICLENS CHG 4% 4OZ BTL (MISCELLANEOUS) ×2 IMPLANT
IRRIGATION STRYKERFLOW (MISCELLANEOUS) ×1 IMPLANT
IRRIGATOR STRYKERFLOW (MISCELLANEOUS) ×1 IMPLANT
IV NS 1000ML BAXH (IV SOLUTION) IMPLANT
KIT PINK PAD W/HEAD ARE REST (MISCELLANEOUS) ×1 IMPLANT
KIT PINK PAD W/HEAD ARM REST (MISCELLANEOUS) ×1 IMPLANT
LEGGING LITHOTOMY PAIR STRL (DRAPES) ×1 IMPLANT
NS IRRIG 1000ML POUR BTL (IV SOLUTION) IMPLANT
OBTURATOR OPTICAL STND 8 DVNC (TROCAR) ×1 IMPLANT
OBTURATOR OPTICALSTD 8 DVNC (TROCAR) ×1 IMPLANT
OCCLUDER COLPOPNEUMO (BALLOONS) ×1 IMPLANT
PACK ROBOT WH (CUSTOM PROCEDURE TRAY) ×1 IMPLANT
PACK ROBOTIC GOWN (GOWN DISPOSABLE) ×1 IMPLANT
PAD OB MATERNITY 11 LF (PERSONAL CARE ITEMS) ×1 IMPLANT
POWDER SURGICEL 3.0 GRAM (HEMOSTASIS) IMPLANT
RETRACTOR WND ALEXIS 18 MED (MISCELLANEOUS) IMPLANT
RTRCTR WOUND ALEXIS 18CM MED (MISCELLANEOUS) IMPLANT
RTRCTR WOUND ALEXIS 18CM SML (INSTRUMENTS) IMPLANT
SAVER CELL AAL HAEMONETICS (INSTRUMENTS) IMPLANT
SCISSORS LAP 5X35 DISP (ENDOMECHANICALS) IMPLANT
SCISSORS MNPLR CVD DVNC XI (INSTRUMENTS) ×1 IMPLANT
SEAL UNIV 5-12 XI (MISCELLANEOUS) ×4 IMPLANT
SEALER VESSEL EXT DVNC XI (MISCELLANEOUS) ×1 IMPLANT
SET IRRIG Y TYPE TUR BLADDER L (SET/KITS/TRAYS/PACK) IMPLANT
SET TUBE FILTERED XL AIRSEAL (SET/KITS/TRAYS/PACK) ×1 IMPLANT
SPIKE FLUID TRANSFER (MISCELLANEOUS) ×4 IMPLANT
SUT VIC AB 0 CT1 27XBRD ANBCTR (SUTURE) ×2 IMPLANT
SUT VICRYL 0 UR6 27IN ABS (SUTURE) IMPLANT
SUT VICRYL RAPIDE 4/0 PS 2 (SUTURE) ×2 IMPLANT
SUT VLOC 180 0 9IN GS21 (SUTURE) ×1 IMPLANT
TAPE PAPER 3X10 WHT MICROPORE (GAUZE/BANDAGES/DRESSINGS) IMPLANT
TIP ENDOSCOPIC SURGICEL (TIP) IMPLANT
TIP RUMI ORANGE 6.7MMX12CM (TIP) IMPLANT
TIP UTERINE 5.1X6CM LAV DISP (MISCELLANEOUS) IMPLANT
TIP UTERINE 6.7X10CM GRN DISP (MISCELLANEOUS) IMPLANT
TIP UTERINE 6.7X6CM WHT DISP (MISCELLANEOUS) IMPLANT
TIP UTERINE 6.7X8CM BLUE DISP (MISCELLANEOUS) IMPLANT
TOWEL GREEN STERILE (TOWEL DISPOSABLE) ×1 IMPLANT
TRAY FOLEY MTR SLVR 14FR STAT (SET/KITS/TRAYS/PACK) ×1 IMPLANT
UNDERPAD 30X36 HEAVY ABSORB (UNDERPADS AND DIAPERS) ×1 IMPLANT

## 2023-10-02 NOTE — Op Note (Addendum)
 Pre Op Dx:   1. Abnormal uterine bleeding 2. Iron deficiency anemia due to chronic blood loss  Post Op Dx:   Same as pre-operative diagnoses  Procedure:   Robotic Assisted Total Laparoscopic Hysterectomy and Bilateral Salpingectomy    Surgeon:  Dr. [name] Assistants:  Webb Silversmith, RNFA Anesthesia:  General   EBL:  20cc  IVF:  800cc UOP:  315cc   Drains:  Foley catheter Specimen removed:  Uterus, cervix, and bilateral fallopian tubes - sent to pathology Device(s) implanted: None Case Type:  Clean-contaminated Findings:  Normal-appearing multiparous cervix, normal-appearing uterus and bilateral fallopian tubes, and ovaries. Evidence of prior bilateral tubal ligation. Normal-appearing liver contours and appendix. Bilateral ureters visualized with peristalsis pre and post hysterectomy and after closure of vaginal cuff. Complications: None Indications:  43 y.o. G2P2 s/p BTL with AUB who desires definitive surgical management.  Description of each procedure:  After informed consent the patient was taken to the operating room and placed in dorsal supine position where general endotracheal anesthesia was administered and found to be adequate.  She was placed in dorsal lithotomy position with her arms tucked.  She was prepped and draped in the usual sterile fashion.  A timeout was called and the procedure confirmed.  A RUMI uterine manipulator with the Koh cup and a Foley catheter were placed.   A 8mm intraumbilical incision was made and a trochar was used to enter the abdomen under direct visualization.  Pneumoperitoneum was established and atraumatic entry confirmed. Two additional 8mm ports were placed on either side of the umbilicus and an 8mm port was placed in the left upper quadrant under direct visualization. All port sites were injected with 10cc local anesthetic. The pelvis was bathed in a 60cc Ropivicaine solution.  The patient was placed in Trendelenburg position and  the [employer] robotic device was docked.  Next, attention was turned to the console where the hysterectomy was performed.  The right fallopian tube was divided at the mesosalpinx and the uteroovarian anastamosis was divided and the right round ligament was divided.   This process was repeated on the contralateral side.  The anterior leaflet of the broad ligament was divided to create a bladder flap. The uterine artery and vein were skeletonized and desiccated superior to the Koh cup.  This process was repeated on the contralateral side.  Uterine blanching was observed.  A circumferential colpotomy was created along the ridge of the Koh cup and the uterus was passed off the field.  The vaginal occluder was placed in the vagina to maintain pneumoperitoneum.  The vaginal cuff was then closed with V-loc suture. Hemostasis confirmed. Arista powder was placed on the vaginal cuff and adnexa bilaterally.  The [employer] Vinc[employer] robotic device was undocked and all ports were visualized.   The pneumoperitoneum was reduced completely with the assistance of two deep breaths and all ports were removed.  The skin was closed with 4-0 Vicryl in subcuticular fashion with skin glue placed atop each port site.   The vagina was inspected and there were no vaginal tears noted and no foreign objects remaining in the vagina. Vaginal cuff palpated intact. The patient was returned to dorsal supine position, awakened and extubated in the OR having appeared to tolerate the procedure well.  All sponge, needle, and instrument counts were correct x 2 at the end of the case.  Disposition:  PACU  [name], DO

## 2023-10-02 NOTE — Discharge Summary (Signed)
 Physician Discharge Summary  Patient ID: Martha [name] MRN: [id number] DOB/AGE: [birth year] 43 y.o.  Admit date: 10/02/2023 Discharge date: 10/02/2023  Admission Diagnoses: Abnormal uterine bleeding  Discharge Diagnoses:  Principal Problem:   Abnormal uterine bleeding (AUB)  Procedure(s): Robotic Assisted Total Laparoscopic Hysterectomy and Bilateral Salpingectomy   Discharged Condition: good  Hospital Course: Patient was admitted on 10/02/2023 for the above named procedure(s) for the above named diagnoses. Prior to hospital discharge, patient was tolerating PO, ambulating, voiding spontaneously, and pain was well-controlled. See hospital chart for specific details. Patient was discharged home in stable condition.  Consults:  None  Significant Diagnostic Studies: None  Treatments: surgery: As above  Discharge Exam: Blood pressure 118/80, pulse 83, temperature (!) 97.1 F (36.2 C), resp. rate 14, height 5\' 6"  (1.676 m), weight 89.4 kg, last menstrual period 09/25/2023, SpO2 100%. Gen:  NAD, pleasant and cooperative Cardio:  Regular rate Pulm:  Normal work of breathing Abd:  Soft, non-distended, non-tender throughout, no rebound/guarding, laparoscopic port sites well c/d/I with skin glue atop, LLQ port site with 2x2 and skin tape Ext:  No bilateral LE edema, no bilateral calf tenderness, SCDs on and working  Disposition: Discharge disposition: 01-Home or Self Care       Discharge Instructions     Call MD for:   Complete by: As directed    Spotting to very light bleeding is normal as the vaginal cuff is healing. Call with any heavy vaginal bleeding (filling up more than one large pad in an hour).   Call MD for:  difficulty breathing, headache or visual disturbances   Complete by: As directed    Call MD for:  extreme fatigue   Complete by: As directed    Call MD for:  hives   Complete by: As directed    Call MD for:  persistant dizziness or light-headedness   Complete by: As  directed    Call MD for:  persistant nausea and vomiting   Complete by: As directed    Call MD for:  redness, tenderness, or signs of infection (pain, swelling, redness, odor or green/yellow discharge around incision site)   Complete by: As directed    Call MD for:  severe uncontrolled pain   Complete by: As directed    Call MD for:  temperature >100.4   Complete by: As directed    Diet - low sodium heart healthy   Complete by: As directed    Driving Restrictions   Complete by: As directed    No driving for at least 2 weeks.   Increase activity slowly   Complete by: As directed    Lifting restrictions   Complete by: As directed    No lifting greater than 10lbs for 6 weeks.   Other Restrictions   Complete by: As directed    No baths or submersion in water for at least 6 weeks.   Sexual Activity Restrictions   Complete by: As directed    No sexual intercourse or objects in the vagina for at least 6 weeks.      Allergies as of 10/02/2023       Reactions   Penicillins Diarrhea, Nausea And Vomiting   Has patient had a PCN reaction causing immediate rash, facial/tongue/throat swelling, SOB or lightheadedness with hypotension: No Has patient had a PCN reaction causing severe rash involving mucus membranes or skin necrosis: No Has patient had a PCN reaction that required hospitalization No Has patient had a PCN reaction occurring within  the last 10 years: Yes If all of the above answers are "NO", then may proceed with Cephalosporin use.        Medication List     TAKE these medications    acetaminophen 500 MG tablet Commonly known as: TYLENOL Take 500-1,000 mg by mouth every 6 (six) hours as needed (pain.).   albuterol (2.5 MG/3ML) 0.083% nebulizer solution Commonly known as: PROVENTIL Take 3 mLs (2.5 mg total) by nebulization every 6 (six) hours as needed for wheezing or shortness of breath.   albuterol 108 (90 Base) MCG/ACT inhaler Commonly known as: VENTOLIN  HFA Inhale 1-2 puffs into the lungs every 6 (six) hours as needed for wheezing or shortness of breath.   B-12 PO Take 5,000 mcg by mouth daily. Liquid   budesonide-formoterol 80-4.5 MCG/ACT inhaler Commonly known as: Symbicort Inhale 2 puffs into the lungs in the morning and at bedtime.   ibuprofen 800 MG tablet Commonly known as: ADVIL Take 1 tablet (800 mg total) by mouth every 8 (eight) hours as needed.   multivitamin tablet Take 1 tablet by mouth daily.   Nebulizer Mask Adult Misc 1 each by Does not apply route as needed.   oxyCODONE 5 MG immediate release tablet Commonly known as: Oxy IR/ROXICODONE Take 1 tablet (5 mg total) by mouth every 6 (six) hours as needed for severe pain (pain score 7-10) or breakthrough pain.   pantoprazole 40 MG tablet Commonly known as: PROTONIX Take 1 tablet (40 mg total) by mouth daily as needed.   phentermine 37.5 MG capsule Take 1 capsule (37.5 mg total) by mouth every morning. What changed: when to take this        Follow-up Information     [name], DO Follow up in 2 week(s).   Specialty: Obstetrics and Gynecology Why: Please keep your 2 week and 6 week post-operative visits as previously scheduled. Contact information: [address] [place] [address] [phone]                 Signed: [name] 10/02/2023, 6:29 PM

## 2023-10-02 NOTE — Anesthesia Preprocedure Evaluation (Addendum)
 Anesthesia Evaluation  Patient identified by MRN, date of birth, ID band Patient awake    Reviewed: Allergy & Precautions, NPO status , Patient's Chart, lab work & pertinent test results  Airway Mallampati: I  TM Distance: >3 FB Neck ROM: Full    Dental no notable dental hx. (+) Teeth Intact, Dental Advisory Given   Pulmonary asthma    Pulmonary exam normal breath sounds clear to auscultation       Cardiovascular negative cardio ROS Normal cardiovascular exam Rhythm:Regular Rate:Normal     Neuro/Psych  Headaches  negative psych ROS   GI/Hepatic Neg liver ROS,GERD  Medicated,,  Endo/Other  negative endocrine ROS    Renal/GU negative Renal ROS  negative genitourinary   Musculoskeletal  (+) Arthritis , Osteoarthritis,    Abdominal  (+) + obese  Peds  Hematology  (+) Blood dyscrasia, anemia   Anesthesia Other Findings   Reproductive/Obstetrics negative OB ROS                             Lab Results  Component Value Date   WBC 8.1 10/01/2023   HGB 11.6 (L) 10/01/2023   HCT 38.0 10/01/2023   MCV 85.4 10/01/2023   PLT 260 10/01/2023   No results found for: "INR", "PROTIME"   Anesthesia Physical Anesthesia Plan  ASA: 2  Anesthesia Plan: General   Post-op Pain Management: Minimal or no pain anticipated   Induction: Intravenous  PONV Risk Score and Plan: 3 and Ondansetron, Dexamethasone, Midazolam and Treatment may vary due to age or medical condition  Airway Management Planned: Oral ETT  Additional Equipment: None  Intra-op Plan:   Post-operative Plan: Extubation in OR  Informed Consent: I have reviewed the patients History and Physical, chart, labs and discussed the procedure including the risks, benefits and alternatives for the proposed anesthesia with the patient or authorized representative who has indicated his/her understanding and acceptance.       Plan Discussed  with: CRNA and Anesthesiologist  Anesthesia Plan Comments:         Anesthesia Quick Evaluation

## 2023-10-02 NOTE — Progress Notes (Addendum)
 Late Entry  I was called out of surgery at approximately 1400 to evaluate patient at bedside in the PACU. She had significant umbilical pain which she reported as sharp. She received Fentanyl and Dilaudid 1mg . Cleared to receive another 1mg  of Dilaudid by anesthesia.   On exam, patient was tearful and wincing. Abdomen had normoactive bowel sounds, was soft, non-distended, with tenderness surrounding the umbilicus, no rebound/guarding, incisions with skin glue atop and LLQ port site dressed with a 2x2 and skin tape. Vital signs were normal. No suspicion for intraabdominal bleeding and suspect gas pain vs muscle spasm. Additional Dilaudid 1mg  was given and Flexeril 10mg  to be given 10 minutes later.   Patient evaluated now [phone]) at bedside and is feeling much better. She did endorse that her umbilicus was hurting last night but not this morning. She has ambulated, voided, tolerated PO. Pain is now well-controlled. Abdomen is soft, non-distended, non-tender throughout. Incisions are clean/dry/intact with skin glue atop and LLQ port site with 2x2 and skin tape. Vital signs reviewed and have been unremarkable. Patient desires discharge home this evening. Husband confirmed medications are ready for pick-up at their pharmacy.  [name], DO

## 2023-10-02 NOTE — Interval H&P Note (Signed)
 History and Physical Interval Note:  10/02/2023 9:29 AM  Martha [name]  has presented today for surgery, with the diagnosis of Iron deficiency anemia due to chronic blood loss Abnormal uterine bleeding.  The various methods of treatment have been discussed with the patient and family. After consideration of risks, benefits and other options for treatment, the patient has consented to  Procedure(s): XI ROBOTIC ASSISTED LAPAROSCOPIC HYSTERECTOMY AND BILATERAL SALPINGECTOMY (Bilateral) as a surgical intervention.  The patient's history has been reviewed, patient examined, no change in status, stable for surgery.  I have reviewed the patient's chart and labs.  Questions were answered to the patient's satisfaction.     [name]

## 2023-10-02 NOTE — Anesthesia Postprocedure Evaluation (Signed)
 Anesthesia Post Note  Patient: Martha [name]  Procedure(s) Performed: XI ROBOTIC ASSISTED LAPAROSCOPIC HYSTERECTOMY AND BILATERAL SALPINGECTOMY (Bilateral: Abdomen)     Patient location during evaluation: PACU Anesthesia Type: General Level of consciousness: awake and alert Pain management: pain level controlled Vital Signs Assessment: post-procedure vital signs reviewed and stable Respiratory status: spontaneous breathing, nonlabored ventilation, respiratory function stable and patient connected to nasal cannula oxygen Cardiovascular status: blood pressure returned to baseline and stable Postop Assessment: no apparent nausea or vomiting Anesthetic complications: no   No notable events documented.  Last Vitals:  Vitals:   10/02/23 1223 10/02/23 1300  BP: 119/79 126/79  Pulse:  74  Resp:  11  Temp: (!) 35.8 C   SpO2:  99%    Last Pain:  Vitals:   10/02/23 1300  TempSrc:   PainSc: 6                  Rodgerick Gilliand

## 2023-10-02 NOTE — Transfer of Care (Signed)
 Immediate Anesthesia Transfer of Care Note  Patient: Martha [name]  Procedure(s) Performed: XI ROBOTIC ASSISTED LAPAROSCOPIC HYSTERECTOMY AND BILATERAL SALPINGECTOMY (Bilateral: Abdomen)  Patient Location: PACU  Anesthesia Type:General  Level of Consciousness: awake and sedated  Airway & Oxygen Therapy: Patient Spontanous Breathing and Patient connected to face mask oxygen  Post-op Assessment: Report given to RN and Post -op Vital signs reviewed and stable  Post vital signs: Reviewed and stable  Last Vitals:  Vitals Value Taken Time  BP 120/76 10/02/23 1230  Temp 35.8 C 10/02/23 1223  Pulse 77 10/02/23 1230  Resp 17 10/02/23 1230  SpO2 100 % 10/02/23 1230  Vitals shown include unfiled device data.  Last Pain:  Vitals:   10/02/23 1223  TempSrc:   PainSc: Asleep      Patients Stated Pain Goal: 5 (10/02/23 1223)  Complications: No notable events documented.

## 2023-10-02 NOTE — Anesthesia Procedure Notes (Signed)
 Procedure Name: Intubation Date/Time: 10/02/2023 10:19 AM  Performed by: Nesta Scaturro C, CRNAPre-anesthesia Checklist: Patient identified, Emergency Drugs available, Suction available and Patient being monitored Patient Re-evaluated:Patient Re-evaluated prior to induction Oxygen Delivery Method: Circle system utilized Preoxygenation: Pre-oxygenation with 100% oxygen Induction Type: IV induction Ventilation: Mask ventilation without difficulty Laryngoscope Size: Mac and 3 Grade View: Grade II Tube type: Oral Tube size: 7.0 mm Number of attempts: 1 Airway Equipment and Method: Stylet and Oral airway Placement Confirmation: ETT inserted through vocal cords under direct vision, positive ETCO2 and breath sounds checked- equal and bilateral Secured at: 22 cm Tube secured with: Tape Dental Injury: Teeth and Oropharynx as per pre-operative assessment

## 2023-10-03 ENCOUNTER — Encounter (HOSPITAL_COMMUNITY): Payer: Self-pay | Admitting: Obstetrics and Gynecology

## 2023-10-03 LAB — SURGICAL PATHOLOGY

## 2023-11-03 ENCOUNTER — Encounter: Payer: Self-pay | Admitting: Family Medicine

## 2023-11-08 ENCOUNTER — Ambulatory Visit: Admitting: Family Medicine

## 2023-11-08 ENCOUNTER — Encounter: Payer: Self-pay | Admitting: Family Medicine

## 2023-11-08 VITALS — BP 126/77 | HR 91 | Ht 66.0 in | Wt 201.0 lb

## 2023-11-08 DIAGNOSIS — L509 Urticaria, unspecified: Secondary | ICD-10-CM | POA: Diagnosis not present

## 2023-11-08 DIAGNOSIS — L299 Pruritus, unspecified: Secondary | ICD-10-CM

## 2023-11-08 MED ORDER — HYDROXYZINE PAMOATE 25 MG PO CAPS: 25.0000 mg | ORAL_CAPSULE | Freq: Every evening | ORAL | 3 refills | Status: AC

## 2023-11-08 MED ORDER — HYDROCORTISONE 1 % EX CREA: 1.0000 | TOPICAL_CREAM | Freq: Two times a day (BID) | CUTANEOUS | 0 refills | Status: AC

## 2023-11-08 NOTE — Patient Instructions (Signed)

## 2023-11-08 NOTE — Progress Notes (Signed)
 Established Patient Office Visit   Subjective  Patient ID: Martha [name], female    DOB: [birth year]  Age: 43 y.o. MRN: [id number]  Chief Complaint  Patient presents with   Medical Management of Chronic Issues    Itching all over.  Since Hysterectomy 04/09 has been experiencing hives when she bathes, when in direct sun light, when coming in contact with certain cleaning products like palmolive( which is causing pain) . She is having random breakouts  She has also developed a shellfish allergy since surgery as well.     She  has a past medical history of Abnormal uterine bleeding (AUB), GERD (gastroesophageal reflux disease), Hiatal hernia, History of cervical dysplasia (2005), Hyperlipidemia, mixed, Iron  deficiency anemia due to chronic blood loss, Mild persistent asthma, Pre-diabetes, Spinal arachnoid cyst (2020), Vitamin D  deficiency, and Wears glasses.  History of Present Illness (HPI): The patient presents with a one-month history of intermittent itching and hives. Symptoms began suddenly and occur intermittently, with each episode lasting approximately 10 minutes. The itching is primarily localized to the back, arms, and legs, though the patient reports a generalized sensation of itching. She rates the severity as 10 out of 10 and notes occasional disruption to sleep and daily activities. Associated symptoms include redness, raised bumps, and a recent episode of mouth swelling following shellfish ingestion. Although the patient has no known history of seafood or shellfish allergy, she now appears to react to it. There is no history of fever, night sweats, weight loss, fatigue, jaundice, dark urine, or pale stools. Symptoms are aggravated by showering, dish soap, and sun exposure. The patient reports no changes in personal care products or household detergents, which she has used consistently throughout her life. Interestingly, she now seems to be developing sensitivities to these longstanding  products. Benadryl  has provided relief during the allergic episode involving shellfish, but over-the-counter hydrocortisone cream has not been effective for the skin symptoms. She denies use of any new medications, supplements, or substances. There is no history of eczema, psoriasis, or other dermatologic conditions. The patient also reports no recent travel, environmental changes, new pets, or known exposures to allergens or irritants.        Review of Systems  Constitutional:  Negative for chills and fever.  Respiratory:  Negative for shortness of breath.   Cardiovascular:  Negative for chest pain.  Skin:  Positive for itching and rash.  Neurological:  Positive for dizziness.      Objective:     BP 126/77   Pulse 91   Ht 5\' 6"  (1.676 m)   Wt 201 lb 0.6 oz (91.2 kg)   LMP 09/25/2023 (Exact Date)   SpO2 98%   BMI 32.45 kg/m  BP Readings from Last 3 Encounters:  11/08/23 126/77  10/02/23 111/78  08/23/23 118/81      Physical Exam Vitals reviewed.  Constitutional:      General: She is not in acute distress.    Appearance: Normal appearance. She is not ill-appearing, toxic-appearing or diaphoretic.  HENT:     Head: Normocephalic.  Eyes:     General:        Right eye: No discharge.        Left eye: No discharge.     Conjunctiva/sclera: Conjunctivae normal.  Cardiovascular:     Rate and Rhythm: Normal rate.     Pulses: Normal pulses.     Heart sounds: Normal heart sounds.  Pulmonary:     Effort: Pulmonary effort is normal. No  respiratory distress.     Breath sounds: Normal breath sounds.  Skin:    General: Skin is warm and dry.     Findings: No erythema, lesion or rash.  Neurological:     Mental Status: She is alert.  Psychiatric:        Mood and Affect: Mood normal.        Behavior: Behavior normal.      No results found for any visits on 11/08/23.  The 10-year ASCVD risk score (Arnett DK, et al., 2019) is: 0.5%    Assessment & Plan:   Pruritus Assessment & Plan: Start Hydroxyzine  25 mg at bedtime as needed to help manage itching and improve sleep. Apply Hydrocortisone 1% cream to affected areas as needed for inflammation and irritation. Referral placed for allergy testing to assess potential food or environmental allergens. Patient education provided, including: Avoid suspected triggers such as shellfish, dish soap, and excessive sun exposure. Use fragrance-free moisturizers regularly to help maintain skin barrier and reduce irritation.  Orders: -     hydrOXYzine  Pamoate; Take 1 capsule (25 mg total) by mouth at bedtime.  Dispense: 30 capsule; Refill: 3 -     Hydrocortisone; Apply 1 Application topically 2 (two) times daily.  Dispense: 30 g; Refill: 0  Hives -     ANA w/Reflex -     Ambulatory referral to Allergy -     hydrOXYzine  Pamoate; Take 1 capsule (25 mg total) by mouth at bedtime.  Dispense: 30 capsule; Refill: 3    Return if symptoms worsen or fail to improve.   Martha Lek Amber Bail, FNP

## 2023-11-08 NOTE — Assessment & Plan Note (Signed)
 Start Hydroxyzine  25 mg at bedtime as needed to help manage itching and improve sleep. Apply Hydrocortisone 1% cream to affected areas as needed for inflammation and irritation. Referral placed for allergy testing to assess potential food or environmental allergens. Patient education provided, including: Avoid suspected triggers such as shellfish, dish soap, and excessive sun exposure. Use fragrance-free moisturizers regularly to help maintain skin barrier and reduce irritation.

## 2023-11-10 LAB — ANA W/REFLEX: Anti Nuclear Antibody (ANA): NEGATIVE

## 2023-11-21 ENCOUNTER — Encounter: Payer: Self-pay | Admitting: Family Medicine

## 2023-11-21 ENCOUNTER — Ambulatory Visit (INDEPENDENT_AMBULATORY_CARE_PROVIDER_SITE_OTHER): Admitting: Family Medicine

## 2023-11-21 VITALS — BP 106/75 | HR 85 | Ht 66.0 in | Wt 200.2 lb

## 2023-11-21 DIAGNOSIS — T7840XA Allergy, unspecified, initial encounter: Secondary | ICD-10-CM | POA: Insufficient documentation

## 2023-11-21 DIAGNOSIS — T7840XD Allergy, unspecified, subsequent encounter: Secondary | ICD-10-CM

## 2023-11-21 MED ORDER — EPINEPHRINE 0.3 MG/0.3ML IJ SOAJ: 0.3000 mg | INTRAMUSCULAR | 1 refills | Status: AC | PRN

## 2023-11-21 MED ORDER — PREDNISONE 20 MG PO TABS: 20.0000 mg | ORAL_TABLET | Freq: Two times a day (BID) | ORAL | 0 refills | Status: AC

## 2023-11-21 MED ORDER — MECLIZINE HCL 12.5 MG PO TABS: 12.5000 mg | ORAL_TABLET | Freq: Three times a day (TID) | ORAL | 2 refills | Status: AC | PRN

## 2023-11-21 NOTE — Progress Notes (Signed)
 Established Patient Office Visit   Subjective  Patient ID: Martha [name], female    DOB: September 30, 1980  Age: 43 y.o. MRN: [id number]  Chief Complaint  Patient presents with   Weight Loss    She  has a past medical history of Abnormal uterine bleeding (AUB), GERD (gastroesophageal reflux disease), Hiatal hernia, History of cervical dysplasia (2005), Hyperlipidemia, mixed, Iron  deficiency anemia due to chronic blood loss, Mild persistent asthma, Pre-diabetes, Spinal arachnoid cyst (2020), Vitamin D  deficiency, and Wears glasses.  Allergic Reaction: The patient presents with a recurrent allergic reaction characterized by persistent, waxing and waning symptoms. The current episode appears to be triggered by exposure to potential allergens such as food, detergent, soap, or water , though symptom onset did not immediately follow exposure. Associated symptoms include itching, hives, with no swelling, difficulty breathing, or eye redness reported. The patient has previously tried diphenhydramine , which provided no relief. Past medical history is significant for atopic dermatitis and food allergies, increasing the likelihood of hypersensitivity reactions.    Review of Systems  Constitutional:  Negative for chills and fever.  Eyes:  Negative for redness.  Respiratory:  Negative for shortness of breath.   Cardiovascular:  Negative for chest pain.  Skin:  Positive for itching.       hives  Neurological:  Positive for dizziness and headaches.      Objective:     BP 106/75   Pulse 85   Ht 5\' 6"  (1.676 m)   Wt 200 lb 4 oz (90.8 kg)   LMP 09/25/2023 (Exact Date)   SpO2 96%   BMI 32.32 kg/m  BP Readings from Last 3 Encounters:  11/21/23 106/75  11/08/23 126/77  10/02/23 111/78      Physical Exam Vitals reviewed.  Constitutional:      General: She is not in acute distress.    Appearance: Normal appearance. She is not ill-appearing, toxic-appearing or diaphoretic.  HENT:     Head:  Normocephalic.  Eyes:     General:        Right eye: No discharge.        Left eye: No discharge.     Conjunctiva/sclera: Conjunctivae normal.  Cardiovascular:     Rate and Rhythm: Normal rate.     Pulses: Normal pulses.     Heart sounds: Normal heart sounds.  Pulmonary:     Effort: Pulmonary effort is normal. No respiratory distress.     Breath sounds: Normal breath sounds.  Musculoskeletal:        General: Normal range of motion.     Cervical back: Normal range of motion.  Skin:    General: Skin is warm and dry.     Capillary Refill: Capillary refill takes less than 2 seconds.  Neurological:     Mental Status: She is alert.     Coordination: Coordination normal.     Gait: Gait normal.  Psychiatric:        Mood and Affect: Mood normal.        Behavior: Behavior normal.      No results found for any visits on 11/21/23.  The 10-year ASCVD risk score (Arnett DK, et al., 2019) is: 0.2%    Assessment & Plan:  Allergy, subsequent encounter Assessment & Plan: Unknown etigoy Started Prednisone  20 mg twice daily x 10 days EPINEPHrine  injection ordered PRN Patient has a pending follow up with Allergy center  Orders: -     EPINEPHrine ; Inject 0.3 mg into the muscle as needed  for anaphylaxis.  Dispense: 1 each; Refill: 1  Other orders -     predniSONE ; Take 1 tablet (20 mg total) by mouth 2 (two) times daily with a meal for 10 days.  Dispense: 20 tablet; Refill: 0 -     Meclizine  HCl; Take 1 tablet (12.5 mg total) by mouth 3 (three) times daily as needed for dizziness.  Dispense: 30 tablet; Refill: 2    Return if symptoms worsen or fail to improve.   Avelino Lek Amber Bail, FNP

## 2023-11-21 NOTE — Patient Instructions (Signed)

## 2023-11-21 NOTE — Assessment & Plan Note (Addendum)
 Unknown etigoy Started Prednisone  20 mg twice daily x 10 days EPINEPHrine  injection ordered PRN Patient has a pending follow up with Allergy center

## 2023-12-06 ENCOUNTER — Other Ambulatory Visit: Payer: Self-pay

## 2023-12-06 DIAGNOSIS — D509 Iron deficiency anemia, unspecified: Secondary | ICD-10-CM

## 2023-12-09 ENCOUNTER — Inpatient Hospital Stay: Attending: Physician Assistant

## 2023-12-09 DIAGNOSIS — N920 Excessive and frequent menstruation with regular cycle: Secondary | ICD-10-CM | POA: Insufficient documentation

## 2023-12-09 DIAGNOSIS — D509 Iron deficiency anemia, unspecified: Secondary | ICD-10-CM | POA: Diagnosis present

## 2023-12-09 LAB — CBC WITH DIFFERENTIAL/PLATELET
Abs Immature Granulocytes: 0 10*3/uL (ref 0.00–0.07)
Basophils Absolute: 0.1 10*3/uL (ref 0.0–0.1)
Basophils Relative: 1 %
Eosinophils Absolute: 0.1 10*3/uL (ref 0.0–0.5)
Eosinophils Relative: 1 %
HCT: 37.4 % (ref 36.0–46.0)
Hemoglobin: 11.9 g/dL — ABNORMAL LOW (ref 12.0–15.0)
Lymphocytes Relative: 33 %
Lymphs Abs: 3.5 10*3/uL (ref 0.7–4.0)
MCH: 27.3 pg (ref 26.0–34.0)
MCHC: 31.8 g/dL (ref 30.0–36.0)
MCV: 85.8 fL (ref 80.0–100.0)
Monocytes Absolute: 0.9 10*3/uL (ref 0.1–1.0)
Monocytes Relative: 8 %
Neutro Abs: 6.1 10*3/uL (ref 1.7–7.7)
Neutrophils Relative %: 57 %
Platelets: 231 10*3/uL (ref 150–400)
RBC: 4.36 MIL/uL (ref 3.87–5.11)
RDW: 13.2 % (ref 11.5–15.5)
WBC: 10.7 10*3/uL — ABNORMAL HIGH (ref 4.0–10.5)
nRBC: 0 % (ref 0.0–0.2)

## 2023-12-09 LAB — IRON AND TIBC
Iron: 50 ug/dL (ref 28–170)
Saturation Ratios: 15 % (ref 10.4–31.8)
TIBC: 327 ug/dL (ref 250–450)
UIBC: 277 ug/dL

## 2023-12-09 LAB — FERRITIN: Ferritin: 133 ng/mL (ref 11–307)

## 2023-12-10 ENCOUNTER — Other Ambulatory Visit (HOSPITAL_COMMUNITY): Payer: Self-pay | Admitting: Family Medicine

## 2023-12-10 DIAGNOSIS — Z1231 Encounter for screening mammogram for malignant neoplasm of breast: Secondary | ICD-10-CM

## 2023-12-14 NOTE — Progress Notes (Unsigned)
 [facility] [address], [place] [address]   CLINIC:  Medical Oncology/Hematology  PCP:  Terry Wilhelmena Lloyd Hilario, FNP [phone] [place] [address] [phone]   REASON FOR VISIT:  Follow-up for iron  deficiency anemia   PRIOR THERAPY: Oral iron  tablets   CURRENT THERAPY: Intermittent Venofer   INTERVAL HISTORY:  Martha [name] is contacted today for follow-up of  iron  deficiency anemia.  She was last evaluated via telemedicine visit by Pleasant Barefoot PA-C on 06/05/2023.  In the interim since last visit, she had robotic assisted total laparoscopic hysterectomy and bilateral salpingectomy on 10/02/2023.  Indication for procedure was abnormal uterine bleeding.*** ***Prior hysterectomy, she was continuing to have heavy but irregular menstrual cycles. ***No recurrent hematemesis since isolated episode in April 2023. ***No melena, hematochezia, or epistaxis. ***She continues to take liquid iron  for the past 6 months, tolerating this well.  ***Energy ***Lightheadedness, increased headaches, and leg cramps. ***DENIES any chest pain, dyspnea on exertion, pica, or syncopal episodes.     She has 50***% energy and 100***% appetite. She endorses that she is maintaining a stable weight.  ASSESSMENT & PLAN:  1.  Iron  deficiency anemia: - Most likely etiology is normal blood loss from menses as well as malabsorption in the setting of chronic PPI use - Initial labs on 06/10/2020 with hemoglobin 11 and ferritin of 25. - Additional work-up from 07/21/2020 showed normal B12, methylmalonic acid, copper  and folic acid  levels.  SPEP was also negative. - She took oral iron  for over 1 year without improvement in her levels - No history of transfusion. - EGD on 03/30/2020 with mild erosive reflux esophagitis, medium sized hiatal hernia.  Normal duodenum. - Colonoscopy on 03/30/2020 was normal.  Normal distal ileum. - Hysterectomy on 10/02/2023 due to abnormal  uterine bleeding. - No rectal bleeding or melena*** - Most recent IV iron  with Venofer  400 mg x 1 on 06/17/2023 - Most recent labs (12/09/2023): Hgb 11.9/MCV 85.8, ferritin 133, iron  saturation 15%  - Most recent IV Venofer  (300 mg x 3) from 06/22/2022 through 07/06/2022 - She reported improvement in her energy levels after IV iron  infusions - Currently reports increased fatigue, headaches, dizziness - She has heavy menses, following with GYN.  Single episode of hematemesis in April 2023, no recurrence.  No bleeding per rectum reported.     - Most recent labs (05/29/2023): Hgb 11.7/MCV 86.7, ferritin 110, iron  saturation 10%. - PLAN: No current indication for IV iron .  Since primary source of bleeding (abnormal uterine bleeding) has been definitively treated (hysterectomy), she is likely stable for discharge to PCP.  *** Or recheck in 1 year and then discharge?  *** - Continue daily liquid iron .***   2.  GERD: - This is likely from hiatal hernia.  She is taking Protonix  twice weekly.   3.  Healthy lifestyle discussion - We discussed importance of cutting back on sugary treats and drinking more water    4.  Social/family history: - She works as a Pension scheme manager. - Maternal first cousin had to have blood transfusions.  Maternal aunt had nasopharyngeal cancer.   PLAN SUMMARY:*** >> *** >> *** >> ***   ** Last office visit on ***     REVIEW OF SYSTEMS: ***  Review of Systems - Oncology   PHYSICAL EXAM:  ECOG PERFORMANCE STATUS: {CHL ONC ECOG ED:[id number]} *** There were no vitals filed for this visit. There were no vitals filed for this visit. 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   Hyperlipidemia, mixed    Iron  deficiency anemia due to chronic blood  loss    hematology/ oncology--- pleasant Siomara Burkel PA,  treated w/ iv iron ,  last infusion 06-17-2023   Mild persistent asthma    Pre-diabetes    Spinal arachnoid cyst 2020   mild intradural spinal cord flattening @ T2-3 intradural  (per pt followed by neuro surgeon, asymptomatic)   Vitamin D  deficiency    Wears glasses    Past Surgical History:  Procedure Laterality Date   ABDOMINAL HYSTERECTOMY  10/02/2023   Partial   BIOPSY  03/30/2020   Procedure: BIOPSY;  Surgeon: Shaaron Lamar HERO, MD;  Location: [facility];  Service: Endoscopy;;   COLONOSCOPY N/A 03/30/2020   normal colon, normal TI.    DIAGNOSTIC LAPAROSCOPY  09/09/2001   @[facility] ;  AND D&C HYSTEROSCOPY   DILATATION & CURETTAGE/HYSTEROSCOPY WITH MYOSURE N/A 01/31/2022   Procedure: DILATATION & CURETTAGE/HYSTEROSCOPY WITH MYOSURE;  Surgeon: [name], DO;  Location: [facility];  Service: Gynecology;  Laterality: N/A;   ESOPHAGOGASTRODUODENOSCOPY  01/2010   Dr. shaaron: small hiatal hernia   ESOPHAGOGASTRODUODENOSCOPY N/A 03/30/2020   mild erosive reflux esophagitis, medium-sized hiatal hernia s/p biopsy, normal duodenum. Schatzki's ring. Negative H.pylori.    LASER ABLATION OF THE CERVIX  12/14/2003   @[facility]   (CO2 laser vaporization of CIN)   ROBOTIC ASSISTED LAPAROSCOPIC HYSTERECTOMY AND SALPINGECTOMY Bilateral 10/02/2023   Procedure: XI ROBOTIC ASSISTED LAPAROSCOPIC HYSTERECTOMY AND BILATERAL SALPINGECTOMY;  Surgeon: [name], DO;  Location: [facility];  Service: Gynecology;  Laterality: Bilateral;   TUBAL LIGATION Bilateral 06/26/2015   Procedure: POST PARTUM TUBAL LIGATION;  Surgeon: [name], MD;  Location: [facility];  Service: Gynecology;  Laterality: Bilateral;   WRIST GANGLION EXCISION  07/22/2009   @[facility]     SOCIAL HISTORY:  Social History   Socioeconomic History   Marital status: Married    Spouse name: Hunter    Number of children: 2   Years of education: Not on file   Highest  education level: Bachelor's degree (e.g., BA, AB, BS)  Occupational History   Occupation: Runner, broadcasting/film/video    Comment: special education  Tobacco Use   Smoking status: Never   Smokeless tobacco: Never  Vaping Use   Vaping status: Never Used  Substance and Sexual Activity   Alcohol use: Yes    Comment: socially   Drug use: Never   Sexual activity: Yes    Birth control/protection: Surgical  Other Topics Concern   Not on file  Social History Narrative   Lives with Hunter-husband 11 years and children: Aspen 7 and Rhyland 5      Enjoys: cooking, teaching      Diet: eats all food groups- no pork   Caffeine: 1 cup of coffee, tea daily   Water : 4-5 daily         Wears seat belt   Does not use phone while driving   Psychologist, sport and exercise at home   [employer]             Social Drivers of Health   Financial Resource Strain: Medium Risk (08/19/2023)   Overall Financial Resource Strain (CARDIA)    Difficulty of Paying Living Expenses: Somewhat hard  Food Insecurity: No Food Insecurity (10/02/2023)   Hunger Vital Sign    Worried About Running Out of Food in the Last Year: Never true    Ran Out of Food in the Last Year: Never true  Transportation Needs: No Transportation Needs (10/02/2023)   PRAPARE - Administrator, Civil Service (Medical): No    Lack of Transportation (Non-Medical): No  Physical Activity: Insufficiently Active (08/19/2023)   Exercise Vital Sign    Days of Exercise per Week: 3 days    Minutes of Exercise per Session: 30 min  Stress: Stress Concern Present (08/19/2023)   [employer] of Occupational Health - Occupational Stress Questionnaire    Feeling of Stress : Rather much  Social Connections: Socially Integrated (08/19/2023)   Social Connection and Isolation Panel    Frequency of Communication with Friends and Family: More than three times a week    Frequency of Social Gatherings with Friends and Family: More than three times a week    Attends  Religious Services: More than 4 times per year    Active Member of [employer] or Organizations: Yes    Attends Engineer, structural: More than 4 times per year    Marital Status: Married  Catering manager Violence: Not At Risk (10/02/2023)   Humiliation, Afraid, Rape, and Kick questionnaire    Fear of Current or Ex-Partner: No    Emotionally Abused: No    Physically Abused: No    Sexually Abused: No    FAMILY HISTORY:  Family History  Problem Relation Age of Onset   Asthma Mother    Allergies Mother    Diabetes Mother        prediabetes   Myasthenia gravis Father    Bladder Cancer Father        diagnosed at 54   Hyperlipidemia Father    Migraines Sister    Cancer Maternal Aunt        facial   Diabetes Paternal Uncle    Cancer Maternal Grandmother        stomach   Heart disease Paternal Grandmother    Heart disease Paternal Grandfather    Colon cancer Neg Hx    Inflammatory bowel disease Neg Hx    Celiac disease Neg Hx    Breast cancer Neg Hx    Lung cancer Neg Hx     CURRENT MEDICATIONS:  Outpatient Encounter Medications as of 12/16/2023  Medication Sig Note   acetaminophen  (TYLENOL ) 500 MG tablet Take 500-1,000 mg by mouth every 6 (six) hours as needed (pain.).    albuterol  (PROVENTIL ) (2.5 MG/3ML) 0.083% nebulizer solution Take 3 mLs (2.5 mg total) by nebulization every 6 (six) hours as needed for wheezing or shortness of breath.    albuterol  (VENTOLIN  HFA) 108 (90 Base) MCG/ACT inhaler Inhale 1-2 puffs into the lungs every 6 (six) hours as needed for wheezing or shortness of breath.    budesonide -formoterol  (SYMBICORT ) 80-4.5 MCG/ACT inhaler Inhale 2 puffs into the lungs in the morning and at bedtime. (Patient taking differently: Inhale 2 puffs into the lungs 2 (two) times daily as needed (Asthma).)    Cyanocobalamin (B-12 PO) Take 5,000 mcg by mouth daily. Liquid    EPINEPHrine  0.3 mg/0.3 mL IJ SOAJ injection Inject 0.3 mg into the muscle as needed for anaphylaxis.     hydrocortisone  cream 1 % Apply 1 Application topically 2 (two) times daily.    hydrOXYzine  (VISTARIL ) 25 MG capsule Take 1 capsule (25 mg total) by mouth  at bedtime.    ibuprofen  (ADVIL ) 800 MG tablet Take 1 tablet (800 mg total) by mouth every 8 (eight) hours as needed.    meclizine  (ANTIVERT ) 12.5 MG tablet Take 1 tablet (12.5 mg total) by mouth 3 (three) times daily as needed for dizziness.    Multiple Vitamin (MULTIVITAMIN) tablet Take 1 tablet by mouth daily.    oxyCODONE  (OXY IR/ROXICODONE ) 5 MG immediate release tablet Take 1 tablet (5 mg total) by mouth every 6 (six) hours as needed for severe pain (pain score 7-10) or breakthrough pain. (Patient not taking: Reported on 11/21/2023)    pantoprazole  (PROTONIX ) 40 MG tablet Take 1 tablet (40 mg total) by mouth daily as needed.    phentermine  37.5 MG capsule Take 1 capsule (37.5 mg total) by mouth every morning. (Patient taking differently: Take 37.5 mg by mouth daily.) 09/26/2023: MED ON HOLD For procedure    Respiratory Therapy Supplies (NEBULIZER MASK ADULT) MISC 1 each by Does not apply route as needed.    No facility-administered encounter medications on file as of 12/16/2023.    ALLERGIES:  Allergies  Allergen Reactions   Shellfish Allergy Itching   Penicillins Diarrhea and Nausea And Vomiting    Has patient had a PCN reaction causing immediate rash, facial/tongue/throat swelling, SOB or lightheadedness with hypotension: No Has patient had a PCN reaction causing severe rash involving mucus membranes or skin necrosis: No Has patient had a PCN reaction that required hospitalization No Has patient had a PCN reaction occurring within the last 10 years: Yes If all of the above answers are NO, then may proceed with Cephalosporin use.     LABORATORY DATA:  I have reviewed the labs as listed.  CBC    Component Value Date/Time   WBC 10.7 (H) 12/09/2023 1439   RBC 4.36 12/09/2023 1439   HGB 11.9 (L) 12/09/2023 1439   HGB 10.9 (L)  09/16/2023 0823   HCT 37.4 12/09/2023 1439   HCT 34.7 09/16/2023 0823   PLT 231 12/09/2023 1439   PLT 223 09/16/2023 0823   MCV 85.8 12/09/2023 1439   MCV 84 09/16/2023 0823   MCH 27.3 12/09/2023 1439   MCHC 31.8 12/09/2023 1439   RDW 13.2 12/09/2023 1439   RDW 14.4 09/16/2023 0823   LYMPHSABS 3.5 12/09/2023 1439   LYMPHSABS 1.6 09/16/2023 0823   MONOABS 0.9 12/09/2023 1439   EOSABS 0.1 12/09/2023 1439   EOSABS 0.1 09/16/2023 0823   BASOSABS 0.1 12/09/2023 1439   BASOSABS 0.0 09/16/2023 0823      Latest Ref Rng & Units 09/16/2023    8:23 AM 01/14/2023   10:01 AM 06/14/2021    8:56 AM  CMP  Glucose 70 - 99 mg/dL 76  79  80   BUN 6 - 24 mg/dL 7  12  8    Creatinine 0.57 - 1.00 mg/dL 9.19  9.04  9.11   Sodium 134 - 144 mmol/L 139  141  140   Potassium 3.5 - 5.2 mmol/L 4.6  4.5  4.9   Chloride 96 - 106 mmol/L 104  103  104   CO2 20 - 29 mmol/L 23  22  25    Calcium 8.7 - 10.2 mg/dL 9.2  9.6  9.5   Total Protein 6.0 - 8.5 g/dL  7.4  7.2   Total Bilirubin 0.0 - 1.2 mg/dL  0.2  <9.7   Alkaline Phos 44 - 121 IU/L  87  104   AST 0 - 40 IU/L  19  17   ALT 0 - 32 IU/L  21  21     DIAGNOSTIC IMAGING:  I have independently reviewed the relevant imaging and discussed with the patient.   WRAP UP:  All questions were answered. The patient knows to call the clinic with any problems, questions or concerns.  Medical decision making: ***  Time spent on visit: I spent *** minutes counseling the patient face to face. The total time spent in the appointment was *** minutes and more than 50% was on counseling.  Pleasant CHRISTELLA Barefoot, PA-C  ***

## 2023-12-16 ENCOUNTER — Inpatient Hospital Stay (HOSPITAL_BASED_OUTPATIENT_CLINIC_OR_DEPARTMENT_OTHER): Admitting: Physician Assistant

## 2023-12-16 VITALS — BP 112/74 | HR 78 | Temp 99.3°F | Resp 18 | Wt 205.2 lb

## 2023-12-16 DIAGNOSIS — D509 Iron deficiency anemia, unspecified: Secondary | ICD-10-CM | POA: Diagnosis not present

## 2023-12-16 DIAGNOSIS — D500 Iron deficiency anemia secondary to blood loss (chronic): Secondary | ICD-10-CM

## 2023-12-16 NOTE — Patient Instructions (Addendum)
 [facility] at [facility] **VISIT SUMMARY & IMPORTANT INSTRUCTIONS **   You were seen today by Pleasant Barefoot PA-C for your iron  deficiency anemia.    IRON  DEFICIENCY ANEMIA The main cause of your iron  deficiency anemia was your abnormal uterine bleeding.  Since this has been resolved via hysterectomy, you do not need to continue follow-up at the [facility]. Your blood levels look great!  Your iron  levels are mildly low.  I recommend that you take iron  supplement every other day. Your primary care provider can continue to follow your blood and iron  levels.  If you need any IV iron  in the future, you can be referred back to us  at that time.  ** Thank you for trusting me with your healthcare!  I strive to provide all of my patients with quality care at each visit.  If you receive a survey for this visit, I would be so grateful to you for taking the time to provide feedback.  Thank you in advance!  ~ Ardis Lawley                   Dr. Alean Stands   &   Pleasant Barefoot, PA-C   - - - - - - - - - - - - - - - - - -    Thank you for choosing [facility] at [facility] to provide your oncology and hematology care.  To afford each patient quality time with our provider, please arrive at least 15 minutes before your scheduled appointment time.   If you have a lab appointment with the Cancer Center please come in thru the Main Entrance and check in at the main information desk.  You need to re-schedule your appointment should you arrive 10 or more minutes late.  We strive to give you quality time with our providers, and arriving late affects you and other patients whose appointments are after yours.  Also, if you no show three or more times for appointments you may be dismissed from the clinic at the providers discretion.     Again, thank you for choosing [facility].  Our hope is that these requests will decrease the amount of time  that you wait before being seen by our physicians.       _____________________________________________________________  Should you have questions after your visit to [facility], please contact our office at [phone] and follow the prompts.  Our office hours are 8:00 a.m. and 4:30 p.m. Monday - Friday.  Please note that voicemails left after 4:00 p.m. may not be returned until the following business day.  We are closed weekends and major holidays.  You do have access to a nurse 24-7, just call the main number to the clinic [phone] and do not press any options, hold on the line and a nurse will answer the phone.    For prescription refill requests, have your pharmacy contact our office and allow 72 hours.

## 2023-12-20 ENCOUNTER — Encounter: Payer: Self-pay | Admitting: Orthopaedic Surgery

## 2023-12-20 ENCOUNTER — Other Ambulatory Visit: Payer: Self-pay | Admitting: Orthopaedic Surgery

## 2023-12-20 DIAGNOSIS — M25521 Pain in right elbow: Secondary | ICD-10-CM

## 2023-12-20 DIAGNOSIS — M898X2 Other specified disorders of bone, upper arm: Secondary | ICD-10-CM

## 2023-12-26 ENCOUNTER — Ambulatory Visit
Admission: RE | Admit: 2023-12-26 | Discharge: 2023-12-26 | Disposition: A | Discharge status: Home / Self Care | Source: Ambulatory Visit | Attending: Orthopaedic Surgery | Admitting: Orthopaedic Surgery

## 2023-12-26 DIAGNOSIS — M898X2 Other specified disorders of bone, upper arm: Secondary | ICD-10-CM

## 2023-12-26 DIAGNOSIS — M25521 Pain in right elbow: Secondary | ICD-10-CM

## 2024-01-03 ENCOUNTER — Other Ambulatory Visit: Payer: Self-pay

## 2024-01-03 ENCOUNTER — Ambulatory Visit (HOSPITAL_COMMUNITY): Attending: Physician Assistant | Admitting: Occupational Therapy

## 2024-01-03 ENCOUNTER — Encounter (HOSPITAL_COMMUNITY): Payer: Self-pay | Admitting: Occupational Therapy

## 2024-01-03 DIAGNOSIS — M79621 Pain in right upper arm: Secondary | ICD-10-CM | POA: Insufficient documentation

## 2024-01-03 DIAGNOSIS — R29898 Other symptoms and signs involving the musculoskeletal system: Secondary | ICD-10-CM | POA: Insufficient documentation

## 2024-01-03 NOTE — Therapy (Signed)
 OUTPATIENT OCCUPATIONAL THERAPY ORTHO EVALUATION  Patient Name: Martha [name] MRN: [id number] DOB:01-30-1981, 43 y.o., female Today's Date: 01/03/2024   END OF SESSION:  OT End of Session - 01/03/24 1052     Visit Number 1    Number of Visits 4    Date for OT Re-Evaluation 02/02/24    Authorization Type Aetna/State, copay$52    Authorization Time Period no auth required    OT Start Time 1017    OT Stop Time 1042    OT Time Calculation (min) 25 min    Activity Tolerance Patient tolerated treatment well    Behavior During Therapy WFL for tasks assessed/performed          Past Medical History:  Diagnosis Date   Abnormal uterine bleeding (AUB)    GERD (gastroesophageal reflux disease)    Hiatal hernia    History of cervical dysplasia 2005   CIN 2  s/p laser vaporization 2005   Hyperlipidemia, mixed    Iron  deficiency anemia due to chronic blood loss    hematology/ oncology--- pleasant pennington PA,  treated w/ iv iron ,  last infusion 06-17-2023   Mild persistent asthma    Pre-diabetes    Spinal arachnoid cyst 2020   mild intradural spinal cord flattening @ T2-3 intradural  (per pt followed by neuro surgeon, asymptomatic)   Vitamin D  deficiency    Wears glasses    Past Surgical History:  Procedure Laterality Date   ABDOMINAL HYSTERECTOMY  10/02/2023   Partial   BIOPSY  03/30/2020   Procedure: BIOPSY;  Surgeon: [name], MD;  [facility];  Service: Endoscopy;;   COLONOSCOPY N/A 03/30/2020   normal colon, normal TI.    DIAGNOSTIC LAPAROSCOPY  09/09/2001   @[facility] ;  AND D&C HYSTEROSCOPY   DILATATION & CURETTAGE/HYSTEROSCOPY WITH MYOSURE N/A 01/31/2022   Procedure: DILATATION & CURETTAGE/HYSTEROSCOPY WITH MYOSURE;  Surgeon: [name], DO;  Location:  SURGERY CENTER;  Service: Gynecology;  Laterality: N/A;   ESOPHAGOGASTRODUODENOSCOPY  01/2010   Dr. shaaron: small hiatal hernia   ESOPHAGOGASTRODUODENOSCOPY N/A 03/30/2020   mild erosive  reflux esophagitis, medium-sized hiatal hernia s/p biopsy, normal duodenum. Schatzki's ring. Negative H.pylori.    LASER ABLATION OF THE CERVIX  12/14/2003   @[facility]   (CO2 laser vaporization of CIN)   ROBOTIC ASSISTED LAPAROSCOPIC HYSTERECTOMY AND SALPINGECTOMY Bilateral 10/02/2023   Procedure: XI ROBOTIC ASSISTED LAPAROSCOPIC HYSTERECTOMY AND BILATERAL SALPINGECTOMY;  Surgeon: [name], DO;  Location: [facility];  Service: Gynecology;  Laterality: Bilateral;   TUBAL LIGATION Bilateral 06/26/2015   Procedure: POST PARTUM TUBAL LIGATION;  Surgeon: [name], MD;  Location: [facility];  Service: Gynecology;  Laterality: Bilateral;   WRIST GANGLION EXCISION  07/22/2009   @[facility]    Patient Active Problem List   Diagnosis Date Noted   Allergy 11/21/2023   Pruritus 11/08/2023   Abnormal uterine bleeding (AUB) 10/02/2023   Dysuria 02/15/2023   Obesity (BMI 30-39.9) 02/15/2023   Abnormal uterine and vaginal bleeding, unspecified 01/14/2023   CIN I (cervical intraepithelial neoplasia I) 01/14/2023   Endometrial mass 01/14/2023   Spinal arachnoid cyst 01/14/2023   Urine pregnancy test negative 01/14/2023   Insomnia 01/14/2023   Acute viral bronchitis 04/27/2022   Head ache 07/18/2021   Encounter for routine adult physical exam with abnormal findings 06/14/2021   Need for Tdap vaccination 06/14/2021   Lightheadedness 06/14/2021   Hematemesis 07/26/2020   Iron  deficiency anemia 07/25/2020   URI (upper respiratory infection) 07/01/2020   Lower respiratory tract  infection 05/23/2020   IDA (iron  deficiency anemia) 02/03/2020   Heartburn 02/03/2020   Complaint of melena 12/10/2019   Arachnoid cyst 09/11/2018   Carpal tunnel syndrome of right wrist 07/10/2018   Cubital tunnel syndrome 07/10/2018   Degeneration of lumbar intervertebral disc 07/10/2018   Low back pain 07/10/2018   Scoliosis deformity of spine 07/10/2018   Mass of joint of right wrist 07/15/2017   Iron  deficiency anemia of  pregnancy 06/27/2015   Dyslipidemia 10/21/2012   Prediabetes 10/21/2012   Gastroesophageal reflux disease 01/30/2011    PCP: Hilario Terry Wilhelmena Lloyd, FNP  REFERRING PROVIDER: Aleck Stalling, PA-C  ONSET DATE: 11/23/22  REFERRING DIAG: Right arm contusion  THERAPY DIAG:  Pain in right upper arm  Other symptoms and signs involving the musculoskeletal system  Rationale for Evaluation and Treatment: Rehabilitation  SUBJECTIVE:   SUBJECTIVE STATEMENT: S: I slipped in a shower and thought I had fractured my elbow Pt accompanied by: self  PERTINENT HISTORY: Pt is a 44 y/o female who had a fall in a hotel shower on 11/23/23. Pt wore a soft splint for a couples of weeks then a sling, this has been the first week without anything.   PRECAUTIONS: None  WEIGHT BEARING RESTRICTIONS: No  PAIN:  Are you having pain? Yes: NPRS scale: 1/10 Pain location: tricep Pain description: pulling Aggravating factors: reaching back Relieving factors: resting  FALLS: Has patient fallen in last 6 months? Yes. Number of falls 1  PLOF: Independent  PATIENT GOALS: To be able to use the right arm.    OBJECTIVE:   HAND DOMINANCE: Right  ADLs: Overall ADLs: Reaching back into extension is difficult, cannot lay on the right arm for long. Pt has not tried to lift anything heavy, has lifted groceries at waist height.    UPPER EXTREMITY ROM:       BUE A/ROM is WNL    UPPER EXTREMITY MMT:     Assessed in sitting, er/IR adducted  MMT Right eval  Shoulder flexion 4/5  Shoulder abduction 4/5  Shoulder internal rotation 5/5  Shoulder external rotation 4/5  Elbow flexion 4/5  Elbow extension 4/5  (Blank rows = not tested)  HAND FUNCTION: Grip strength: Right: 52 lbs; Left: 60 lbs  SENSATION: WFL  EDEMA: None  COGNITION: Overall cognitive status: Within functional limits for tasks assessed    TODAY'S TREATMENT:                                                                                                                               DATE:  Eval:  -A/ROM: shoulder protraction, flexion, horizontal abduction, er, abduction, 10 reps    PATIENT EDUCATION: Education details: shoulder/elbow A/ROM; use red theraputty Person educated: Patient Education method: Explanation, Demonstration, and Handouts Education comprehension: verbalized understanding and returned demonstration  HOME EXERCISE PROGRAM: Eval: shoulder/elbow A/ROM; use red theraputty  GOALS: Goals reviewed with patient? Yes   SHORT TERM GOALS: Target date: 02/03/24  Pt will be provided with and educated on HEP to improve mobility in RUE required for use during ADL completion.   Goal status: INITIAL  2.  Pt will decrease pain in RUE to 3/10 or less to improve ability to sleep for 2+ consecutive hours without waking due to pain.  Goal status: INITIAL  3.  Pt will increase RUE strength to 5/5 to improve ability to lifting or carrying items during meal preparation/housework/yardwork tasks.   Goal status: INITIAL  4.  Pt will return to highest level of function using RUE as dominant during functional task completion  Goal status: INITIAL  5.  Pt will increase right grip strength by 10# to improve ability to grasp and move or carry objects as needed during housework or schoolwork tasks.    Goal status: INITIAL    ASSESSMENT:  CLINICAL IMPRESSION: Patient is a 43 y.o. female who was seen today for occupational therapy evaluation for right arm contusion and pain. Pt presents with increased pain, decreased ROM, strength, and functional use of the RUE. Pt sustained a fall on 11/23/23, pain and function have improved but continues to have difficulty with lifting and functional use of the RUE, as well as a pulling sensation along the tricep from shoulder to elbow.    PERFORMANCE DEFICITS: in functional skills including in functional skills including ADLs, IADLs, coordination, tone, ROM, strength,  pain, fascial restrictions, muscle spasms, and UE functional use  IMPAIRMENTS: are limiting patient from ADLs, IADLs, rest and sleep, work, and leisure.   COMORBIDITIES: has no other co-morbidities that affects occupational performance. Patient will benefit from skilled OT to address above impairments and improve overall function.  MODIFICATION OR ASSISTANCE TO COMPLETE EVALUATION: No modification of tasks or assist necessary to complete an evaluation.  OT OCCUPATIONAL PROFILE AND HISTORY: Problem focused assessment: Including review of records relating to presenting problem.  CLINICAL DECISION MAKING: LOW - limited treatment options, no task modification necessary  REHAB POTENTIAL: Good  EVALUATION COMPLEXITY: Low      PLAN:  OT FREQUENCY: 1x/week  OT DURATION: 4 weeks  PLANNED INTERVENTIONS: 97168 OT Re-evaluation, 97535 self care/ADL training, 02889 therapeutic exercise, 97530 therapeutic activity, 97112 neuromuscular re-education, 97140 manual therapy, patient/family education, and DME and/or AE instructions  RECOMMENDED OTHER SERVICES: None at this time  CONSULTED AND AGREED WITH PLAN OF CARE: Patient  PLAN FOR NEXT SESSION: Follow up on HEP, progress to strengthening as able, proximal shoulder strengthening, grip strengthening   Sonny Cory, OTR/L  [phone] 01/03/2024, 10:53 AM

## 2024-01-03 NOTE — Patient Instructions (Signed)
 Repeat all exercises 10-15 times, 1-2 times per day.  1) Shoulder Protraction    Begin with elbows by your side, slowly punch straight out in front of you.      2) Shoulder Flexion  Standing:         Begin with arms at your side with thumbs pointed up, slowly raise both arms up and forward towards overhead.               3) Horizontal abduction/adduction  Standing:           Begin with arms straight out in front of you, bring out to the side in at T shape. Keep arms straight entire time.                 4) Internal & External Rotation   Standing:     Stand with elbows at the side and elbows bent 90 degrees. Move your forearms away from your body, then bring back inward toward the body.     5) Shoulder Abduction  Standing:       Lying on your back begin with your arms flat on the table next to your side. Slowly move your arms out to the side so that they go overhead, in a jumping jack or snow angel movement.    6) Elbow flexion and extension Bend your elbow upwards as shown and then lower to a straighten position.

## 2024-01-10 ENCOUNTER — Ambulatory Visit (HOSPITAL_COMMUNITY): Admitting: Occupational Therapy

## 2024-01-10 ENCOUNTER — Encounter (HOSPITAL_COMMUNITY): Payer: Self-pay | Admitting: Occupational Therapy

## 2024-01-10 DIAGNOSIS — M79621 Pain in right upper arm: Secondary | ICD-10-CM

## 2024-01-10 DIAGNOSIS — R29898 Other symptoms and signs involving the musculoskeletal system: Secondary | ICD-10-CM

## 2024-01-10 NOTE — Therapy (Signed)
 OUTPATIENT OCCUPATIONAL THERAPY ORTHO TREATMENT  Patient Name: Martha [name] MRN: [id number] DOB:11/24/1980, 43 y.o., female Today's Date: 01/10/2024   END OF SESSION:  OT End of Session - 01/10/24 1051     Visit Number 2    Number of Visits 4    Date for OT Re-Evaluation 02/02/24    Authorization Type Aetna/State, copay$52    Authorization Time Period no auth required    OT Start Time 1015    OT Stop Time 1053    OT Time Calculation (min) 38 min    Activity Tolerance Patient tolerated treatment well    Behavior During Therapy WFL for tasks assessed/performed           Past Medical History:  Diagnosis Date   Abnormal uterine bleeding (AUB)    GERD (gastroesophageal reflux disease)    Hiatal hernia    History of cervical dysplasia 2005   CIN 2  s/p laser vaporization 2005   Hyperlipidemia, mixed    Iron  deficiency anemia due to chronic blood loss    hematology/ oncology--- [facility] PA,  treated w/ iv iron ,  last infusion 06-17-2023   Mild persistent asthma    Pre-diabetes    Spinal arachnoid cyst 2020   mild intradural spinal cord flattening @ T2-3 intradural  (per pt followed by neuro surgeon, asymptomatic)   Vitamin D  deficiency    Wears glasses    Past Surgical History:  Procedure Laterality Date   ABDOMINAL HYSTERECTOMY  10/02/2023   Partial   BIOPSY  03/30/2020   Procedure: BIOPSY;  Surgeon: [name], MD;  [facility];  Service: Endoscopy;;   COLONOSCOPY N/A 03/30/2020   normal colon, normal TI.    DIAGNOSTIC LAPAROSCOPY  09/09/2001   @[facility] ;  AND D&C HYSTEROSCOPY   DILATATION & CURETTAGE/HYSTEROSCOPY WITH MYOSURE N/A 01/31/2022   Procedure: DILATATION & CURETTAGE/HYSTEROSCOPY WITH MYOSURE;  Surgeon: [name], DO;  Location: [facility];  Service: Gynecology;  Laterality: N/A;   ESOPHAGOGASTRODUODENOSCOPY  01/2010   Dr. shaaron: small hiatal hernia   ESOPHAGOGASTRODUODENOSCOPY N/A 03/30/2020   mild erosive  reflux esophagitis, medium-sized hiatal hernia s/p biopsy, normal duodenum. Schatzki's ring. Negative H.pylori.    LASER ABLATION OF THE CERVIX  12/14/2003   @[facility]   (CO2 laser vaporization of CIN)   ROBOTIC ASSISTED LAPAROSCOPIC HYSTERECTOMY AND SALPINGECTOMY Bilateral 10/02/2023   Procedure: XI ROBOTIC ASSISTED LAPAROSCOPIC HYSTERECTOMY AND BILATERAL SALPINGECTOMY;  Surgeon: [name], DO;  Location: [facility];  Service: Gynecology;  Laterality: Bilateral;   TUBAL LIGATION Bilateral 06/26/2015   Procedure: POST PARTUM TUBAL LIGATION;  Surgeon: [name], MD;  Location: [facility];  Service: Gynecology;  Laterality: Bilateral;   WRIST GANGLION EXCISION  07/22/2009   @[facility]    Patient Active Problem List   Diagnosis Date Noted   Allergy 11/21/2023   Pruritus 11/08/2023   Abnormal uterine bleeding (AUB) 10/02/2023   Dysuria 02/15/2023   Obesity (BMI 30-39.9) 02/15/2023   Abnormal uterine and vaginal bleeding, unspecified 01/14/2023   CIN I (cervical intraepithelial neoplasia I) 01/14/2023   Endometrial mass 01/14/2023   Spinal arachnoid cyst 01/14/2023   Urine pregnancy test negative 01/14/2023   Insomnia 01/14/2023   Acute viral bronchitis 04/27/2022   Head ache 07/18/2021   Encounter for routine adult physical exam with abnormal findings 06/14/2021   Need for Tdap vaccination 06/14/2021   Lightheadedness 06/14/2021   Hematemesis 07/26/2020   Iron  deficiency anemia 07/25/2020   URI (upper respiratory infection) 07/01/2020   Lower respiratory  tract infection 05/23/2020   IDA (iron  deficiency anemia) 02/03/2020   Heartburn 02/03/2020   Complaint of melena 12/10/2019   Arachnoid cyst 09/11/2018   Carpal tunnel syndrome of right wrist 07/10/2018   Cubital tunnel syndrome 07/10/2018   Degeneration of lumbar intervertebral disc 07/10/2018   Low back pain 07/10/2018   Scoliosis deformity of spine 07/10/2018   Mass of joint of right wrist 07/15/2017   Iron  deficiency anemia of  pregnancy 06/27/2015   Dyslipidemia 10/21/2012   Prediabetes 10/21/2012   Gastroesophageal reflux disease 01/30/2011    PCP: Hilario Terry Wilhelmena Lloyd, FNP  REFERRING PROVIDER: Aleck Stalling, PA-C  ONSET DATE: 11/23/22  REFERRING DIAG: Right arm contusion  THERAPY DIAG:  Pain in right upper arm  Other symptoms and signs involving the musculoskeletal system  Rationale for Evaluation and Treatment: Rehabilitation  SUBJECTIVE:   SUBJECTIVE STATEMENT: S: I think I over did it yesterday Pt accompanied by: self  PERTINENT HISTORY: Pt is a 43 y/o female who had a fall in a hotel shower on 11/23/23. Pt wore a soft splint for a couples of weeks then a sling, this has been the first week without anything.   PRECAUTIONS: None  WEIGHT BEARING RESTRICTIONS: No  PAIN:  Are you having pain? Yes: NPRS scale: 6/10 Pain location: tricep Pain description: pulling Aggravating factors: reaching back Relieving factors: resting  FALLS: Has patient fallen in last 6 months? Yes. Number of falls 1  PLOF: Independent  PATIENT GOALS: To be able to use the right arm.    OBJECTIVE:   HAND DOMINANCE: Right  ADLs: Overall ADLs: Reaching back into extension is difficult, cannot lay on the right arm for long. Pt has not tried to lift anything heavy, has lifted groceries at waist height.    UPPER EXTREMITY ROM:       BUE A/ROM is WNL    UPPER EXTREMITY MMT:     Assessed in sitting, er/IR adducted  MMT Right eval  Shoulder flexion 4/5  Shoulder abduction 4/5  Shoulder internal rotation 5/5  Shoulder external rotation 4/5  Elbow flexion 4/5  Elbow extension 4/5  (Blank rows = not tested)  HAND FUNCTION: Grip strength: Right: 52 lbs; Left: 60 lbs  SENSATION: WFL  EDEMA: None  COGNITION: Overall cognitive status: Within functional limits for tasks assessed    TODAY'S TREATMENT:                                                                                                                               DATE:   01/10/24 -A/ROM: seated; flexion, abduction, horizontal abduction, external/internal rotation 10x  -Proximal shoulder strength: paddles, criss cross, circles, reverse circles 1 min 2 sets  -Manual techniques: to upper arm, trapezius, bicep region- noted increased fascial restrictions to trapezius region and pectoralis region  -Grip strength: rolling out putty, pinching putty, cookie cutters with R hand  -Pinch strength: using red clip to pick up sponges 20x  -  UBE: level 2, 3 mins forwards, 3 mins backwards   Eval:  -A/ROM: shoulder protraction, flexion, horizontal abduction, er, abduction, 10 reps    PATIENT EDUCATION: Education details: shoulder/elbow A/ROM; use red theraputty Person educated: Patient Education method: Explanation, Demonstration, and Handouts Education comprehension: verbalized understanding and returned demonstration  HOME EXERCISE PROGRAM: Eval: shoulder/elbow A/ROM; use red theraputty  GOALS: Goals reviewed with patient? Yes   SHORT TERM GOALS: Target date: 02/03/24  Pt will be provided with and educated on HEP to improve mobility in RUE required for use during ADL completion.   Goal status: INITIAL  2.  Pt will decrease pain in RUE to 3/10 or less to improve ability to sleep for 2+ consecutive hours without waking due to pain.  Goal status: INITIAL  3.  Pt will increase RUE strength to 5/5 to improve ability to lifting or carrying items during meal preparation/housework/yardwork tasks.   Goal status: INITIAL  4.  Pt will return to highest level of function using RUE as dominant during functional task completion  Goal status: INITIAL  5.  Pt will increase right grip strength by 10# to improve ability to grasp and move or carry objects as needed during housework or schoolwork tasks.    Goal status: INITIAL    ASSESSMENT:  CLINICAL IMPRESSION: Pt reports that she is experiencing increased pain levels,  most likely from doing laundry. She reports pain within the bicep region described as pulling when engaging in horizontal abduction movements. She also reports numbness in 4th and 5th digit- this started this morning. She stated this could be from sleeping on her R side. Initiated strengthening. Rest breaks given throughout as needed.   PERFORMANCE DEFICITS: in functional skills including in functional skills including ADLs, IADLs, coordination, tone, ROM, strength, pain, fascial restrictions, muscle spasms, and UE functional use  IMPAIRMENTS: are limiting patient from ADLs, IADLs, rest and sleep, work, and leisure.   COMORBIDITIES: has no other co-morbidities that affects occupational performance. Patient will benefit from skilled OT to address above impairments and improve overall function.  MODIFICATION OR ASSISTANCE TO COMPLETE EVALUATION: No modification of tasks or assist necessary to complete an evaluation.  OT OCCUPATIONAL PROFILE AND HISTORY: Problem focused assessment: Including review of records relating to presenting problem.  CLINICAL DECISION MAKING: LOW - limited treatment options, no task modification necessary  REHAB POTENTIAL: Good  EVALUATION COMPLEXITY: Low      PLAN:  OT FREQUENCY: 1x/week  OT DURATION: 4 weeks  PLANNED INTERVENTIONS: 97168 OT Re-evaluation, 97535 self care/ADL training, 02889 therapeutic exercise, 97530 therapeutic activity, 97112 neuromuscular re-education, 97140 manual therapy, patient/family education, and DME and/or AE instructions  RECOMMENDED OTHER SERVICES: None at this time  CONSULTED AND AGREED WITH PLAN OF CARE: Patient  PLAN FOR NEXT SESSION: Follow up on HEP, progress to strengthening as able, proximal shoulder strengthening, grip strengthening   Chiquita Sermon, OTR/L  [phone] 01/10/2024, 10:56 AM

## 2024-01-13 ENCOUNTER — Other Ambulatory Visit (HOSPITAL_COMMUNITY): Payer: Self-pay

## 2024-01-13 ENCOUNTER — Telehealth: Payer: Self-pay | Admitting: Pharmacy Technician

## 2024-01-13 ENCOUNTER — Encounter: Payer: Self-pay | Admitting: Hematology

## 2024-01-13 ENCOUNTER — Other Ambulatory Visit (HOSPITAL_BASED_OUTPATIENT_CLINIC_OR_DEPARTMENT_OTHER): Payer: Self-pay

## 2024-01-13 NOTE — Telephone Encounter (Signed)
 Pharmacy Patient Advocate Encounter   Received notification from Patient Pharmacy that prior authorization for Phentermine  37.5mg  is required/requested.   Insurance verification completed.   The patient is insured through [employer] [facility] .   Per test claim: PA required; PA submitted to above mentioned insurance via CoverMyMeds Key/confirmation #/EOC [id number] Status is pending

## 2024-01-13 NOTE — Telephone Encounter (Signed)
 Pharmacy Patient Advocate Encounter  Received notification from [employer] that Prior Authorization for Phentermine37.5mg  has been DENIED.  Full denial letter will be uploaded to the media tab. See denial reason below. Your plan only covers this drug for a total of 3 months within a 365-day period for your health condition. We have denied your request for this drug because it is for longer treatment.   PA #/Case ID/Reference #: PA Case ID #: [id number]  She can still get the medication, her ins just wont pay for it any more. She could get it from one of our [facility] for $15-$30 a month, I believe.

## 2024-01-14 ENCOUNTER — Other Ambulatory Visit (HOSPITAL_COMMUNITY): Payer: Self-pay

## 2024-01-17 ENCOUNTER — Encounter (HOSPITAL_COMMUNITY): Admitting: Occupational Therapy

## 2024-01-20 ENCOUNTER — Encounter (HOSPITAL_COMMUNITY): Admitting: Occupational Therapy

## 2024-01-24 ENCOUNTER — Ambulatory Visit: Payer: Self-pay | Admitting: Allergy & Immunology

## 2024-01-24 ENCOUNTER — Encounter (HOSPITAL_COMMUNITY): Admitting: Occupational Therapy

## 2024-01-27 ENCOUNTER — Ambulatory Visit (HOSPITAL_COMMUNITY)

## 2024-01-29 ENCOUNTER — Encounter (HOSPITAL_COMMUNITY): Payer: Self-pay

## 2024-01-29 ENCOUNTER — Ambulatory Visit (HOSPITAL_COMMUNITY)
Admission: RE | Admit: 2024-01-29 | Discharge: 2024-01-29 | Disposition: A | Discharge status: Home / Self Care | Source: Ambulatory Visit | Attending: Family Medicine | Admitting: Family Medicine

## 2024-01-29 DIAGNOSIS — Z1231 Encounter for screening mammogram for malignant neoplasm of breast: Secondary | ICD-10-CM | POA: Diagnosis present

## 2024-01-31 ENCOUNTER — Encounter (HOSPITAL_COMMUNITY): Admitting: Occupational Therapy

## 2024-02-05 ENCOUNTER — Ambulatory Visit (HOSPITAL_COMMUNITY): Attending: Physician Assistant | Admitting: Occupational Therapy

## 2024-02-05 ENCOUNTER — Encounter (HOSPITAL_COMMUNITY): Payer: Self-pay | Admitting: Occupational Therapy

## 2024-02-05 DIAGNOSIS — M79621 Pain in right upper arm: Secondary | ICD-10-CM | POA: Diagnosis present

## 2024-02-05 DIAGNOSIS — R29898 Other symptoms and signs involving the musculoskeletal system: Secondary | ICD-10-CM | POA: Diagnosis present

## 2024-02-05 NOTE — Patient Instructions (Signed)
 1) (Home) Extension: Isometric / Bilateral Arm Retraction - Sitting ? ? ?Facing anchor, hold hands and elbow at shoulder height, with elbow bent.  Pull arms back to squeeze shoulder blades together. Repeat 10-15 times. 1-3 times/day.  ? ?2) (Clinic) Extension / Flexion (Assist) ? ? ?Face anchor, pull arms back, keeping elbow straight, and squeze shoulder blades together. ?Repeat 10-15 times. 1-3 times/day.  ? ?Copyright ? V[employer]. All rights reserved.  ? ?3) (Home) Retraction: Row - Bilateral (Anchor) ? ? ?Facing anchor, arms reaching forward, pull hands toward stomach, keeping elbows bent and at your sides and pinching shoulder blades together. ?Repeat 10-15 times. 1-3 times/day.  ? ?Copyright ? V[employer]. All rights reserved.  ? ? ? ? ?Theraband strengthening: Complete 10-15X, 1-2X/day ? ?1) Shoulder protraction ? ?Anchor band in doorway, stand with back to door. Push your hand forward as much as you can to bringing your shoulder blades forward on your rib cage. ? ? ? ? ? ?2) Shoulder horizontal abduction ? ?Standing with a theraband anchored at chest height, begin with arm straight and some tension in the band. Move your arm out to your side (keeping straight the whole time). Bring the affected arm back to midline. ? ? ? ? ?3) Shoulder Internal Rotation ? ?While holding an elastic band at your side with your elbow bent, start with your hand away from your stomach, then pull the band towards your stomach. Keep your elbow near your side the entire time. ? ? ? ? ?4) Shoulder External Rotation ? ?While holding an elastic band at your side with your elbow bent, start with your hand near your stomach and then pull the band away. Keep your elbow at your side the entire time. ? ? ? ? ?5) Shoulder flexion ? ?While standing with back to the door, holding Theraband at hand level, raise arm in front of you.  Keep elbow straight through entire movement.  ? ? ? ? ?6) Shoulder abduction ? ?While holding an elastic band at your side, draw  up your arm to the side keeping your elbow straight. ?  ? ?

## 2024-02-05 NOTE — Therapy (Signed)
 OUTPATIENT OCCUPATIONAL THERAPY ORTHO TREATMENT DISCHARGE NOTE  Patient Name: Martha [name] MRN: [id number] DOB:[birth year], 43 y.o., female Today's Date: 02/07/2024  OCCUPATIONAL THERAPY DISCHARGE SUMMARY  Visits from Start of Care: 3  Current functional level related to goals / functional outcomes: Pt has met all OT goals, except for her pain goal. ROM and strength are all [facility].   Remaining deficits: Pt is continuing to have significant pain.   Education / Equipment: Pt provided comprehensive HEP   Plan: Patient agrees to discharge as all OT goals have been met except for her pain levels.       END OF SESSION:    02/05/24 1615  OT Visits / Re-Eval  Visit Number 3  Number of Visits 4  Date for OT Re-Evaluation 02/02/24  Authorization  Authorization Type [employer]/State, [id number]  Authorization Time Period no auth required  OT Time Calculation  OT Start Time 1539  OT Stop Time 1615  OT Time Calculation (min) 36 min  End of Session  Activity Tolerance Patient tolerated treatment well  Behavior During Therapy WFL for tasks assessed/performed     Past Medical History:  Diagnosis Date   Abnormal uterine bleeding (AUB)    GERD (gastroesophageal reflux disease)    Hiatal hernia    History of cervical dysplasia 2005   CIN 2  s/p laser vaporization 2005   Hyperlipidemia, mixed    Iron  deficiency anemia due to chronic blood loss    hematology/ oncology--- [facility],  treated w/ iv iron ,  last infusion 06-17-2023   Mild persistent asthma    Pre-diabetes    Spinal arachnoid cyst 2020   mild intradural spinal cord flattening @ T2-3 intradural  (per pt followed by neuro surgeon, asymptomatic)   Vitamin D  deficiency    Wears glasses    Past Surgical History:  Procedure Laterality Date   ABDOMINAL HYSTERECTOMY  10/02/2023   Partial   BIOPSY  03/30/2020   Procedure: BIOPSY;  Surgeon: [name], MD;  Location: [facility];  Service: Endoscopy;;    COLONOSCOPY N/A 03/30/2020   normal colon, normal TI.    DIAGNOSTIC LAPAROSCOPY  09/09/2001   @[facility] ;  AND D&C HYSTEROSCOPY   DILATATION & CURETTAGE/HYSTEROSCOPY WITH MYOSURE N/A 01/31/2022   Procedure: DILATATION & CURETTAGE/HYSTEROSCOPY WITH MYOSURE;  Surgeon: [name], DO;  Location: [facility];  Service: Gynecology;  Laterality: N/A;   ESOPHAGOGASTRODUODENOSCOPY  01/2010   Dr. shaaron: small hiatal hernia   ESOPHAGOGASTRODUODENOSCOPY N/A 03/30/2020   mild erosive reflux esophagitis, medium-sized hiatal hernia s/p biopsy, normal duodenum. Schatzki's ring. Negative H.pylori.    LASER ABLATION OF THE CERVIX  12/14/2003   @[facility]   (CO2 laser vaporization of CIN)   ROBOTIC ASSISTED LAPAROSCOPIC HYSTERECTOMY AND SALPINGECTOMY Bilateral 10/02/2023   Procedure: XI ROBOTIC ASSISTED LAPAROSCOPIC HYSTERECTOMY AND BILATERAL SALPINGECTOMY;  Surgeon: [name], DO;  Location: [facility];  Service: Gynecology;  Laterality: Bilateral;   TUBAL LIGATION Bilateral 06/26/2015   Procedure: POST PARTUM TUBAL LIGATION;  Surgeon: [name], MD;  Location: [facility];  Service: Gynecology;  Laterality: Bilateral;   WRIST GANGLION EXCISION  07/22/2009   @[facility]    Patient Active Problem List   Diagnosis Date Noted   Allergy 11/21/2023   Pruritus 11/08/2023   Abnormal uterine bleeding (AUB) 10/02/2023   Dysuria 02/15/2023   Obesity (BMI 30-39.9) 02/15/2023   Abnormal uterine and vaginal bleeding, unspecified 01/14/2023   CIN I (cervical intraepithelial neoplasia I) 01/14/2023   Endometrial mass 01/14/2023   Spinal  arachnoid cyst 01/14/2023   Urine pregnancy test negative 01/14/2023   Insomnia 01/14/2023   Acute viral bronchitis 04/27/2022   Head ache 07/18/2021   Encounter for routine adult physical exam with abnormal findings 06/14/2021   Need for Tdap vaccination 06/14/2021   Lightheadedness 06/14/2021   Hematemesis 07/26/2020   Iron  deficiency anemia 07/25/2020   URI (upper  respiratory infection) 07/01/2020   Lower respiratory tract infection 05/23/2020   IDA (iron  deficiency anemia) 02/03/2020   Heartburn 02/03/2020   Complaint of melena 12/10/2019   Arachnoid cyst 09/11/2018   Carpal tunnel syndrome of right wrist 07/10/2018   Cubital tunnel syndrome 07/10/2018   Degeneration of lumbar intervertebral disc 07/10/2018   Low back pain 07/10/2018   Scoliosis deformity of spine 07/10/2018   Mass of joint of right wrist 07/15/2017   Iron  deficiency anemia of pregnancy 06/27/2015   Dyslipidemia 10/21/2012   Prediabetes 10/21/2012   Gastroesophageal reflux disease 01/30/2011    PCP: Hilario Terry Wilhelmena Lloyd, FNP  REFERRING PROVIDER: Aleck Stalling, PA-C  ONSET DATE: 11/23/22  REFERRING DIAG: Right arm contusion  THERAPY DIAG:  Pain in right upper arm  Other symptoms and signs involving the musculoskeletal system  Rationale for Evaluation and Treatment: Rehabilitation  SUBJECTIVE:   SUBJECTIVE STATEMENT: S: I'm good sometimes and painful sometimes Pt accompanied by: self  PERTINENT HISTORY: Pt is a 43 y/o female who had a fall in a hotel shower on 11/23/23. Pt wore a soft splint for a couples of weeks then a sling, this has been the first week without anything.   PRECAUTIONS: None  WEIGHT BEARING RESTRICTIONS: No  PAIN:  Are you having pain? Yes: NPRS scale: 3/10 Pain location: shoulder girdle Pain description: sore Aggravating factors: reaching back Relieving factors: resting  FALLS: Has patient fallen in last 6 months? Yes. Number of falls 1  PLOF: Independent  PATIENT GOALS: To be able to use the right arm.    OBJECTIVE:   HAND DOMINANCE: Right  ADLs: Overall ADLs: Reaching back into extension is difficult, cannot lay on the right arm for long. Pt has not tried to lift anything heavy, has lifted groceries at waist height.    UPPER EXTREMITY ROM:       BUE A/ROM is WNL  UPPER EXTREMITY MMT:     Assessed in sitting,  er/IR adducted  MMT Right eval Right 02/05/24  Shoulder flexion 4/5 4+/5  Shoulder abduction 4/5 5/5  Shoulder internal rotation 5/5 5/5  Shoulder external rotation 4/5 4+/5  Elbow flexion 4/5 5/5  Elbow extension 4/5 5/5  (Blank rows = not tested)  HAND FUNCTION: Grip strength: Right: 52 lbs; Left: 60 lbs 8/13: Right: 90 lbs  SENSATION: WFL  EDEMA: None  COGNITION: Overall cognitive status: Within functional limits for tasks assessed    TODAY'S TREATMENT:  DATE:   02/05/24 -Reassessment -A/ROM: flexion, abduction, protraction, horizontal abduction, er/IR, x15 -Scapular Strengthening: red band, extension, retraction, rows, x15 -Shoulder Strengthening: red band, flexion, abduction, horizontal abduction, er/IR, x15  01/10/24 -A/ROM: seated; flexion, abduction, horizontal abduction, external/internal rotation 10x  -Proximal shoulder strength: paddles, criss cross, circles, reverse circles 1 min 2 sets  -Manual techniques: to upper arm, trapezius, bicep region- noted increased fascial restrictions to trapezius region and pectoralis region  -Grip strength: rolling out putty, pinching putty, cookie cutters with R hand  -Pinch strength: using red clip to pick up sponges 20x  -UBE: level 2, 3 mins forwards, 3 mins backwards   Eval:  -A/ROM: shoulder protraction, flexion, horizontal abduction, er, abduction, 10 reps    PATIENT EDUCATION: Education details: Network engineer Tband Person educated: Patient Education method: Explanation, Demonstration, and Handouts Education comprehension: verbalized understanding and returned demonstration  HOME EXERCISE PROGRAM: Eval: shoulder/elbow A/ROM; use red theraputty 8/15: Scapular and Shoulder Tband  GOALS: Goals reviewed with patient? Yes   SHORT TERM GOALS: Target date: 02/03/24  Pt will be  provided with and educated on HEP to improve mobility in RUE required for use during ADL completion.   Goal status: MET  2.  Pt will decrease pain in RUE to 3/10 or less to improve ability to sleep for 2+ consecutive hours without waking due to pain.  Goal status: NOT MET  3.  Pt will increase RUE strength to 5/5 to improve ability to lifting or carrying items during meal preparation/housework/yardwork tasks.   Goal status: MET  4.  Pt will return to highest level of function using RUE as dominant during functional task completion  Goal status: MET  5.  Pt will increase right grip strength by 10# to improve ability to grasp and move or carry objects as needed during housework or schoolwork tasks.    Goal status: MET    ASSESSMENT:  CLINICAL IMPRESSION: Pt completed reassessment this session where she demonstrated full ROM, WFL strength in all motions, and her grip strength full improved to baseline. OT and pt continuing to add to HEP, this session with more strengthening exercises using the theraband, which pt tolerated well with overall good form. Verbal and tactile cuing provided throughout session for positioning and technique. Pt has no further skilled OT needs and will be discharged from OP OT.   PERFORMANCE DEFICITS: in functional skills including in functional skills including ADLs, IADLs, coordination, tone, ROM, strength, pain, fascial restrictions, muscle spasms, and UE functional use   PLAN:  OT FREQUENCY: 1x/week  OT DURATION: 4 weeks  PLANNED INTERVENTIONS: 97168 OT Re-evaluation, 97535 self care/ADL training, 02889 therapeutic exercise, 97530 therapeutic activity, 97112 neuromuscular re-education, 97140 manual therapy, patient/family education, and DME and/or AE instructions  RECOMMENDED OTHER SERVICES: None at this time  CONSULTED AND AGREED WITH PLAN OF CARE: Patient  PLAN FOR NEXT SESSION: Discharge   Valentin Nightingale, OTR/L [phone] 02/07/2024, 9:20  AM

## 2024-02-25 ENCOUNTER — Emergency Department (HOSPITAL_COMMUNITY)
Admission: EM | Admit: 2024-02-25 | Discharge: 2024-02-25 | Disposition: A | Discharge status: Home / Self Care | Attending: Emergency Medicine | Admitting: Emergency Medicine

## 2024-02-25 ENCOUNTER — Emergency Department (HOSPITAL_COMMUNITY)

## 2024-02-25 ENCOUNTER — Other Ambulatory Visit: Payer: Self-pay

## 2024-02-25 ENCOUNTER — Encounter (HOSPITAL_COMMUNITY): Payer: Self-pay

## 2024-02-25 DIAGNOSIS — N132 Hydronephrosis with renal and ureteral calculous obstruction: Secondary | ICD-10-CM | POA: Insufficient documentation

## 2024-02-25 DIAGNOSIS — Z7951 Long term (current) use of inhaled steroids: Secondary | ICD-10-CM | POA: Diagnosis not present

## 2024-02-25 DIAGNOSIS — N200 Calculus of kidney: Secondary | ICD-10-CM

## 2024-02-25 DIAGNOSIS — J45909 Unspecified asthma, uncomplicated: Secondary | ICD-10-CM | POA: Diagnosis not present

## 2024-02-25 DIAGNOSIS — R109 Unspecified abdominal pain: Secondary | ICD-10-CM | POA: Diagnosis present

## 2024-02-25 LAB — COMPREHENSIVE METABOLIC PANEL WITH GFR
ALT: 28 U/L (ref 0–44)
AST: 26 U/L (ref 15–41)
Albumin: 3.9 g/dL (ref 3.5–5.0)
Alkaline Phosphatase: 76 U/L (ref 38–126)
Anion gap: 8 (ref 5–15)
BUN: 11 mg/dL (ref 6–20)
CO2: 24 mmol/L (ref 22–32)
Calcium: 9.2 mg/dL (ref 8.9–10.3)
Chloride: 105 mmol/L (ref 98–111)
Creatinine, Ser: 1.02 mg/dL — ABNORMAL HIGH (ref 0.44–1.00)
GFR, Estimated: >60 mL/min (ref >60)
Glucose, Bld: 103 mg/dL — ABNORMAL HIGH (ref 70–99)
Potassium: 3.8 mmol/L (ref 3.5–5.1)
Sodium: 137 mmol/L (ref 135–145)
Total Bilirubin: 0.5 mg/dL (ref 0.0–1.2)
Total Protein: 7.4 g/dL (ref 6.5–8.1)

## 2024-02-25 LAB — CBC WITH DIFFERENTIAL/PLATELET
Abs Immature Granulocytes: 0.02 K/uL (ref 0.00–0.07)
Basophils Absolute: 0 K/uL (ref 0.0–0.1)
Basophils Relative: 0 %
Eosinophils Absolute: 0 K/uL (ref 0.0–0.5)
Eosinophils Relative: 1 %
HCT: 36.7 % (ref 36.0–46.0)
Hemoglobin: 11.4 g/dL — ABNORMAL LOW (ref 12.0–15.0)
Immature Granulocytes: 0 %
Lymphocytes Relative: 19 %
Lymphs Abs: 1.6 K/uL (ref 0.7–4.0)
MCH: 26.6 pg (ref 26.0–34.0)
MCHC: 31.1 g/dL (ref 30.0–36.0)
MCV: 85.7 fL (ref 80.0–100.0)
Monocytes Absolute: 0.6 K/uL (ref 0.1–1.0)
Monocytes Relative: 7 %
Neutro Abs: 6.2 K/uL (ref 1.7–7.7)
Neutrophils Relative %: 73 %
Platelets: 212 K/uL (ref 150–400)
RBC: 4.28 MIL/uL (ref 3.87–5.11)
RDW: 13.7 % (ref 11.5–15.5)
WBC: 8.4 K/uL (ref 4.0–10.5)
nRBC: 0 % (ref 0.0–0.2)

## 2024-02-25 LAB — URINALYSIS, ROUTINE W REFLEX MICROSCOPIC
Bilirubin Urine: NEGATIVE
Glucose, UA: NEGATIVE mg/dL
Hgb urine dipstick: NEGATIVE
Ketones, ur: NEGATIVE mg/dL
Leukocytes,Ua: NEGATIVE
Nitrite: NEGATIVE
Protein, ur: NEGATIVE mg/dL
Specific Gravity, Urine: 1.024 (ref 1.005–1.030)
pH: 5 (ref 5.0–8.0)

## 2024-02-25 LAB — LIPASE, BLOOD: Lipase: 34 U/L (ref 11–51)

## 2024-02-25 MED ORDER — ONDANSETRON 4 MG PO TBDP: 4.0000 mg | ORAL_TABLET | Freq: Three times a day (TID) | ORAL | 0 refills | Status: AC | PRN

## 2024-02-25 MED ORDER — OXYCODONE-ACETAMINOPHEN 5-325 MG PO TABS
1.0000 | ORAL_TABLET | Freq: Once | ORAL | Status: AC
  Administered 2024-02-25: 1 via ORAL
  Filled 2024-02-25: qty 1

## 2024-02-25 MED ORDER — KETOROLAC TROMETHAMINE 60 MG/2ML IM SOLN
30.0000 mg | Freq: Once | INTRAMUSCULAR | Status: AC
  Administered 2024-02-25: 30 mg via INTRAMUSCULAR
  Filled 2024-02-25 (×2): qty 2

## 2024-02-25 MED ORDER — ONDANSETRON 4 MG PO TBDP
4.0000 mg | ORAL_TABLET | Freq: Once | ORAL | Status: DC
  Filled 2024-02-25: qty 1

## 2024-02-25 MED ORDER — OXYCODONE-ACETAMINOPHEN 5-325 MG PO TABS: 1.0000 | ORAL_TABLET | Freq: Three times a day (TID) | ORAL | 0 refills | Status: AC | PRN

## 2024-02-25 NOTE — ED Triage Notes (Signed)
 Pt arrived via POV c/o sharp stabbing right flank pain that began apprx 90 minutes PTA. Pt endorses nausea w/o emesis.

## 2024-02-25 NOTE — ED Provider Notes (Signed)
 [facility] AT [facility] Provider Note   CSN: [id number] Arrival date & time: 02/25/24  1023     Patient presents with: Flank Pain   Martha [name] is a 43 y.o. female.    Flank Pain  Patient presents for right flank pain.  Medical history includes GERD, HLD, prediabetes, asthma, anemia.  Onset of pain was 90 manage prior to arrival.  She has had nausea without emesis.  She took 500 mg of Tylenol  prior to arrival.     Prior to Admission medications   Medication Sig Start Date End Date Taking? Authorizing Provider  ondansetron  (ZOFRAN -ODT) 4 MG disintegrating tablet Take 1 tablet (4 mg total) by mouth every 8 (eight) hours as needed. 02/25/24  Yes Melvenia Motto, MD  oxyCODONE -acetaminophen  (PERCOCET/ROXICET) 5-325 MG tablet Take 1 tablet by mouth every 8 (eight) hours as needed for up to 3 days for severe pain (pain score 7-10). 02/25/24 02/28/24 Yes Melvenia Motto, MD  acetaminophen  (TYLENOL ) 500 MG tablet Take 500-1,000 mg by mouth every 6 (six) hours as needed (pain.).    [provider]  albuterol  (PROVENTIL ) (2.5 MG/3ML) 0.083% nebulizer solution Take 3 mLs (2.5 mg total) by nebulization every 6 (six) hours as needed for wheezing or shortness of breath. 07/05/20   Elnor Fairy CHRISTELLA, NP  albuterol  (VENTOLIN  HFA) 108 (90 Base) MCG/ACT inhaler Inhale 1-2 puffs into the lungs every 6 (six) hours as needed for wheezing or shortness of breath. 10/14/22   Raspet, Erin K, PA-C  budesonide -formoterol  (SYMBICORT ) 80-4.5 MCG/ACT inhaler Inhale 2 puffs into the lungs in the morning and at bedtime. Patient taking differently: Inhale 2 puffs into the lungs 2 (two) times daily as needed (Asthma). 10/14/22   Raspet, Erin K, PA-C  Cyanocobalamin (B-12 PO) Take 5,000 mcg by mouth daily. Liquid    [provider]  EPINEPHrine  0.3 mg/0.3 mL IJ SOAJ injection Inject 0.3 mg into the muscle as needed for anaphylaxis. 11/21/23   Del Wilhelmena Lloyd Sola, FNP  hydrocortisone   cream 1 % Apply 1 Application topically 2 (two) times daily. 11/08/23   Del Wilhelmena Lloyd Sola, FNP  hydrOXYzine  (VISTARIL ) 25 MG capsule Take 1 capsule (25 mg total) by mouth at bedtime. 11/08/23   Del Wilhelmena Lloyd Sola, FNP  ibuprofen  (ADVIL ) 800 MG tablet Take 1 tablet (800 mg total) by mouth every 8 (eight) hours as needed. 10/02/23   Davies, Melissa, DO  meclizine  (ANTIVERT ) 12.5 MG tablet Take 1 tablet (12.5 mg total) by mouth 3 (three) times daily as needed for dizziness. 11/21/23   Del Wilhelmena Lloyd Sola, FNP  Multiple Vitamin (MULTIVITAMIN) tablet Take 1 tablet by mouth daily.    [provider]  pantoprazole  (PROTONIX ) 40 MG tablet Take 1 tablet (40 mg total) by mouth daily as needed. 01/14/23   Del Wilhelmena Lloyd Sola, FNP  phentermine  37.5 MG capsule Take 1 capsule (37.5 mg total) by mouth every morning. Patient taking differently: Take 37.5 mg by mouth daily. 08/23/23   Del Wilhelmena Lloyd Sola, FNP  Respiratory Therapy Supplies (NEBULIZER MASK ADULT) MISC 1 each by Does not apply route as needed. 04/27/22   Melvenia Manus BRAVO, MD    Allergies: Shellfish allergy and Penicillins    Review of Systems  Gastrointestinal:  Positive for nausea.  Genitourinary:  Positive for flank pain.  All other systems reviewed and are negative.   Updated Vital Signs BP 119/76   Pulse 88   Temp 97.9 F (36.6 C) (Oral)  Resp 17   Ht 5' 6 (1.676 m)   Wt 93.1 kg   LMP 09/25/2023 (Exact Date)   SpO2 100%   BMI 33.13 kg/m   Physical Exam Vitals and nursing note reviewed.  Constitutional:      General: She is not in acute distress.    Appearance: Normal appearance. She is well-developed. She is not ill-appearing, toxic-appearing or diaphoretic.  HENT:     Head: Normocephalic and atraumatic.     Right Ear: External ear normal.     Left Ear: External ear normal.     Nose: Nose normal.     Mouth/Throat:     Mouth: Mucous membranes are moist.  Eyes:     Extraocular Movements:  Extraocular movements intact.     Conjunctiva/sclera: Conjunctivae normal.  Cardiovascular:     Rate and Rhythm: Normal rate and regular rhythm.  Pulmonary:     Effort: Pulmonary effort is normal. No respiratory distress.  Abdominal:     General: Abdomen is flat. There is no distension.  Musculoskeletal:        General: No swelling.     Cervical back: Normal range of motion and neck supple.  Skin:    General: Skin is warm and dry.     Coloration: Skin is not jaundiced or pale.  Neurological:     General: No focal deficit present.     Mental Status: She is alert and oriented to person, place, and time.  Psychiatric:        Mood and Affect: Mood normal.        Behavior: Behavior normal.     (all labs ordered are listed, but only abnormal results are displayed) Labs Reviewed  CBC WITH DIFFERENTIAL/PLATELET - Abnormal; Notable for the following components:      Result Value   Hemoglobin 11.4 (*)    All other components within normal limits  COMPREHENSIVE METABOLIC PANEL WITH GFR - Abnormal; Notable for the following components:   Glucose, Bld 103 (*)    Creatinine, Ser 1.02 (*)    All other components within normal limits  LIPASE, BLOOD  URINALYSIS, ROUTINE W REFLEX MICROSCOPIC    EKG: None  Radiology: CT Renal Stone Study Result Date: 02/25/2024 CLINICAL DATA:  Flank pain. EXAM: CT ABDOMEN AND PELVIS WITHOUT CONTRAST TECHNIQUE: Multidetector CT imaging of the abdomen and pelvis was performed following the standard protocol without IV contrast. RADIATION DOSE REDUCTION: This exam was performed according to the departmental dose-optimization program which includes automated exposure control, adjustment of the mA and/or kV according to patient size and/or use of iterative reconstruction technique. COMPARISON:  September 04, 2005. FINDINGS: Lower chest: No acute abnormality. Hepatobiliary: No focal liver abnormality is seen. No gallstones, gallbladder wall thickening, or biliary  dilatation. Pancreas: Unremarkable. No pancreatic ductal dilatation or surrounding inflammatory changes. Spleen: Normal in size without focal abnormality. Adrenals/Urinary Tract: Adrenal glands appear normal. Mild right hydroureteronephrosis is noted secondary to 2 mm calculus at right ureterovesical junction. Urinary bladder is decompressed. Small nonobstructive left renal calculus is noted. Stomach/Bowel: Stomach is within normal limits. Appendix appears normal. No evidence of bowel wall thickening, distention, or inflammatory changes. Vascular/Lymphatic: No significant vascular findings are present. No enlarged abdominal or pelvic lymph nodes. Reproductive: Status post hysterectomy. No adnexal masses. Other: No abdominal wall hernia or abnormality. No abdominopelvic ascites. Musculoskeletal: No acute or significant osseous findings. IMPRESSION: Mild right hydroureteronephrosis is noted secondary to 2 mm calculus at right ureterovesical junction. Small nonobstructive left renal calculus is  noted. Electronically Signed   By: Lynwood Landy Raddle M.D.   On: 02/25/2024 11:52     Procedures   Medications Ordered in the ED  ondansetron  (ZOFRAN -ODT) disintegrating tablet 4 mg (4 mg Oral Patient Refused/Not Given 02/25/24 1243)  oxyCODONE -acetaminophen  (PERCOCET/ROXICET) 5-325 MG per tablet 1 tablet (1 tablet Oral Given 02/25/24 1240)  ketorolac  (TORADOL ) injection 30 mg (30 mg Intramuscular Given 02/25/24 1241)                                    Medical Decision Making Amount and/or Complexity of Data Reviewed Labs: ordered. Radiology: ordered.  Risk Prescription drug management.   Patient presenting for acute onset of right flank pain late this morning.  Vital signs on arrival are normal.  Workup was initiated prior to patient being bedded in the ED.  Lab work shows no leukocytosis, normal kidney function, normal electrolytes.  Urinalysis does not show evidence of infection.  CT imaging shows a 2 mm right  ureterovesical stone with mild upstream hydroureteronephrosis.  On exam, patient appears uncomfortable.  She endorses ongoing nausea.  Percocet, Toradol , Zofran  were ordered.  On reassessment, patient's pain and nausea have improved.  She was advised to continue multimodal pain control at home and Zofran  as needed.  She was advised to drink plenty of fluids and follow-up with urology.  Patient was discharged in stable condition.     Final diagnoses:  Kidney stone    ED Discharge Orders          Ordered    oxyCODONE -acetaminophen  (PERCOCET/ROXICET) 5-325 MG tablet  Every 8 hours PRN        02/25/24 1359    ondansetron  (ZOFRAN -ODT) 4 MG disintegrating tablet  Every 8 hours PRN        02/25/24 1359               Melvenia Motto, MD 02/25/24 1400

## 2024-02-25 NOTE — ED Notes (Signed)
Pt ambulated to [facility]

## 2024-02-25 NOTE — ED Triage Notes (Signed)
 Pt reports taking 500mg  Tylenol  PTA w/o relief.

## 2024-02-25 NOTE — Discharge Instructions (Addendum)
 You have a 2 mm kidney stone in your right ureter.  Take over-the-counter ibuprofen  and Tylenol  for pain.  If you have persistent pain, a prescription for a narcotic pain medication was sent to your pharmacy.  Take this only as needed.  A separate prescription for a medication called ondansetron  was sent to your pharmacy.  Take this as needed for nausea.  Drink plenty of fluids at home to help pass stone.  Call the telephone number below to set up a follow-up appointment with a urologist.  Return to the emergency department for any new or worsening symptoms of concern.

## 2024-02-28 ENCOUNTER — Encounter: Payer: Self-pay | Admitting: Oncology

## 2024-03-02 ENCOUNTER — Ambulatory Visit (INDEPENDENT_AMBULATORY_CARE_PROVIDER_SITE_OTHER): Admitting: Urology

## 2024-03-02 VITALS — BP 100/67 | HR 112 | Ht 66.0 in | Wt 200.0 lb

## 2024-03-02 DIAGNOSIS — N200 Calculus of kidney: Secondary | ICD-10-CM | POA: Diagnosis not present

## 2024-03-02 DIAGNOSIS — N201 Calculus of ureter: Secondary | ICD-10-CM

## 2024-03-02 LAB — URINALYSIS, ROUTINE W REFLEX MICROSCOPIC
Bilirubin, UA: NEGATIVE
Glucose, UA: NEGATIVE
Ketones, UA: NEGATIVE
Nitrite, UA: NEGATIVE
Protein,UA: NEGATIVE
RBC, UA: NEGATIVE
Specific Gravity, UA: >=1.03 — AB (ref 1.005–1.030)
Urobilinogen, Ur: 0.2 mg/dL (ref 0.2–1.0)
pH, UA: 5.5 (ref 5.0–7.5)

## 2024-03-02 LAB — MICROSCOPIC EXAMINATION

## 2024-03-02 NOTE — Progress Notes (Signed)
 Chief Complaint: Recent right ureteral stone  History of Present Illness:  Martha [name] is a 43 y.o. female who is seen in consultation from [name], Petty, FNP for evaluation of recently passed right ureteral stone.  She presented to the emergency room in [place] on the second of this month with sudden onset right flank pain.  Also had urinary frequency and urgency.  Diagnosed with a small right UVJ stone which passed the next day.  She was found to have a tiny left upper pole stone.  This is her first episode for stone.   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   Hyperlipidemia, mixed    Iron  deficiency anemia due to chronic blood loss    hematology/ oncology--- [facility],  treated w/ iv iron ,  last infusion 06-17-2023   Mild persistent asthma    Pre-diabetes    Spinal arachnoid cyst 2020   mild intradural spinal cord flattening @ T2-3 intradural  (per pt followed by neuro surgeon, asymptomatic)   Vitamin D  deficiency    Wears glasses     Past Surgical History:  Past Surgical History:  Procedure Laterality Date   ABDOMINAL HYSTERECTOMY  10/02/2023   Partial   BIOPSY  03/30/2020   Procedure: BIOPSY;  Surgeon: [name], MD;  Location: [facility];  Service: Endoscopy;;   COLONOSCOPY N/A 03/30/2020   normal colon, normal TI.    DIAGNOSTIC LAPAROSCOPY  09/09/2001   @[facility] ;  AND D&C HYSTEROSCOPY   DILATATION & CURETTAGE/HYSTEROSCOPY WITH MYOSURE N/A 01/31/2022   Procedure: DILATATION & CURETTAGE/HYSTEROSCOPY WITH MYOSURE;  Surgeon: [name], DO;  Location: [facility];  Service: Gynecology;  Laterality: N/A;   ESOPHAGOGASTRODUODENOSCOPY  01/2010   Dr. shaaron: small hiatal hernia   ESOPHAGOGASTRODUODENOSCOPY N/A 03/30/2020   mild  erosive reflux esophagitis, medium-sized hiatal hernia s/p biopsy, normal duodenum. Schatzki's ring. Negative H.pylori.    LASER ABLATION OF THE CERVIX  12/14/2003   @[facility]   (CO2 laser vaporization of CIN)   ROBOTIC ASSISTED LAPAROSCOPIC HYSTERECTOMY AND SALPINGECTOMY Bilateral 10/02/2023   Procedure: XI ROBOTIC ASSISTED LAPAROSCOPIC HYSTERECTOMY AND BILATERAL SALPINGECTOMY;  Surgeon: [name], DO;  Location: [facility];  Service: Gynecology;  Laterality: Bilateral;   TUBAL LIGATION Bilateral 06/26/2015   Procedure: POST PARTUM TUBAL LIGATION;  Surgeon: [name], MD;  Location: [facility];  Service: Gynecology;  Laterality: Bilateral;   WRIST GANGLION EXCISION  07/22/2009   @[facility]     Allergies:  Allergies  Allergen Reactions   Shellfish Allergy Itching   Penicillins Diarrhea and Nausea And Vomiting    Has patient had a PCN reaction causing immediate rash, facial/tongue/throat swelling, SOB or lightheadedness with hypotension: No Has patient had a PCN reaction causing severe rash involving mucus membranes or skin necrosis: No Has patient had a PCN reaction that required hospitalization No Has patient had a PCN reaction occurring within the last 10 years: Yes If all of the above answers are NO, then may proceed with Cephalosporin use.     Family History:  Family History  Problem Relation Age of Onset   Asthma Mother    Allergies Mother    Diabetes Mother        prediabetes   Myasthenia gravis Father    Bladder  Cancer Father        diagnosed at 47   Hyperlipidemia Father    Migraines Sister    Cancer Maternal Aunt        facial   Diabetes Paternal Uncle    Cancer Maternal Grandmother        stomach   Heart disease Paternal Grandmother    Heart disease Paternal Grandfather    Colon cancer Neg Hx    Inflammatory bowel disease Neg Hx    Celiac disease Neg Hx    Breast cancer Neg Hx    Lung cancer Neg Hx     Social History:  Social History   Tobacco Use   Smoking  status: Never   Smokeless tobacco: Never  Vaping Use   Vaping status: Never Used  Substance Use Topics   Alcohol use: Yes    Comment: socially   Drug use: Never    Review of symptoms:  Constitutional:  Negative for unexplained weight loss, night sweats, fever, chills ENT:  Negative for nose bleeds, sinus pain, painful swallowing CV:  Negative for chest pain, shortness of breath, exercise intolerance, palpitations, loss of consciousness Resp:  Negative for cough, wheezing, shortness of breath GI:  Negative for nausea, vomiting, diarrhea, bloody stools GU:  Positives noted in HPI; otherwise negative for gross hematuria, dysuria, urinary incontinence Neuro:  Negative for seizures, poor balance, limb weakness, slurred speech Psych:  Negative for lack of energy, depression, anxiety Endocrine:  Negative for polydipsia, polyuria, symptoms of hypoglycemia (dizziness, hunger, sweating) Hematologic:  Negative for anemia, purpura, petechia, prolonged or excessive bleeding, use of anticoagulants  Allergic:  Negative for difficulty breathing or choking as a result of exposure to anything; no shellfish allergy; no allergic response (rash/itch) to materials, foods  Physical exam: LMP 09/25/2023 (Exact Date)  GENERAL APPEARANCE:  Well appearing, well developed, well nourished, NAD HEENT: Atraumatic, Normocephalic. NECK: Normal appearance LUNGS: Normal inspiratory and expiratory excursion HEART: Regular Rate EXTREMITIES: Moves all extremities well.  Without clubbing, cyanosis, or edema. NEUROLOGIC:  Alert and oriented x 3, normal gait, CN II-XII grossly intact.  MENTAL STATUS:  Appropriate. SKIN:  Warm, dry and intact.    Results:  I have reviewed referring/prior physicians records  I have reviewed urinalysis--clear today  Recent CMP reviewed--Ca++9.2  I have reviewed prior urine cultures  I reviewed prior imaging studies--images reviewed with pt.  Results: Mild right  hydroureteronephrosis is noted secondary to 2 mm calculus at right ureterovesical junction. Small nonobstructive left renal calculus is noted.  Assessment: 1.  Right ureteral stone, passed  2.  Tiny left renal stone   Plan: 1.  Dietary modification discussed to prevent further stone formation/growth  2.  Reassurance about her tiny left stone-she will return as needed

## 2024-03-05 ENCOUNTER — Encounter: Payer: Self-pay | Admitting: Urology

## 2024-03-10 ENCOUNTER — Telehealth: Payer: Self-pay

## 2024-03-10 NOTE — Telephone Encounter (Signed)
 Spoke with pt in reference to her MyChart message. Pt stated that she saw the mucous in her ua in MyChart. Reinforced with pt this is completely normal, esp for women. Pt denied any UTI s/s. Reinforced with pt no abx were needed. Pt voiced understanding.

## 2024-07-21 ENCOUNTER — Ambulatory Visit: Payer: Self-pay

## 2024-07-21 NOTE — Telephone Encounter (Signed)
 Noted. Ed advised

## 2024-07-21 NOTE — Telephone Encounter (Signed)
 FYI Only or Action Required?: FYI only for provider: ED advised.  Patient was last seen in primary care on 11/21/2023 by Terry Wilhelmena Lloyd Hilario, FNP.  Called Nurse Triage reporting Blurred Vision and Dizziness.  Symptoms began several days ago.  Interventions attempted: Rest, hydration, or home remedies.  Symptoms are: vision resolved at this time, other symptoms persisting.  Triage Disposition: Go to ED Now (or PCP Triage)  Patient/caregiver understands and will follow disposition?: Yes      Reason for Disposition  Patient sounds very sick or weak to the triager  Answer Assessment - Initial Assessment Questions This RN recommended pt be examined in hospital asap. Pt verbalized understanding, will head to ER via loved one. Advised call 911 if any new or worsening symptoms.     1. DESCRIPTION: How has your vision changed? (e.g., complete vision loss, blurred vision, double vision, floaters, etc.)     Felt like a film was over her vision and could not get it clean Knew where I was and all just could not see  2. LOCATION: One or both eyes? If one, ask: Which eye?     Both eyes  4. ONSET: When did this begin? Did it start suddenly or has this been gradual?     Few days ago sudden onset  5. PATTERN: Does this come and go, or has it been constant since it started?     Went away  6. PAIN: Is there any pain in your eye(s)?  (Scale 1-10; or mild, moderate, severe)     Denies  7. CONTACTS-GLASSES: Do you wear contacts or glasses?     Glasses, wasn't glasses  8. CAUSE: What do you think is causing this visual problem?     Had school nurse check her BP which was fine Not sure if my sugar Have anemia  9. OTHER SYMPTOMS: Do you have any other symptoms? (e.g., confusion, headache, arm or leg weakness, speech problems)     Waking up with headaches more frequently A little bit having to catch breath more Heart flutters, makes me have to catch breath  sometimes Was sick recently, had to use inhaler quite a few times, not in past few days Often get dizzy, been dealing with it since anemia, dizzy or lightheaded upon standing - confirms been more frequent/intense lately  Denies: Altered mental status then or now Weakness/numbness Cold/clammy skin, heart racing Struggling to breathe Chest pain Complete loss of vision in one or both eyes Severe headache Current blurred vision Double vision Head injury  Protocols used: Vision Loss or Change-A-AH

## 2024-07-23 ENCOUNTER — Telehealth: Payer: Self-pay

## 2024-07-23 ENCOUNTER — Ambulatory Visit: Admitting: Family Medicine

## 2024-07-23 DIAGNOSIS — D509 Iron deficiency anemia, unspecified: Secondary | ICD-10-CM

## 2024-07-23 DIAGNOSIS — R7303 Prediabetes: Secondary | ICD-10-CM

## 2024-07-23 DIAGNOSIS — Z1329 Encounter for screening for other suspected endocrine disorder: Secondary | ICD-10-CM

## 2024-07-23 DIAGNOSIS — E559 Vitamin D deficiency, unspecified: Secondary | ICD-10-CM

## 2024-07-23 DIAGNOSIS — E785 Hyperlipidemia, unspecified: Secondary | ICD-10-CM

## 2024-07-23 DIAGNOSIS — K2100 Gastro-esophageal reflux disease with esophagitis, without bleeding: Secondary | ICD-10-CM

## 2024-07-23 NOTE — Telephone Encounter (Signed)
 Pt has a video appointment today to request labs. She is requesting lipid, cmp, A1c. Is this something that can be be ordered without a visit or does pt need to keep appointment this am to discuss? Please advise

## 2024-07-23 NOTE — Telephone Encounter (Signed)
 Labs ordered, pt informed

## 2024-07-23 NOTE — Telephone Encounter (Signed)
 error

## 2024-07-23 NOTE — Telephone Encounter (Signed)
 Pls order CBC, fasting lipid, cmp and eGFr, HBA1C, TSH and vit D, advise we will reschedule visit, will need the visit aFTER labs to discuss results  Let her know I have added additional labs to what she requested

## 2024-07-24 ENCOUNTER — Ambulatory Visit: Payer: Self-pay | Admitting: Family Medicine

## 2024-07-24 LAB — LIPID PANEL
Chol/HDL Ratio: 3.8 ratio (ref 0.0–4.4)
Cholesterol, Total: 196 mg/dL (ref 100–199)
HDL: 51 mg/dL
LDL Chol Calc (NIH): 129 mg/dL — ABNORMAL HIGH (ref 0–99)
Triglycerides: 86 mg/dL (ref 0–149)
VLDL Cholesterol Cal: 16 mg/dL (ref 5–40)

## 2024-07-24 LAB — CBC WITH DIFFERENTIAL/PLATELET
Basophils Absolute: 0 10*3/uL (ref 0.0–0.2)
Basos: 0 %
EOS (ABSOLUTE): 0.1 10*3/uL (ref 0.0–0.4)
Eos: 1 %
Hematocrit: 37.9 % (ref 34.0–46.6)
Hemoglobin: 11.8 g/dL (ref 11.1–15.9)
Immature Grans (Abs): 0 10*3/uL (ref 0.0–0.1)
Immature Granulocytes: 0 %
Lymphocytes Absolute: 2 10*3/uL (ref 0.7–3.1)
Lymphs: 27 %
MCH: 26.1 pg — ABNORMAL LOW (ref 26.6–33.0)
MCHC: 31.1 g/dL — ABNORMAL LOW (ref 31.5–35.7)
MCV: 84 fL (ref 79–97)
Monocytes Absolute: 0.6 10*3/uL (ref 0.1–0.9)
Monocytes: 8 %
Neutrophils Absolute: 4.6 10*3/uL (ref 1.4–7.0)
Neutrophils: 64 %
Platelets: 232 10*3/uL (ref 150–450)
RBC: 4.52 x10E6/uL (ref 3.77–5.28)
RDW: 13.2 % (ref 11.7–15.4)
WBC: 7.3 10*3/uL (ref 3.4–10.8)

## 2024-07-24 LAB — CMP14+EGFR
ALT: 17 [IU]/L (ref 0–32)
AST: 12 [IU]/L (ref 0–40)
Albumin: 4.2 g/dL (ref 3.9–4.9)
Alkaline Phosphatase: 94 [IU]/L (ref 41–116)
BUN/Creatinine Ratio: 12 (ref 9–23)
BUN: 10 mg/dL (ref 6–24)
Bilirubin Total: 0.3 mg/dL (ref 0.0–1.2)
CO2: 21 mmol/L (ref 20–29)
Calcium: 9.1 mg/dL (ref 8.7–10.2)
Chloride: 104 mmol/L (ref 96–106)
Creatinine, Ser: 0.86 mg/dL (ref 0.57–1.00)
Globulin, Total: 2.7 g/dL (ref 1.5–4.5)
Glucose: 81 mg/dL (ref 70–99)
Potassium: 4.5 mmol/L (ref 3.5–5.2)
Sodium: 138 mmol/L (ref 134–144)
Total Protein: 6.9 g/dL (ref 6.0–8.5)
eGFR: 86 mL/min/{1.73_m2}

## 2024-07-24 LAB — HEMOGLOBIN A1C
Est. average glucose Bld gHb Est-mCnc: 111 mg/dL
Hgb A1c MFr Bld: 5.5 % (ref 4.8–5.6)

## 2024-07-24 LAB — VITAMIN D 25 HYDROXY (VIT D DEFICIENCY, FRACTURES): Vit D, 25-Hydroxy: 24.6 ng/mL — ABNORMAL LOW (ref 30.0–100.0)

## 2024-07-24 LAB — TSH: TSH: 2.32 u[IU]/mL (ref 0.450–4.500)

## 2024-07-24 NOTE — Telephone Encounter (Signed)
 scheduled

## 2024-09-25 ENCOUNTER — Ambulatory Visit: Payer: Self-pay | Admitting: Family Medicine
